# Patient Record
Sex: Male | Born: 1945 | Race: Black or African American | Hispanic: No | Marital: Married | Smoking: Heavy tobacco smoker
Health system: Southern US, Community
[De-identification: ages and names within clinical notes are randomized; demographics above are authoritative.]

## PROBLEM LIST (undated history)

## (undated) DIAGNOSIS — I428 Other cardiomyopathies: Secondary | ICD-10-CM

## (undated) DIAGNOSIS — I519 Heart disease, unspecified: Secondary | ICD-10-CM

## (undated) DIAGNOSIS — I509 Heart failure, unspecified: Secondary | ICD-10-CM

## (undated) DIAGNOSIS — I4729 Other ventricular tachycardia: Secondary | ICD-10-CM

## (undated) DIAGNOSIS — I472 Ventricular tachycardia: Secondary | ICD-10-CM

## (undated) HISTORY — DX: Heart disease, unspecified: I51.9

## (undated) HISTORY — PX: ANKLE FRACTURE SURGERY: SHX122

## (undated) HISTORY — DX: Other ventricular tachycardia: I47.29

## (undated) HISTORY — DX: Other cardiomyopathies: I42.8

## (undated) HISTORY — DX: Ventricular tachycardia: I47.2

## (undated) HISTORY — PX: KNEE SURGERY: SHX244

---

## 2009-07-10 ENCOUNTER — Emergency Department (HOSPITAL_COMMUNITY): Admission: EM | Admit: 2009-07-10 | Discharge: 2009-07-10 | Payer: Self-pay | Admitting: Family Medicine

## 2009-10-21 ENCOUNTER — Emergency Department (HOSPITAL_COMMUNITY): Admission: EM | Admit: 2009-10-21 | Discharge: 2009-10-21 | Payer: Self-pay | Admitting: Emergency Medicine

## 2010-09-23 LAB — POCT I-STAT, CHEM 8
Calcium, Ion: 1.1 mmol/L — ABNORMAL LOW (ref 1.12–1.32)
Chloride: 98 mEq/L (ref 96–112)
Creatinine, Ser: 0.9 mg/dL (ref 0.4–1.5)
Glucose, Bld: 123 mg/dL — ABNORMAL HIGH (ref 70–99)
HCT: 43 % (ref 39.0–52.0)
Hemoglobin: 14.6 g/dL (ref 13.0–17.0)
Potassium: 4.2 mEq/L (ref 3.5–5.1)

## 2010-09-23 LAB — URINALYSIS, ROUTINE W REFLEX MICROSCOPIC
Nitrite: NEGATIVE
Protein, ur: NEGATIVE mg/dL
Specific Gravity, Urine: 1.01 (ref 1.005–1.030)
Urobilinogen, UA: 0.2 mg/dL (ref 0.0–1.0)

## 2010-09-23 LAB — URINE MICROSCOPIC-ADD ON

## 2010-10-28 ENCOUNTER — Emergency Department (HOSPITAL_COMMUNITY)
Admission: EM | Admit: 2010-10-28 | Discharge: 2010-10-28 | Disposition: A | Discharge status: Home / Self Care | Payer: No Typology Code available for payment source | Attending: Emergency Medicine | Admitting: Emergency Medicine

## 2010-10-28 DIAGNOSIS — S20219A Contusion of unspecified front wall of thorax, initial encounter: Secondary | ICD-10-CM | POA: Insufficient documentation

## 2010-10-28 DIAGNOSIS — IMO0002 Reserved for concepts with insufficient information to code with codable children: Secondary | ICD-10-CM | POA: Insufficient documentation

## 2010-10-28 DIAGNOSIS — K219 Gastro-esophageal reflux disease without esophagitis: Secondary | ICD-10-CM | POA: Insufficient documentation

## 2010-10-28 DIAGNOSIS — R079 Chest pain, unspecified: Secondary | ICD-10-CM | POA: Insufficient documentation

## 2011-08-09 ENCOUNTER — Emergency Department (HOSPITAL_COMMUNITY)
Admission: EM | Admit: 2011-08-09 | Discharge: 2011-08-09 | Disposition: A | Discharge status: Home / Self Care | Payer: Medicare Other | Attending: Emergency Medicine | Admitting: Emergency Medicine

## 2011-08-09 ENCOUNTER — Encounter (HOSPITAL_COMMUNITY): Payer: Self-pay | Admitting: Emergency Medicine

## 2011-08-09 DIAGNOSIS — R300 Dysuria: Secondary | ICD-10-CM | POA: Diagnosis not present

## 2011-08-09 DIAGNOSIS — R399 Unspecified symptoms and signs involving the genitourinary system: Secondary | ICD-10-CM | POA: Insufficient documentation

## 2011-08-09 DIAGNOSIS — R339 Retention of urine, unspecified: Secondary | ICD-10-CM | POA: Insufficient documentation

## 2011-08-09 DIAGNOSIS — R109 Unspecified abdominal pain: Secondary | ICD-10-CM | POA: Diagnosis not present

## 2011-08-09 LAB — URINALYSIS, ROUTINE W REFLEX MICROSCOPIC
Bilirubin Urine: NEGATIVE
Hgb urine dipstick: NEGATIVE
Ketones, ur: NEGATIVE mg/dL
Nitrite: NEGATIVE
Specific Gravity, Urine: 1.02 (ref 1.005–1.030)
pH: 6 (ref 5.0–8.0)

## 2011-08-09 MED ORDER — TAMSULOSIN HCL 0.4 MG PO CAPS: 0.4000 mg | ORAL_CAPSULE | Freq: Every day | ORAL | Status: DC

## 2011-08-09 NOTE — ED Provider Notes (Signed)
History     CSN: [id number]  Arrival date & time 08/09/11  0650   First MD Initiated Contact with Patient 08/09/11 [phone]      Chief Complaint  Patient presents with  . Urinary Incontinence    (Consider location/radiation/quality/duration/timing/severity/associated sxs/prior treatment) The history is provided by the patient.   the patient is a 41, male, with no past medical history, who takes no medications.  He presents to emergency department complaining of insidious onset of decreased urinary output.  He also has dysuria.  He says he has decreased force of stream.  He denies nausea, vomiting, fevers, chills, or hematuria.  He denies back pain, or weakness in his legs.  Past Medical History  Diagnosis Date  . Urinary incontinence     Past Surgical History  Procedure Date  . Knee surgery   . Ankle fracture surgery     History reviewed. No pertinent family history.  History  Substance Use Topics  . Smoking status: Current Everyday Smoker  . Smokeless tobacco: Not on file  . Alcohol Use: No      Review of Systems  Constitutional: Negative for fever and chills.  Gastrointestinal: Negative for nausea, vomiting and abdominal pain.  Genitourinary: Positive for dysuria and decreased urine volume. Negative for hematuria.  Musculoskeletal: Negative for back pain.  Neurological: Negative for weakness and numbness.  Psychiatric/Behavioral: Negative for confusion.  All other systems reviewed and are negative.    Allergies  Review of patient's allergies indicates no known allergies.  Home Medications  No current outpatient prescriptions on file.  BP 170/84  Pulse 77  Temp(Src) 98.4 F (36.9 C) (Oral)  Resp 16  SpO2 98%  Physical Exam  Vitals reviewed. Constitutional: He is oriented to person, place, and time. He appears well-developed and well-nourished.  HENT:  Head: Normocephalic.  Eyes: Conjunctivae are normal. Pupils are equal, round, and reactive to light.    Neck: Normal range of motion. Neck supple.  Pulmonary/Chest: Effort normal.  Abdominal: Soft. He exhibits no distension. There is tenderness. There is no rebound and no guarding.       Mild suprapubic tenderness  Musculoskeletal: Normal range of motion. He exhibits no edema.  Neurological: He is alert and oriented to person, place, and time.  Skin: Skin is warm and dry.  Psychiatric: He has a normal mood and affect.    ED Course  Procedures (including critical care time) 66 year old male, with no past medical history presents with decreased urine output, decreased force of urinary stream, and dysuria.  I suspect that he had a urinary tract infection or BPH.  We will perform urine analysis and catheterize him to see if there is any urinary retention.   Labs Reviewed  URINALYSIS, ROUTINE W REFLEX MICROSCOPIC   No results found.   No diagnosis found.  Foley > 1000 ml UOP Will convert to leg bag and refer to urology   MDM  Urinary retention No uti. sxs relieved in ed.        Nicholes Stairs, MD 08/09/11 [phone]

## 2011-08-09 NOTE — ED Notes (Signed)
Patient complaining of urinary incontinence that started yesterday.  Patient states that he had a similar incidence around two years ago; does not remember the cause/diagnosis.  Patient reports burning during urination.  Denies any problems with prostate or past history of UTIs.

## 2011-08-09 NOTE — ED Notes (Signed)
Family at bedside. 

## 2011-08-09 NOTE — ED Notes (Signed)
Pt c/o urinary incontinence and burning w/urination starting yesterday at 12p, pt c/o lower bad pain and distended abd, sts "I just feel full."

## 2011-08-17 DIAGNOSIS — N402 Nodular prostate without lower urinary tract symptoms: Secondary | ICD-10-CM | POA: Diagnosis not present

## 2011-08-17 DIAGNOSIS — N400 Enlarged prostate without lower urinary tract symptoms: Secondary | ICD-10-CM | POA: Diagnosis not present

## 2011-08-17 DIAGNOSIS — R972 Elevated prostate specific antigen [PSA]: Secondary | ICD-10-CM | POA: Diagnosis not present

## 2011-08-17 DIAGNOSIS — R339 Retention of urine, unspecified: Secondary | ICD-10-CM | POA: Diagnosis not present

## 2011-09-11 DIAGNOSIS — R972 Elevated prostate specific antigen [PSA]: Secondary | ICD-10-CM | POA: Diagnosis not present

## 2011-09-11 DIAGNOSIS — N400 Enlarged prostate without lower urinary tract symptoms: Secondary | ICD-10-CM | POA: Diagnosis not present

## 2011-09-11 DIAGNOSIS — R339 Retention of urine, unspecified: Secondary | ICD-10-CM | POA: Diagnosis not present

## 2011-09-11 DIAGNOSIS — N402 Nodular prostate without lower urinary tract symptoms: Secondary | ICD-10-CM | POA: Diagnosis not present

## 2011-09-11 DIAGNOSIS — N320 Bladder-neck obstruction: Secondary | ICD-10-CM | POA: Diagnosis not present

## 2012-01-11 DIAGNOSIS — R972 Elevated prostate specific antigen [PSA]: Secondary | ICD-10-CM | POA: Diagnosis not present

## 2012-01-11 DIAGNOSIS — R339 Retention of urine, unspecified: Secondary | ICD-10-CM | POA: Diagnosis not present

## 2012-01-11 DIAGNOSIS — N400 Enlarged prostate without lower urinary tract symptoms: Secondary | ICD-10-CM | POA: Diagnosis not present

## 2013-03-31 DIAGNOSIS — N402 Nodular prostate without lower urinary tract symptoms: Secondary | ICD-10-CM | POA: Diagnosis not present

## 2013-03-31 DIAGNOSIS — R972 Elevated prostate specific antigen [PSA]: Secondary | ICD-10-CM | POA: Diagnosis not present

## 2013-03-31 DIAGNOSIS — N400 Enlarged prostate without lower urinary tract symptoms: Secondary | ICD-10-CM | POA: Diagnosis not present

## 2014-07-25 DIAGNOSIS — R972 Elevated prostate specific antigen [PSA]: Secondary | ICD-10-CM | POA: Diagnosis not present

## 2014-07-25 DIAGNOSIS — N401 Enlarged prostate with lower urinary tract symptoms: Secondary | ICD-10-CM | POA: Diagnosis not present

## 2014-07-25 DIAGNOSIS — R3912 Poor urinary stream: Secondary | ICD-10-CM | POA: Diagnosis not present

## 2014-07-25 DIAGNOSIS — N400 Enlarged prostate without lower urinary tract symptoms: Secondary | ICD-10-CM | POA: Diagnosis not present

## 2014-09-03 ENCOUNTER — Other Ambulatory Visit (HOSPITAL_COMMUNITY): Payer: Self-pay

## 2014-09-03 ENCOUNTER — Inpatient Hospital Stay (HOSPITAL_COMMUNITY): Payer: Medicare Other

## 2014-09-03 ENCOUNTER — Inpatient Hospital Stay (HOSPITAL_COMMUNITY)
Admission: EM | Admit: 2014-09-03 | Discharge: 2014-09-07 | DRG: 280 | Disposition: A | Discharge status: Home / Self Care | Payer: Medicare Other | Attending: Cardiology | Admitting: Cardiology

## 2014-09-03 ENCOUNTER — Emergency Department (HOSPITAL_COMMUNITY): Payer: Medicare Other

## 2014-09-03 ENCOUNTER — Encounter (HOSPITAL_COMMUNITY): Payer: Self-pay | Admitting: Emergency Medicine

## 2014-09-03 DIAGNOSIS — Z720 Tobacco use: Secondary | ICD-10-CM | POA: Diagnosis present

## 2014-09-03 DIAGNOSIS — I420 Dilated cardiomyopathy: Secondary | ICD-10-CM

## 2014-09-03 DIAGNOSIS — E785 Hyperlipidemia, unspecified: Secondary | ICD-10-CM | POA: Diagnosis present

## 2014-09-03 DIAGNOSIS — I472 Ventricular tachycardia: Secondary | ICD-10-CM | POA: Diagnosis present

## 2014-09-03 DIAGNOSIS — R32 Unspecified urinary incontinence: Secondary | ICD-10-CM | POA: Diagnosis present

## 2014-09-03 DIAGNOSIS — R0602 Shortness of breath: Secondary | ICD-10-CM | POA: Diagnosis not present

## 2014-09-03 DIAGNOSIS — I10 Essential (primary) hypertension: Secondary | ICD-10-CM | POA: Diagnosis present

## 2014-09-03 DIAGNOSIS — F17210 Nicotine dependence, cigarettes, uncomplicated: Secondary | ICD-10-CM | POA: Diagnosis present

## 2014-09-03 DIAGNOSIS — I214 Non-ST elevation (NSTEMI) myocardial infarction: Principal | ICD-10-CM | POA: Diagnosis present

## 2014-09-03 DIAGNOSIS — Z91041 Radiographic dye allergy status: Secondary | ICD-10-CM

## 2014-09-03 DIAGNOSIS — R7989 Other specified abnormal findings of blood chemistry: Secondary | ICD-10-CM

## 2014-09-03 DIAGNOSIS — Z7982 Long term (current) use of aspirin: Secondary | ICD-10-CM

## 2014-09-03 DIAGNOSIS — I5021 Acute systolic (congestive) heart failure: Secondary | ICD-10-CM | POA: Diagnosis present

## 2014-09-03 DIAGNOSIS — R778 Other specified abnormalities of plasma proteins: Secondary | ICD-10-CM | POA: Diagnosis present

## 2014-09-03 DIAGNOSIS — R200 Anesthesia of skin: Secondary | ICD-10-CM | POA: Diagnosis not present

## 2014-09-03 DIAGNOSIS — I409 Acute myocarditis, unspecified: Secondary | ICD-10-CM | POA: Diagnosis present

## 2014-09-03 DIAGNOSIS — I429 Cardiomyopathy, unspecified: Secondary | ICD-10-CM | POA: Diagnosis not present

## 2014-09-03 DIAGNOSIS — J9811 Atelectasis: Secondary | ICD-10-CM | POA: Diagnosis not present

## 2014-09-03 DIAGNOSIS — I493 Ventricular premature depolarization: Secondary | ICD-10-CM | POA: Diagnosis present

## 2014-09-03 DIAGNOSIS — R42 Dizziness and giddiness: Secondary | ICD-10-CM | POA: Diagnosis not present

## 2014-09-03 DIAGNOSIS — R0600 Dyspnea, unspecified: Secondary | ICD-10-CM | POA: Diagnosis not present

## 2014-09-03 LAB — CBC
HEMATOCRIT: 37.9 % — AB (ref 39.0–52.0)
Hemoglobin: 12.6 g/dL — ABNORMAL LOW (ref 13.0–17.0)
MCH: 28.7 pg (ref 26.0–34.0)
MCHC: 33.2 g/dL (ref 30.0–36.0)
MCV: 86.3 fL (ref 78.0–100.0)
PLATELETS: 175 10*3/uL (ref 150–400)
RBC: 4.39 MIL/uL (ref 4.22–5.81)
RDW: 13.5 % (ref 11.5–15.5)
WBC: 5.6 10*3/uL (ref 4.0–10.5)

## 2014-09-03 LAB — DIFFERENTIAL
BASOS ABS: 0 10*3/uL (ref 0.0–0.1)
Basophils Relative: 1 % (ref 0–1)
EOS PCT: 2 % (ref 0–5)
Eosinophils Absolute: 0.1 10*3/uL (ref 0.0–0.7)
LYMPHS PCT: 27 % (ref 12–46)
Lymphs Abs: 1.5 10*3/uL (ref 0.7–4.0)
MONO ABS: 0.5 10*3/uL (ref 0.1–1.0)
MONOS PCT: 9 % (ref 3–12)
NEUTROS ABS: 3.4 10*3/uL (ref 1.7–7.7)
Neutrophils Relative %: 61 % (ref 43–77)

## 2014-09-03 LAB — COMPREHENSIVE METABOLIC PANEL
ALK PHOS: 67 U/L (ref 39–117)
ALT: 13 U/L (ref 0–53)
AST: 22 U/L (ref 0–37)
Albumin: 3.3 g/dL — ABNORMAL LOW (ref 3.5–5.2)
Anion gap: 8 (ref 5–15)
BUN: 11 mg/dL (ref 6–23)
CALCIUM: 8.6 mg/dL (ref 8.4–10.5)
CHLORIDE: 110 mmol/L (ref 96–112)
CO2: 21 mmol/L (ref 19–32)
Creatinine, Ser: 1 mg/dL (ref 0.50–1.35)
GFR calc Af Amer: 87 mL/min — ABNORMAL LOW (ref >90)
GFR, EST NON AFRICAN AMERICAN: 75 mL/min — AB (ref >90)
GLUCOSE: 135 mg/dL — AB (ref 70–99)
Potassium: 3.7 mmol/L (ref 3.5–5.1)
Sodium: 139 mmol/L (ref 135–145)
Total Bilirubin: 0.8 mg/dL (ref 0.3–1.2)
Total Protein: 6.9 g/dL (ref 6.0–8.3)

## 2014-09-03 LAB — I-STAT TROPONIN, ED: Troponin i, poc: 1.43 ng/mL (ref 0.00–0.08)

## 2014-09-03 LAB — PROTIME-INR
INR: 1.01 (ref 0.00–1.49)
PROTHROMBIN TIME: 13.4 s (ref 11.6–15.2)

## 2014-09-03 LAB — CBG MONITORING, ED
GLUCOSE-CAPILLARY: 128 mg/dL — AB (ref 70–99)
Glucose-Capillary: 163 mg/dL — ABNORMAL HIGH (ref 70–99)

## 2014-09-03 LAB — APTT: APTT: 29 s (ref 24–37)

## 2014-09-03 MED ORDER — ASPIRIN 81 MG PO CHEW
324.0000 mg | CHEWABLE_TABLET | Freq: Once | ORAL | Status: AC
  Administered 2014-09-03: 324 mg via ORAL
  Filled 2014-09-03: qty 4

## 2014-09-03 MED ORDER — ASPIRIN 300 MG RE SUPP
300.0000 mg | RECTAL | Status: AC
  Filled 2014-09-03: qty 1

## 2014-09-03 MED ORDER — ASPIRIN EC 81 MG PO TBEC
81.0000 mg | DELAYED_RELEASE_TABLET | Freq: Every day | ORAL | Status: DC
  Administered 2014-09-04 – 2014-09-06 (×3): 81 mg via ORAL
  Filled 2014-09-03 (×4): qty 1

## 2014-09-03 MED ORDER — HEPARIN (PORCINE) IN NACL 100-0.45 UNIT/ML-% IJ SOLN
1400.0000 [IU]/h | INTRAMUSCULAR | Status: DC
  Administered 2014-09-03: 950 [IU]/h via INTRAVENOUS
  Filled 2014-09-03 (×3): qty 250

## 2014-09-03 MED ORDER — FINASTERIDE 5 MG PO TABS
5.0000 mg | ORAL_TABLET | Freq: Every day | ORAL | Status: DC
  Administered 2014-09-04 – 2014-09-07 (×4): 5 mg via ORAL
  Filled 2014-09-03 (×4): qty 1

## 2014-09-03 MED ORDER — ONDANSETRON HCL 4 MG/2ML IJ SOLN
4.0000 mg | Freq: Once | INTRAMUSCULAR | Status: AC
  Administered 2014-09-03: 4 mg via INTRAVENOUS
  Filled 2014-09-03: qty 2

## 2014-09-03 MED ORDER — ONDANSETRON HCL 4 MG/2ML IJ SOLN
4.0000 mg | Freq: Four times a day (QID) | INTRAMUSCULAR | Status: DC | PRN
  Administered 2014-09-04: 4 mg via INTRAVENOUS
  Filled 2014-09-03: qty 2

## 2014-09-03 MED ORDER — NITROGLYCERIN 0.4 MG SL SUBL: 0.4000 mg | SUBLINGUAL_TABLET | SUBLINGUAL | Status: DC | PRN

## 2014-09-03 MED ORDER — NICOTINE 21 MG/24HR TD PT24
21.0000 mg | MEDICATED_PATCH | Freq: Every day | TRANSDERMAL | Status: DC
  Administered 2014-09-04 – 2014-09-06 (×3): 21 mg via TRANSDERMAL
  Filled 2014-09-03 (×4): qty 1

## 2014-09-03 MED ORDER — METOPROLOL TARTRATE 12.5 MG HALF TABLET
12.5000 mg | ORAL_TABLET | Freq: Two times a day (BID) | ORAL | Status: DC
  Administered 2014-09-04: 12.5 mg via ORAL
  Filled 2014-09-03 (×3): qty 1

## 2014-09-03 MED ORDER — ATORVASTATIN CALCIUM 80 MG PO TABS
80.0000 mg | ORAL_TABLET | Freq: Every day | ORAL | Status: DC
  Administered 2014-09-05 – 2014-09-06 (×2): 80 mg via ORAL
  Filled 2014-09-03 (×4): qty 1

## 2014-09-03 MED ORDER — HEPARIN BOLUS VIA INFUSION
4000.0000 [IU] | Freq: Once | INTRAVENOUS | Status: AC
  Administered 2014-09-03: 4000 [IU] via INTRAVENOUS
  Filled 2014-09-03: qty 4000

## 2014-09-03 MED ORDER — ACETAMINOPHEN 325 MG PO TABS: 650.0000 mg | ORAL_TABLET | ORAL | Status: DC | PRN

## 2014-09-03 MED ORDER — ASPIRIN 81 MG PO CHEW: 324.0000 mg | CHEWABLE_TABLET | ORAL | Status: AC

## 2014-09-03 NOTE — ED Notes (Signed)
Cardiologist at bedside.  

## 2014-09-03 NOTE — ED Notes (Signed)
EKG hand delivered to Dr. Vanita Panda and this RN informed him of the patients critical troponin of 1.43.

## 2014-09-03 NOTE — ED Notes (Signed)
Critical troponin results: 1.43

## 2014-09-03 NOTE — Progress Notes (Signed)
ANTICOAGULATION CONSULT NOTE - Initial Consult  Pharmacy Consult for heparin Indication: chest pain/ACS  No Known Allergies  Patient Measurements: Height: 5\' 7"  (170.2 cm) Weight: 175 lb (79.379 kg) IBW/kg (Calculated) : 66.1 Heparin Dosing Weight: 79 kg  Vital Signs: Temp: 97.8 F (36.6 C) (02/29 2210) Temp Source: Oral (02/29 2104) BP: 142/86 mmHg (02/29 2104) Pulse Rate: 96 (02/29 2104)  Labs:  Recent Labs  09/03/14 2104  HGB 12.6*  HCT 37.9*  PLT 175  APTT 29  LABPROT 13.4  INR 1.01  CREATININE 1.00    Estimated Creatinine Clearance: 70.4 mL/min (by C-G formula based on Cr of 1).   Medical History: Past Medical History  Diagnosis Date  . Urinary incontinence     Medications:  See EMR  Assessment: Presents with L arm numbess, dizziness.  Initiating heparin gtt for rule out ACS. Initial troponin 1.43, CBC stable and wnl, no anticoagulants PTA, SCr 1, eCrCl ~70 ml/min.   Goal of Therapy:  Heparin level 0.3-0.7 units/ml Monitor platelets by anticoagulation protocol: Yes   Plan:  Heparin 4000 units x1, then 950 units/hr Daily HL, CBC Monitor for s/sx bleeding  Hughes Better, PharmD, BCPS Clinical Pharmacist Pager: [phone] 09/03/2014 10:25 PM

## 2014-09-03 NOTE — H&P (Addendum)
Patient ID: Bryan Lamb MRN: [id number], DOB/AGE: October 23, 1945   Admit date: 09/03/2014   Primary Physician: No primary care provider on file. Primary Cardiologist: None  Pt. Profile:  79M smoker who presents with NSTEMI.   Problem List  Past Medical History  Diagnosis Date  . Urinary incontinence     Past Surgical History  Procedure Laterality Date  . Knee surgery    . Ankle fracture surgery       Allergies  No Known Allergies  HPI  79M smoker who presents with NSTEMI.   Bryan Lamb reports that today, "all of a sudden," he felt "real dizzy" while watching TV. He noted associated L arm numbness, hand tremors, and SOB. No CP or chest pressure or palpitations. No weakness, dysphagia, facial droop, syncope. He presented to the ER for these symptoms.   On arrival to the ER, he was hemodynamically stable. He developed nausea and ended up vomiting 3x. Labs were notable for Cr 1.0, K 3.7, POC TnI 1.43. ECG demonstrated NSR, LAE, anterior Q waves, diffuse STTW abnormalities with inverted Tw in lateral and inferior leads, prolonged QT. He was given 324mg  ASA and started on a heparin drip. Cardiology was consulted.   Of note, he is a 2ppd smoker x 30 years. Lives with his wife. Has 2 adult children. Works 2d/week at an [employer] and walks around a lot and has never been limited by CP, SOB, or claudication. He has a brother who had CABG at the age of 1 and thinks that he may have other siblings with heart disease as well.    Home Medications  Prior to Admission medications   Medication Sig Start Date End Date Taking? Authorizing Provider  finasteride (PROSCAR) 5 MG tablet Take 5 mg by mouth daily. 07/05/14  Yes Historical Provider, MD  Tamsulosin HCl (FLOMAX) 0.4 MG CAPS Take 1 capsule (0.4 mg total) by mouth daily after breakfast. 08/09/11  Yes Barbara Cower, MD    Family History  History reviewed. No pertinent family history.  Social History  History   Social  History  . Marital Status: Married    Spouse Name: N/A  . Number of Children: N/A  . Years of Education: N/A   Occupational History  . Not on file.   Social History Main Topics  . Smoking status: Current Every Day Smoker  . Smokeless tobacco: Not on file  . Alcohol Use: No  . Drug Use: No  . Sexual Activity: Not on file   Other Topics Concern  . Not on file   Social History Narrative     Review of Systems General:  No chills, fever, night sweats or weight changes.  Cardiovascular:  See HPI Dermatological: No rash, lesions/masses Respiratory: No cough, dyspnea Urologic: No hematuria, dysuria Abdominal:   + nausea, vomiting. No diarrhea, bright red blood per rectum, melena, or hematemesis Neurologic:  No visual changes, wkns, changes in mental status. All other systems reviewed and are otherwise negative except as noted above.  Physical Exam  Blood pressure 142/86, pulse 96, temperature 97.8 F (36.6 C), temperature source Oral, resp. rate 16, height 5\' 7"  (1.702 m), weight 79.379 kg (175 lb), SpO2 95 %.  General: Pleasant, NAD Psych: Normal affect. Neuro: Alert and oriented X 3. Moves all extremities spontaneously. HEENT: Normal  Neck: Supple without bruits or JVD. Lungs:  Resp regular and unlabored, bibasilar crackles. Heart: RRR no s3, s4, or murmurs. Abdomen: Soft, non-tender, non-distended, BS + x 4.  Extremities:  No clubbing, cyanosis or edema. DP/PT/Radials 2+ and equal bilaterally.  Labs  Troponin [facility] of Care Test)  Recent Labs  09/25/14 2118  TROPIPOC 1.43*   No results for input(s): CKTOTAL, CKMB, TROPONINI in the last 72 hours. Lab Results  Component Value Date   WBC 5.6 09-25-2014   HGB 12.6* September 25, 2014   HCT 37.9* 09/25/14   MCV 86.3 2014-09-25   PLT 175 September 25, 2014    Recent Labs Lab 25-Sep-2014 2104  NA 139  K 3.7  CL 110  CO2 21  BUN 11  CREATININE 1.00  CALCIUM 8.6  PROT 6.9  BILITOT 0.8  ALKPHOS 67  ALT 13  AST 22    GLUCOSE 135*   No results found for: CHOL, HDL, LDLCALC, TRIG No results found for: DDIMER   Radiology/Studies  No results found.  ECG  25-Sep-2014 @ 21:37: NSR, LAE, anterior Q waves, diffuse STTW abnormalities with inverted Tw in lateral and inferior leads, prolonged QT   ASSESSMENT AND PLAN  48M smoker who presents with NSTEMI. He has multiple risk factors for CAD including extensive smoking history and an ECG with evidence of prior infarct and diffuse repolarization abnormalities. He is high risk for multivessel CAD.   1. UFH 2. ASA 3. Atorva 80mg  4. Metoprolol 12.5mg  BID 5. TTE 6. A1c, Lipids, TSH 7. cylce troponins 8. NPO s/p MN for cath 9. Nicotine patch and smoking cessation counseling  Signed, Lamar Sprinkles, MD 09-25-14, 10:07 PM

## 2014-09-03 NOTE — ED Provider Notes (Signed)
CSN: [id number]     Arrival date & time 09/03/14  2042 History   First MD Initiated Contact with Patient 09/03/14 2147     Chief Complaint  Patient presents with  . Numbness    The patient said he started having numbness in his left arm and then he got dizzy.  The patient said he threw up when he got to the waiting room.    . Dizziness     (Consider location/radiation/quality/duration/timing/severity/associated sxs/prior Treatment) HPI   Patient presents with concern of left arm numbness, mild lightheadedness, nausea. Symptoms began about 2 hours prior to ED arrival. Initially the patient states that his left arm was shaking.  Subsequently the patient developed tingling in both his left and right upper extremities. There was no pain at that point, nor subsequently, the patient developed mild lightheadedness. No difficulty breathing, abdominal discomfort, or syncope. However, with lightheadedness, and his tingling in his arms patient came to the emergency department.  Soon thereafter the patient had several episodes of emesis. I initially saw the patient in triage, given his tingling in his arms. Patient had no focal neuro complaints, beyond tingling and was awake and alert, with no pain at that point. Prior to the onset of symptoms the patient was in his usual state of health, and since onset, no clear interventions have occurred to change his symptoms.   Past Medical History  Diagnosis Date  . Urinary incontinence    Past Surgical History  Procedure Laterality Date  . Knee surgery    . Ankle fracture surgery     History reviewed. No pertinent family history. History  Substance Use Topics  . Smoking status: Current Every Day Smoker  . Smokeless tobacco: Not on file  . Alcohol Use: No    Review of Systems  Constitutional:       Per HPI, otherwise negative  HENT:       Per HPI, otherwise negative  Respiratory:       Per HPI, otherwise negative  Cardiovascular:        Per HPI, otherwise negative  Gastrointestinal: Negative for vomiting.  Endocrine:       Negative aside from HPI  Genitourinary:       Neg aside from HPI   Musculoskeletal:       Per HPI, otherwise negative  Skin: Negative.   Neurological: Positive for light-headedness. Negative for syncope and weakness.      Allergies  Review of patient's allergies indicates no known allergies.  Home Medications   Prior to Admission medications   Medication Sig Start Date End Date Taking? Authorizing Provider  finasteride (PROSCAR) 5 MG tablet Take 5 mg by mouth daily. 07/05/14  Yes Historical Provider, MD  Tamsulosin HCl (FLOMAX) 0.4 MG CAPS Take 1 capsule (0.4 mg total) by mouth daily after breakfast. 08/09/11  Yes Barbara Cower, MD   BP 142/86 mmHg  Pulse 96  Temp(Src) 97.8 F (36.6 C) (Oral)  Resp 16  Ht 5\' 7"  (1.702 m)  Wt 175 lb (79.379 kg)  BMI 27.40 kg/m2  SpO2 95% Physical Exam  Constitutional: He is oriented to person, place, and time. He appears well-developed. No distress.  HENT:  Head: Normocephalic and atraumatic.  Eyes: Conjunctivae and EOM are normal.  Cardiovascular: Normal rate and regular rhythm.   Pulmonary/Chest: Effort normal. No stridor. No respiratory distress.  Abdominal: He exhibits no distension.  Musculoskeletal: He exhibits no edema.  Neurological: He is alert and oriented to person, place, and time.  He displays no tremor. No cranial nerve deficit. He exhibits normal muscle tone. He displays no seizure activity. Coordination normal.  No appreciable neurologic deficiency  Skin: Skin is warm and dry.  Psychiatric: He has a normal mood and affect. His behavior is normal.  Nursing note and vitals reviewed.   ED Course  Procedures (including critical care time) Labs Review Labs Reviewed  CBC - Abnormal; Notable for the following:    Hemoglobin 12.6 (*)    HCT 37.9 (*)    All other components within normal limits  COMPREHENSIVE METABOLIC PANEL -  Abnormal; Notable for the following:    Glucose, Bld 135 (*)    Albumin 3.3 (*)    GFR calc non Af Amer 75 (*)    GFR calc Af Amer 87 (*)    All other components within normal limits  CBG MONITORING, ED - Abnormal; Notable for the following:    Glucose-Capillary 128 (*)    All other components within normal limits  I-STAT TROPOININ, ED - Abnormal; Notable for the following:    Troponin i, poc 1.43 (*)    All other components within normal limits  PROTIME-INR  APTT  DIFFERENTIAL    Imaging Review I reviewed the x-ray on the portable machine after was performed.  I agree with the interpretation.    I reviewed the x-ray.  EKG Interpretation   Date/Time:  Monday September 03 2014 21:38:54 EST Ventricular Rate:  86 PR Interval:  133 QRS Duration: 111 QT Interval:  465 QTC Calculation: 556 R Axis:   17 Text Interpretation:  Sinus rhythm Probable left atrial enlargement Left  ventricular hypertrophy Anterior infarct, old Abnormal T, consider  ischemia, diffuse leads Minimal ST elevation, lateral leads Prolonged QT  interval Sinus rhythm Artifact ST-t wave abnormality Left ventricular  hypertrophy Abnormal ekg Confirmed by Carmin Muskrat  MD ([id number]) on  09/03/2014 9:47:31 PM       9:58 PM Patient in no distress.  Patient's wife and him are aware of elevated troponin.  Update: I discussed patient's case with cardiology. We reviewed the EKG, ST segment changes in the lateral chest leads.  Patient will be started on heparin drip. Patient's mediastinum is not wide.  Patient tolerating heparin initiation.  Update patient at CT   MDM  Patient presents with new dysesthesia in both extremities, primarily on the left, as well as lightheadedness. Subsequently, the patient developed nausea, has multiple episodes of emesis. Throughout the sensation no chest pain, but the patient's EKG was abnormal suggesting coronary disease, and the patient had elevated troponin at  1.4.  With concern for atypical angina equivalent, abnormal EKG, elevated troponin, patient received heparin drip.  Patient tolerated well was admitted to our cardiology team for unstable angina.  CRITICAL CARE Performed by: Carmin Muskrat Total critical care time: 35 Critical care time was exclusive of separately billable procedures and treating other patients. Critical care was necessary to treat or prevent imminent or life-threatening deterioration. Critical care was time spent personally by me on the following activities: development of treatment plan with patient and/or surrogate as well as nursing, discussions with consultants, evaluation of patient's response to treatment, examination of patient, obtaining history from patient or surrogate, ordering and performing treatments and interventions, ordering and review of laboratory studies, ordering and review of radiographic studies, pulse oximetry and re-evaluation of patient's condition.     Carmin Muskrat, MD 09/04/14 0020

## 2014-09-03 NOTE — ED Notes (Signed)
Pt reports the numbness and tingling is gone.

## 2014-09-03 NOTE — ED Notes (Signed)
Dr.Lockwood at bedside  

## 2014-09-03 NOTE — ED Notes (Signed)
The patient said he started having numbness in his left arm and then he got dizzy.  The patient said he threw up when he got to the waiting room.  He says he could not walk so he yelled for his wife.  Did neuro check and it was negative.  GCS-15

## 2014-09-03 NOTE — ED Notes (Signed)
Portable Chest x-ray at bedside

## 2014-09-04 ENCOUNTER — Encounter (HOSPITAL_COMMUNITY)
Admission: EM | Disposition: A | Discharge status: Home / Self Care | Payer: Self-pay | Source: Home / Self Care | Attending: Cardiology

## 2014-09-04 DIAGNOSIS — E785 Hyperlipidemia, unspecified: Secondary | ICD-10-CM

## 2014-09-04 DIAGNOSIS — Z720 Tobacco use: Secondary | ICD-10-CM | POA: Diagnosis present

## 2014-09-04 HISTORY — PX: LEFT HEART CATHETERIZATION WITH CORONARY ANGIOGRAM: SHX5451

## 2014-09-04 LAB — CBC
HCT: 37.6 % — ABNORMAL LOW (ref 39.0–52.0)
Hemoglobin: 12.2 g/dL — ABNORMAL LOW (ref 13.0–17.0)
MCH: 28.8 pg (ref 26.0–34.0)
MCHC: 32.4 g/dL (ref 30.0–36.0)
MCV: 88.7 fL (ref 78.0–100.0)
Platelets: 165 10*3/uL (ref 150–400)
RBC: 4.24 MIL/uL (ref 4.22–5.81)
RDW: 13.8 % (ref 11.5–15.5)
WBC: 5.2 10*3/uL (ref 4.0–10.5)

## 2014-09-04 LAB — PROTIME-INR
INR: 1 (ref 0.00–1.49)
Prothrombin Time: 13.3 seconds (ref 11.6–15.2)

## 2014-09-04 LAB — TSH: TSH: 0.06 u[IU]/mL — AB (ref 0.350–4.500)

## 2014-09-04 LAB — LIPID PANEL
CHOL/HDL RATIO: 4.4 ratio
Cholesterol: 161 mg/dL (ref 0–200)
HDL: 37 mg/dL — ABNORMAL LOW (ref >39)
LDL CALC: 112 mg/dL — AB (ref 0–99)
TRIGLYCERIDES: 62 mg/dL (ref <150)
VLDL: 12 mg/dL (ref 0–40)

## 2014-09-04 LAB — HEPARIN LEVEL (UNFRACTIONATED)
HEPARIN UNFRACTIONATED: 0.22 [IU]/mL — AB (ref 0.30–0.70)
Heparin Unfractionated: <0.1 IU/mL — ABNORMAL LOW (ref 0.30–0.70)

## 2014-09-04 LAB — BASIC METABOLIC PANEL
Anion gap: 7 (ref 5–15)
BUN: 10 mg/dL (ref 6–23)
CO2: 26 mmol/L (ref 19–32)
Calcium: 8.2 mg/dL — ABNORMAL LOW (ref 8.4–10.5)
Chloride: 106 mmol/L (ref 96–112)
Creatinine, Ser: 0.92 mg/dL (ref 0.50–1.35)
GFR calc Af Amer: >90 mL/min (ref >90)
GFR calc non Af Amer: 84 mL/min — ABNORMAL LOW (ref >90)
Glucose, Bld: 133 mg/dL — ABNORMAL HIGH (ref 70–99)
Potassium: 4.1 mmol/L (ref 3.5–5.1)
Sodium: 139 mmol/L (ref 135–145)

## 2014-09-04 LAB — MAGNESIUM: Magnesium: 1.8 mg/dL (ref 1.5–2.5)

## 2014-09-04 LAB — TROPONIN I
TROPONIN I: 6.48 ng/mL — AB (ref <0.031)
Troponin I: 1.68 ng/mL (ref <0.031)
Troponin I: 3.95 ng/mL (ref <0.031)

## 2014-09-04 LAB — BRAIN NATRIURETIC PEPTIDE: B Natriuretic Peptide: 770.9 pg/mL — ABNORMAL HIGH (ref 0.0–100.0)

## 2014-09-04 SURGERY — LEFT HEART CATHETERIZATION WITH CORONARY ANGIOGRAM

## 2014-09-04 MED ORDER — SODIUM CHLORIDE 0.9 % IV SOLN
INTRAVENOUS | Status: DC
  Administered 2014-09-04: 50 mL/h via INTRAVENOUS

## 2014-09-04 MED ORDER — METOPROLOL TARTRATE 25 MG PO TABS
25.0000 mg | ORAL_TABLET | Freq: Two times a day (BID) | ORAL | Status: DC
  Administered 2014-09-04 (×2): 25 mg via ORAL
  Filled 2014-09-04 (×4): qty 1

## 2014-09-04 MED ORDER — NITROGLYCERIN 1 MG/10 ML FOR IR/CATH LAB
INTRA_ARTERIAL | Status: AC
  Filled 2014-09-04: qty 10

## 2014-09-04 MED ORDER — HEPARIN BOLUS VIA INFUSION
1100.0000 [IU] | Freq: Once | INTRAVENOUS | Status: DC
  Filled 2014-09-04: qty 1100

## 2014-09-04 MED ORDER — VERAPAMIL HCL 2.5 MG/ML IV SOLN
INTRAVENOUS | Status: AC
  Filled 2014-09-04: qty 2

## 2014-09-04 MED ORDER — INFLUENZA VAC SPLIT QUAD 0.5 ML IM SUSY
0.5000 mL | PREFILLED_SYRINGE | INTRAMUSCULAR | Status: DC
  Filled 2014-09-04 (×2): qty 0.5

## 2014-09-04 MED ORDER — HEPARIN BOLUS VIA INFUSION
2000.0000 [IU] | Freq: Once | INTRAVENOUS | Status: AC
  Administered 2014-09-04: 2000 [IU] via INTRAVENOUS
  Filled 2014-09-04: qty 2000

## 2014-09-04 MED ORDER — SODIUM CHLORIDE 0.9 % IV SOLN: 250.0000 mL | INTRAVENOUS | Status: DC | PRN

## 2014-09-04 MED ORDER — HEPARIN (PORCINE) IN NACL 100-0.45 UNIT/ML-% IJ SOLN
1700.0000 [IU]/h | INTRAMUSCULAR | Status: DC
  Administered 2014-09-05 (×2): 1400 [IU]/h via INTRAVENOUS
  Filled 2014-09-04 (×3): qty 250

## 2014-09-04 MED ORDER — HEPARIN SODIUM (PORCINE) 1000 UNIT/ML IJ SOLN
INTRAMUSCULAR | Status: AC
  Filled 2014-09-04: qty 1

## 2014-09-04 MED ORDER — FUROSEMIDE 40 MG PO TABS
40.0000 mg | ORAL_TABLET | Freq: Every day | ORAL | Status: DC
  Administered 2014-09-04: 40 mg via ORAL
  Filled 2014-09-04 (×2): qty 1

## 2014-09-04 MED ORDER — SODIUM CHLORIDE 0.9 % IJ SOLN: 3.0000 mL | INTRAMUSCULAR | Status: DC | PRN

## 2014-09-04 MED ORDER — LIDOCAINE HCL (PF) 1 % IJ SOLN
INTRAMUSCULAR | Status: AC
  Filled 2014-09-04: qty 30

## 2014-09-04 MED ORDER — HEPARIN (PORCINE) IN NACL 2-0.9 UNIT/ML-% IJ SOLN
INTRAMUSCULAR | Status: AC
  Filled 2014-09-04: qty 1000

## 2014-09-04 MED ORDER — ASPIRIN 81 MG PO CHEW
81.0000 mg | CHEWABLE_TABLET | ORAL | Status: AC
  Administered 2014-09-04: 81 mg via ORAL
  Filled 2014-09-04: qty 1

## 2014-09-04 MED ORDER — SODIUM CHLORIDE 0.9 % IJ SOLN
3.0000 mL | Freq: Two times a day (BID) | INTRAMUSCULAR | Status: DC
  Administered 2014-09-05 – 2014-09-07 (×4): 3 mL via INTRAVENOUS

## 2014-09-04 MED ORDER — FUROSEMIDE 10 MG/ML IJ SOLN
INTRAMUSCULAR | Status: AC
  Filled 2014-09-04: qty 4

## 2014-09-04 MED ORDER — SODIUM CHLORIDE 0.9 % IJ SOLN
3.0000 mL | Freq: Two times a day (BID) | INTRAMUSCULAR | Status: DC
  Administered 2014-09-04: 3 mL via INTRAVENOUS

## 2014-09-04 MED ORDER — LISINOPRIL 10 MG PO TABS
10.0000 mg | ORAL_TABLET | Freq: Every day | ORAL | Status: DC
  Administered 2014-09-04: 10 mg via ORAL
  Filled 2014-09-04 (×2): qty 1

## 2014-09-04 NOTE — Progress Notes (Signed)
Troponin 1.68. Cardiologist on call paged. No new order at this time.

## 2014-09-04 NOTE — Research (Signed)
Bioflow Study Informed Consent   Subject Name: Bryan Lamb  Subject met inclusion and exclusion criteria.  The informed consent form, study requirements and expectations were reviewed with the subject and questions and concerns were addressed prior to the signing of the consent form.  The subject verbalized understanding of the trail requirements.  The subject agreed to participate in the Bioflow trial and signed the informed consent.  The informed consent was obtained prior to performance of any protocol-specific procedures for the subject.  A copy of the signed informed consent was given to the subject and a copy was placed in the subject's medical record.  [name] 09/04/2014, 11:15

## 2014-09-04 NOTE — Progress Notes (Signed)
ANTICOAGULATION CONSULT NOTE - Initial Consult  Pharmacy Consult for heparin Indication: chest pain/ACS  No Known Allergies  Patient Measurements: Height: 5\' 7"  (170.2 cm) Weight: 172 lb (78.019 kg) IBW/kg (Calculated) : 66.1 Heparin Dosing Weight: 78 kg  Vital Signs: Temp: 98.7 F (37.1 C) (03/01 0626) Temp Source: Oral (03/01 0626) BP: 125/96 mmHg (03/01 0626) Pulse Rate: 107 (03/01 0626)  Labs:  Recent Labs  09/03/14 2104 09/04/14 0004 09/04/14 0506 09/04/14 1115 09/04/14 1235  HGB 12.6*  --  12.2*  --   --   HCT 37.9*  --  37.6*  --   --   PLT 175  --  165  --   --   APTT 29  --   --   --   --   LABPROT 13.4  --  13.3  --   --   INR 1.01  --  1.00  --   --   HEPARINUNFRC  --   --  <0.10*  --  0.22*  CREATININE 1.00  --  0.92  --   --   TROPONINI  --  1.68* 3.95* 6.48*  --     Estimated Creatinine Clearance: 70.8 mL/min (by C-G formula based on Cr of 0.92).   Medical History: Past Medical History  Diagnosis Date  . Urinary incontinence     Medications:  See EMR  Assessment: Presents with L arm numbess, dizziness.  Initiating heparin gtt for rule out ACS. Initial troponin 1.43, CBC stable and wnl, no anticoagulants PTA, SCr 1, eCrCl ~70 ml/min.  Repeat HL remains SUBtherapeutic at 0.22 on heparin 1250 units/hr. No bleeding is noted. Pt is supposed to go to cath today so will just increase heparin infusion rate for now.   Goal of Therapy:  Heparin level 0.3-0.7 units/ml Monitor platelets by anticoagulation protocol: Yes   Plan:  Increase heparin to 1400 units/hr 6h HL Daily HL, CBC Monitor for s/sx bleeding  Andrey Cota. Diona Foley, PharmD Clinical Pharmacist Pager [phone] 09/04/2014 1:45 PM

## 2014-09-04 NOTE — Progress Notes (Signed)
ANTICOAGULATION CONSULT NOTE - Initial Consult  Pharmacy Consult for heparin Indication: chest pain/ACS  No Known Allergies  Patient Measurements: Height: 5\' 7"  (170.2 cm) Weight: 172 lb (78.019 kg) IBW/kg (Calculated) : 66.1 Heparin Dosing Weight: 78 kg  Vital Signs: Temp: 98.5 F (36.9 C) (03/01 1422) Temp Source: Oral (03/01 1422) BP: 148/91 mmHg (03/01 1846) Pulse Rate: 105 (03/01 1846)  Labs:  Recent Labs  09/03/14 2104 09/04/14 0004 09/04/14 0506 09/04/14 1115 09/04/14 1235  HGB 12.6*  --  12.2*  --   --   HCT 37.9*  --  37.6*  --   --   PLT 175  --  165  --   --   APTT 29  --   --   --   --   LABPROT 13.4  --  13.3  --   --   INR 1.01  --  1.00  --   --   HEPARINUNFRC  --   --  <0.10*  --  0.22*  CREATININE 1.00  --  0.92  --   --   TROPONINI  --  1.68* 3.95* 6.48*  --     Estimated Creatinine Clearance: 70.8 mL/min (by C-G formula based on Cr of 0.92).   Medical History: Past Medical History  Diagnosis Date  . Urinary incontinence     Medications:  See EMR  Assessment: Presents with L arm numbess, dizziness.  Pt on IV heparin>>cath today. Cath impression: Severe left ventricular dysfunction without significant CAD or valvular disease.The presence of cardiomegaly suggests a more chronic disorder, but the elevated cardiac enzymes suggest a more acute event. Consider acute myocarditis, Takotsubo syndrome, LV thrombembolism or even transient malignant ventricular arrhythmia. Diuretics administered for elevated LVEDP and ACE inhibitor started. Echo pending.   Goal of Therapy:  Heparin level 0.3-0.7 units/ml Monitor platelets by anticoagulation protocol: Yes   Plan:  Resume heparin 8 hrs after TR band removed (1745-2000) Heparin 1400 units/hr starting at 0400 on 3/2 Will check heparin level 6-8 hrs later. Daily heparin level and CBC thereafter.  Curran Lenderman S. Alford Highland, PharmD, [facility] Clinical Staff Pharmacist Pager [phone]   09/04/2014 7:58 PM

## 2014-09-04 NOTE — Progress Notes (Signed)
Utilization Review Completed.Bryan Lamb T3/07/2014  

## 2014-09-04 NOTE — Op Note (Signed)
CARDIAC CATHETERIZATION REPORT   Procedures performed:  1. Left heart catheterization  2. Selective coronary angiography  3. Left ventriculography   Reason for procedure:  Acute non ST segment elevation myocardial infarction    Procedure performed by: Sanda Klein, MD, [facility]  Complications: none   Estimated blood loss: less than 5 mL   History:  19M smoker who presents with NSTEMI.   Bryan Lamb reports that today, "all of a sudden," he felt "real dizzy" while watching TV. He noted associated L arm numbness, hand tremors, and SOB. No CP or chest pressure or palpitations. No weakness, dysphagia, facial droop, syncope. He presented to the ER for these symptoms.   On arrival to the ER, he was hemodynamically stable. He developed nausea and ended up vomiting 3x. Labs were notable for Cr 1.0, K 3.7, POC TnI 1.43. ECG demonstrated NSR, LAE, anterior Q waves, diffuse STTW abnormalities with inverted Tw in lateral and inferior leads, prolonged QT. Troponin I has increased further to 6.  Consent: The risks, benefits, and details of the procedure were explained to the patient. Risks including death, MI, stroke, bleeding, limb ischemia, renal failure and allergy were described and accepted by the patient. Informed written consent was obtained prior to proceeding.  Technique: The patient was brought to the cardiac catheterization laboratory in the fasting state. He was prepped and draped in the usual sterile fashion. Local anesthesia with 1% lidocaine was administered to the right wrist area. Using the modified Seldinger technique a 5 French right radial femoral artery sheath was introduced without difficulty. Under fluoroscopic guidance, using 5 [place] JL4, JR and angled pigtail catheters, selective cannulation of the left coronary artery, right coronary artery and left ventricle were respectively performed. Several coronary angiograms in a variety of projections were recorded, as well as a left  ventriculogram in the RAO projection. Left ventricular pressure and a pull back to the aorta were recorded. No immediate complications occurred. At the end of the procedure, all catheters were removed. After the procedure, hemostasis will be achieved with manual pressure.  Contrast used: 80 mL Omnipaque  Angiographic Findings:  1. The left main coronary artery is free of significant atherosclerosis and bifurcates in the usual fashion into the left anterior descending artery and left circumflex coronary artery.  2. The left anterior descending artery is a large vessel that reaches the apex and generates  Multiple major diagonal branches. There is evidence of minimal luminal irregularities and no calcification. No hemodynamically meaningful stenoses are seen. 3. The left circumflex coronary artery is a large-size vessel non dominant vessel that generates 3 major oblique marginal arteries. There is evidence of no luminal irregularities and no calcification. No hemodynamically meaningful stenoses are seen. 4. The right coronary artery is a medium-size dominant vessel that generates a posterior lateral ventricular system as well as the PDA. There is evidence of no luminal irregularities and no calcification. No hemodynamically meaningful stenoses are seen.  5. The left ventricle is dilated. The left ventricle systolic function is severely decreased with an estimated ejection fraction of <20% with global hypokinesis. Regional wall motion abnormalities are not seen. No left ventricular thrombus is seen, but the apex was not well opacified.. There is at  mild mitral insufficiency. The ascending aorta appears normal. There is no aortic valve stenosis by pullback. The left ventricular end-diastolic pressure is 40-98 mm Hg.    IMPRESSIONS/RECOMMENDATION:  Severe left ventricular dysfunction without significant CAD or valvular disease. The presence of cardiomegaly suggests a more chronic  disorder, but the elevated  cardiac enzymes suggest a more acute event. Consider acute myocarditis, Takotsubo syndrome, LV thrombembolism or even transient malignant ventricular arrhythmia. Diuretics administered for elevated LVEDP and ACE inhibitor started. Echo pending.   Sanda Klein, MD, [facility] [phone] office [phone] pager

## 2014-09-04 NOTE — Progress Notes (Signed)
SUBJECTIVE:  Denies chest pain  OBJECTIVE:   Vitals:   Filed Vitals:   09/03/14 2210 09/03/14 2245 09/03/14 2308 09/04/14 0626  BP:  148/91 147/92 125/96  Pulse:  73 99 107  Temp: 97.8 F (36.6 C)  97.9 F (36.6 C) 98.7 F (37.1 C)  TempSrc:   Oral Oral  Resp:    17  Height:   5\' 7"  (1.702 m)   Weight:   172 lb (78.019 kg)   SpO2:  96% 92% 92%   I&O's:  No intake or output data in the 24 hours ending 09/04/14 0828 TELEMETRY: Reviewed telemetry pt in NSR:     PHYSICAL EXAM General: Well developed, well nourished, in no acute distress Head: Eyes PERRLA, No xanthomas.   Normal cephalic and atramatic  Lungs:   Clear bilaterally to auscultation and percussion. Heart:   HRRR S1 S2 Pulses are 2+ & equal. Abdomen: Bowel sounds are positive, abdomen soft and non-tender without massesExtremities:   No clubbing, cyanosis or edema.  DP +1 Neuro: Alert and oriented X 3. Psych:  Good affect, responds appropriately   LABS: Basic Metabolic Panel:  Recent Labs  09/03/14 2104 09/04/14 0004 09/04/14 0506  NA 139  --  139  K 3.7  --  4.1  CL 110  --  106  CO2 21  --  26  GLUCOSE 135*  --  133*  BUN 11  --  10  CREATININE 1.00  --  0.92  CALCIUM 8.6  --  8.2*  MG  --  1.8  --    Liver Function Tests:  Recent Labs  09/03/14 2104  AST 22  ALT 13  ALKPHOS 67  BILITOT 0.8  PROT 6.9  ALBUMIN 3.3*   No results for input(s): LIPASE, AMYLASE in the last 72 hours. CBC:  Recent Labs  09/03/14 2104 09/04/14 0506  WBC 5.6 5.2  NEUTROABS 3.4  --   HGB 12.6* 12.2*  HCT 37.9* 37.6*  MCV 86.3 88.7  PLT 175 165   Cardiac Enzymes:  Recent Labs  09/04/14 0004 09/04/14 0506  TROPONINI 1.68* 3.95*   BNP: Invalid input(s): POCBNP D-Dimer: No results for input(s): DDIMER in the last 72 hours. Hemoglobin A1C: No results for input(s): HGBA1C in the last 72 hours. Fasting Lipid Panel:  Recent Labs  09/04/14 0506  CHOL 161  HDL 37*  LDLCALC 112*  TRIG 62    CHOLHDL 4.4   Thyroid Function Tests:  Recent Labs  09/04/14 0004  TSH 0.060*   Anemia Panel: No results for input(s): VITAMINB12, FOLATE, FERRITIN, TIBC, IRON, RETICCTPCT in the last 72 hours. Coag Panel:   Lab Results  Component Value Date   INR 1.00 09/04/2014   INR 1.01 09/03/2014    RADIOLOGY: Ct Head (brain) Wo Contrast  09/03/2014   CLINICAL DATA:  Initial evaluation for acute left upper extremity numbness, dizziness.  EXAM: CT HEAD WITHOUT CONTRAST  TECHNIQUE: Contiguous axial images were obtained from the base of the skull through the vertex without intravenous contrast.  COMPARISON:  None.  FINDINGS: Mild generalized cerebral atrophy present. Patchy and confluent hypodensity within the periventricular and deep white matter both cerebral hemispheres most consistent with chronic small vessel ischemic disease. Encephalomalacia within the left frontal and left parietal lobes most consistent with remote infarcts.  No acute large vessel territory infarct identified. No mass lesion or midline shift. No intracranial hemorrhage. No hydrocephalus or extra-axial fluid collection.  Scalp soft tissues within normal limits. No  acute abnormality seen about the orbits.  Calvarium intact. Paranasal sinuses and mastoid air cells are clear.  IMPRESSION: 1. No acute intracranial process. 2. Remote left frontal and parietal lobe infarcts. 3. Atrophy with chronic microvascular ischemic disease.   Electronically Signed   By: Jeannine Boga M.D.   On: 09/03/2014 23:47   Dg Chest Port 1 View  09/03/2014   CLINICAL DATA:  Acute onset of left-sided arm numbness and dizziness. Initial encounter.  EXAM: PORTABLE CHEST - 1 VIEW  COMPARISON:  None.  FINDINGS: The lungs are well-aerated. Bibasilar atelectasis is noted. Mild vascular congestion is noted. There is no evidence of pleural effusion or pneumothorax.  The cardiomediastinal silhouette is mildly enlarged. No acute osseous abnormalities are seen.   IMPRESSION: Mild vascular congestion and mild cardiomegaly. Bibasilar atelectasis noted.   Electronically Signed   By: Garald Balding M.D.   On: 09/03/2014 22:27    ASSESSMENT AND PLAN  36M smoker who presents with NSTEMI. He has multiple risk factors for CAD including extensive smoking history and an ECG with evidence of prior infarct and diffuse repolarization abnormalities. He is high risk for multivessel CAD.   1.  NSTEMI - trop now up to 3.95.  Will plan left heart catheterization today to define coronary anatomy.  Continue ASA/IV Heparin gtt/high dose statin/BB 2.  Ongoing tobacco use -smoking cessation counseling 3.  Dyslipidemia with LDL 112.  Started on high dose statin.  4.  Elevated BP - will increase lopressor to 25mg  BID  Cardiac catheterization was discussed with the patient fully including risks on myocardial infarction, death, stroke, bleeding, arrhythmia, dye allergy, renal insufficiency or bleeding.  All patient questions and concerns were discussed and the patient understands and is willing to proceed.       Sueanne Margarita, MD  09/04/2014  8:28 AM

## 2014-09-04 NOTE — Progress Notes (Signed)
ANTICOAGULATION CONSULT NOTE - Follow Up Consult  Pharmacy Consult for heparin Indication: NSTEMI  Labs:  Recent Labs  09/03/14 2104 09/04/14 0004 09/04/14 0506  HGB 12.6*  --  12.2*  HCT 37.9*  --  37.6*  PLT 175  --  165  APTT 29  --   --   LABPROT 13.4  --  13.3  INR 1.01  --  1.00  HEPARINUNFRC  --   --  <0.10*  CREATININE 1.00  --   --   TROPONINI  --  1.68*  --     Assessment: 69yo male undetectable on heparin with initial dosing for NSTEMI.  Goal of Therapy:  Heparin level 0.3-0.7 units/ml   Plan:  Will rebolus with heparin 2000units x1 and increase gtt by 4 units/kg/hr to 1250 units/hr and check level in 6hr.  Wynona Neat, PharmD, BCPS  09/04/2014,6:30 AM

## 2014-09-04 NOTE — Interval H&P Note (Signed)
History and Physical Interval Note:  09/04/2014 4:50 PM  Bryan Lamb  has presented today for surgery, with the diagnosis of cp  The various methods of treatment have been discussed with the patient and family. After consideration of risks, benefits and other options for treatment, the patient has consented to  Procedure(s): LEFT HEART CATHETERIZATION WITH CORONARY ANGIOGRAM (N/A) as a surgical intervention .  The patient's history has been reviewed, patient examined, no change in status, stable for surgery.  I have reviewed the patient's chart and labs.  Questions were answered to the patient's satisfaction.     Georgia Baria

## 2014-09-05 ENCOUNTER — Inpatient Hospital Stay (HOSPITAL_COMMUNITY): Payer: Medicare Other

## 2014-09-05 ENCOUNTER — Encounter (HOSPITAL_COMMUNITY): Payer: Self-pay | Admitting: Cardiovascular Disease

## 2014-09-05 DIAGNOSIS — I5021 Acute systolic (congestive) heart failure: Secondary | ICD-10-CM | POA: Diagnosis not present

## 2014-09-05 DIAGNOSIS — I214 Non-ST elevation (NSTEMI) myocardial infarction: Secondary | ICD-10-CM

## 2014-09-05 DIAGNOSIS — I429 Cardiomyopathy, unspecified: Secondary | ICD-10-CM

## 2014-09-05 DIAGNOSIS — I420 Dilated cardiomyopathy: Secondary | ICD-10-CM

## 2014-09-05 DIAGNOSIS — R0600 Dyspnea, unspecified: Secondary | ICD-10-CM

## 2014-09-05 LAB — BASIC METABOLIC PANEL
Anion gap: 8 (ref 5–15)
BUN: 15 mg/dL (ref 6–23)
CALCIUM: 9.2 mg/dL (ref 8.4–10.5)
CO2: 31 mmol/L (ref 19–32)
Chloride: 102 mmol/L (ref 96–112)
Creatinine, Ser: 1.28 mg/dL (ref 0.50–1.35)
GFR calc Af Amer: 64 mL/min — ABNORMAL LOW (ref >90)
GFR, EST NON AFRICAN AMERICAN: 55 mL/min — AB (ref >90)
Glucose, Bld: 91 mg/dL (ref 70–99)
Potassium: 3.9 mmol/L (ref 3.5–5.1)
SODIUM: 141 mmol/L (ref 135–145)

## 2014-09-05 LAB — CBC
HCT: 40.3 % (ref 39.0–52.0)
Hemoglobin: 13.1 g/dL (ref 13.0–17.0)
MCH: 28.7 pg (ref 26.0–34.0)
MCHC: 32.5 g/dL (ref 30.0–36.0)
MCV: 88.4 fL (ref 78.0–100.0)
Platelets: 184 10*3/uL (ref 150–400)
RBC: 4.56 MIL/uL (ref 4.22–5.81)
RDW: 13.8 % (ref 11.5–15.5)
WBC: 6.7 10*3/uL (ref 4.0–10.5)

## 2014-09-05 LAB — CK TOTAL AND CKMB (NOT AT ARMC)
CK, MB: 8.2 ng/mL (ref 0.3–4.0)
Relative Index: 4 — ABNORMAL HIGH (ref 0.0–2.5)
Total CK: 203 U/L (ref 7–232)

## 2014-09-05 LAB — HEMOGLOBIN A1C
Hgb A1c MFr Bld: 6 % — ABNORMAL HIGH (ref 4.8–5.6)
MEAN PLASMA GLUCOSE: 126 mg/dL

## 2014-09-05 LAB — HEPARIN LEVEL (UNFRACTIONATED)
Heparin Unfractionated: 0.34 IU/mL (ref 0.30–0.70)
Heparin Unfractionated: 0.3 IU/mL (ref 0.30–0.70)

## 2014-09-05 LAB — TROPONIN I: TROPONIN I: 6.13 ng/mL — AB (ref <0.031)

## 2014-09-05 MED ORDER — SPIRONOLACTONE 25 MG PO TABS
25.0000 mg | ORAL_TABLET | Freq: Every day | ORAL | Status: DC
  Administered 2014-09-06 – 2014-09-07 (×2): 25 mg via ORAL
  Filled 2014-09-05 (×2): qty 1

## 2014-09-05 MED ORDER — LISINOPRIL 20 MG PO TABS
20.0000 mg | ORAL_TABLET | Freq: Every day | ORAL | Status: DC
  Administered 2014-09-05 – 2014-09-07 (×3): 20 mg via ORAL
  Filled 2014-09-05 (×3): qty 1

## 2014-09-05 MED ORDER — GADOBENATE DIMEGLUMINE 529 MG/ML IV SOLN
25.0000 mL | Freq: Once | INTRAVENOUS | Status: AC
  Administered 2014-09-05: 25 mL via INTRAVENOUS

## 2014-09-05 MED ORDER — FUROSEMIDE 10 MG/ML IJ SOLN
40.0000 mg | Freq: Two times a day (BID) | INTRAMUSCULAR | Status: DC
  Administered 2014-09-05 (×2): 40 mg via INTRAVENOUS
  Filled 2014-09-05 (×3): qty 4

## 2014-09-05 MED ORDER — SPIRONOLACTONE 12.5 MG HALF TABLET
12.5000 mg | ORAL_TABLET | Freq: Every day | ORAL | Status: DC
  Administered 2014-09-05: 12.5 mg via ORAL
  Filled 2014-09-05: qty 1

## 2014-09-05 MED ORDER — CARVEDILOL 6.25 MG PO TABS
6.2500 mg | ORAL_TABLET | Freq: Two times a day (BID) | ORAL | Status: DC
  Administered 2014-09-05 (×2): 6.25 mg via ORAL
  Filled 2014-09-05 (×5): qty 1

## 2014-09-05 NOTE — Progress Notes (Signed)
  Echocardiogram 2D Echocardiogram has been performed.  Bryan Lamb FRANCES 09/05/2014, 3:01 PM

## 2014-09-05 NOTE — Progress Notes (Signed)
Patient's family would like to speak with the cardiologist in reference to the patient and his test and procedure results. You can call family and they will be happy to meet with you when you are available.   Marciano Sequin - patient's wife [phone] or Lattie Haw patient's daughter [phone].

## 2014-09-05 NOTE — Progress Notes (Signed)
This is preliminary Cardiac MRI report.  1. Mildly dilated LV with severe LV systolic impairment. There is diffuse hypokinesis, akinesis of mid inferior wall, all apical segments and of the true apex. There is also paradoxical septal motion. Estimated LVEF 15-20%. There is significant stasis of the blood in the apex suspicious of an early thrombus formation.  2. Normal size right ventricle with moderate right ventricular systolic impairment.   3. Mild aortic, mitral, and tricuspid regurgitation.  4. There is severe diffuse middwall late gadolinium enhancement seen in at least 10 segments and near transmural to transmural late gadolinium enhancement in the mid inferior, all apical segments and in the true apex.  There is mid wall late gadolinium enhancement in the entire right ventricular wall.  5. T2 images are showing diffuse LV edema more pronounced in the mid and distal inferior wall and in the apex.  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    Recommendations:  1. Aggressive HF therapy should be initiated. 2. Anticoagulation therapy is recommended.  Final read to follow.  Dorothy Spark 09/05/2014

## 2014-09-05 NOTE — Progress Notes (Signed)
ANTICOAGULATION CONSULT NOTE - Initial Consult  Pharmacy Consult for heparin Indication: chest pain/ACS  No Known Allergies  Patient Measurements: Height: 5\' 7"  (170.2 cm) Weight: 162 lb 11.2 oz (73.8 kg) IBW/kg (Calculated) : 66.1 Heparin Dosing Weight: 78 kg  Vital Signs:    Labs:  Recent Labs  09/03/14 2104 09/04/14 0004 09/04/14 0506 09/04/14 1115 09/04/14 1235 09/05/14 0418 09/05/14 1628  HGB 12.6*  --  12.2*  --   --  13.1  --   HCT 37.9*  --  37.6*  --   --  40.3  --   PLT 175  --  165  --   --  184  --   APTT 29  --   --   --   --   --   --   LABPROT 13.4  --  13.3  --   --   --   --   INR 1.01  --  1.00  --   --   --   --   HEPARINUNFRC  --   --  <0.10*  --  0.22*  --  0.34  CREATININE 1.00  --  0.92  --   --   --   --   TROPONINI  --  1.68* 3.95* 6.48*  --   --   --     Estimated Creatinine Clearance: 70.8 mL/min (by C-G formula based on Cr of 0.92).   Medical History: Past Medical History  Diagnosis Date  . Urinary incontinence     Medications:  See EMR  Assessment: Presents with L arm numbess, dizziness.  Pt on IV heparin>>cath today. Cath impression: Severe left ventricular dysfunction without significant CAD or valvular disease.The presence of cardiomegaly suggests a more chronic disorder, but the elevated cardiac enzymes suggest a more acute event. Consider acute myocarditis, Takotsubo syndrome, LV thrombembolism or even transient malignant ventricular arrhythmia. Diuretics administered for elevated LVEDP and ACE inhibitor started. Echo pending.  HL is therapeutic at 0.34 on heparin 1400 units/hr. No bleeding is noted. CBC is wnl.   Goal of Therapy:  Heparin level 0.3-0.7 units/ml Monitor platelets by anticoagulation protocol: Yes   Plan:  Continue Heparin 1400 units/hr 6h confirmatory HL Daily heparin level and CBC   Andrey Cota. Diona Foley, PharmD Clinical Pharmacist Pager [phone]  09/05/2014 5:16 PM

## 2014-09-05 NOTE — Progress Notes (Signed)
Reviewed MRI prelim report by Dr. Meda Coffee.  Severe LV dysfunction EF 15% with dilated LV and also moderate RV systolic dysfunction.  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  He remains tachycardic with stable BP.  Will titrated CHF meds as BP tolerates.  Will go ahead and increase aldactone to 25mg  daily.  I have discussed case with Dr. Aundra Dubin and he will see patient in the am.  There is ? Early formation of apical thrombus in LV so will start Xarelto in the am.  Continue IV Heparin over night.

## 2014-09-05 NOTE — Progress Notes (Addendum)
SUBJECTIVE:  No complaints  OBJECTIVE:   Vitals:   Filed Vitals:   09/04/14 2040 09/04/14 2144 09/05/14 0218 09/05/14 0511  BP: 146/86 128/83  140/85  Pulse: 92 102  108  Temp: 98.2 F (36.8 C)   97.8 F (36.6 C)  TempSrc: Oral   Oral  Resp: 18 20  18   Height:      Weight:   162 lb 11.2 oz (73.8 kg)   SpO2: 95% 90%  100%   I&O's:   Intake/Output Summary (Last 24 hours) at 09/05/14 0820 Last data filed at 09/05/14 0700  Gross per 24 hour  Intake 750.87 ml  Output    625 ml  Net 125.87 ml   TELEMETRY: Reviewed telemetry pt in NSR:     PHYSICAL EXAM General: Well developed, well nourished, in no acute distress Head: Eyes PERRLA, No xanthomas.   Normal cephalic and atramatic  Lungs:   Crackles at bases bilaterally. Heart:   HRRR S1 S2 Pulses are 2+ & equal. Abdomen: Bowel sounds are positive, abdomen soft and non-tender without masses Extremities:   No clubbing, cyanosis or edema.  DP +1 Neuro: Alert and oriented X 3. Psych:  Good affect, responds appropriately   LABS: Basic Metabolic Panel:  Recent Labs  09/03/14 2104 09/04/14 0004 09/04/14 0506  NA 139  --  139  K 3.7  --  4.1  CL 110  --  106  CO2 21  --  26  GLUCOSE 135*  --  133*  BUN 11  --  10  CREATININE 1.00  --  0.92  CALCIUM 8.6  --  8.2*  MG  --  1.8  --    Liver Function Tests:  Recent Labs  09/03/14 2104  AST 22  ALT 13  ALKPHOS 67  BILITOT 0.8  PROT 6.9  ALBUMIN 3.3*   No results for input(s): LIPASE, AMYLASE in the last 72 hours. CBC:  Recent Labs  09/03/14 2104 09/04/14 0506 09/05/14 0418  WBC 5.6 5.2 6.7  NEUTROABS 3.4  --   --   HGB 12.6* 12.2* 13.1  HCT 37.9* 37.6* 40.3  MCV 86.3 88.7 88.4  PLT 175 165 184   Cardiac Enzymes:  Recent Labs  09/04/14 0004 09/04/14 0506 09/04/14 1115  TROPONINI 1.68* 3.95* 6.48*   BNP: Invalid input(s): POCBNP D-Dimer: No results for input(s): DDIMER in the last 72 hours. Hemoglobin A1C:  Recent Labs  09/04/14 0004    HGBA1C 6.0*   Fasting Lipid Panel:  Recent Labs  09/04/14 0506  CHOL 161  HDL 37*  LDLCALC 112*  TRIG 62  CHOLHDL 4.4   Thyroid Function Tests:  Recent Labs  09/04/14 0004  TSH 0.060*   Anemia Panel: No results for input(s): VITAMINB12, FOLATE, FERRITIN, TIBC, IRON, RETICCTPCT in the last 72 hours. Coag Panel:   Lab Results  Component Value Date   INR 1.00 09/04/2014   INR 1.01 09/03/2014    RADIOLOGY: Ct Head (brain) Wo Contrast  09/03/2014   CLINICAL DATA:  Initial evaluation for acute left upper extremity numbness, dizziness.  EXAM: CT HEAD WITHOUT CONTRAST  TECHNIQUE: Contiguous axial images were obtained from the base of the skull through the vertex without intravenous contrast.  COMPARISON:  None.  FINDINGS: Mild generalized cerebral atrophy present. Patchy and confluent hypodensity within the periventricular and deep white matter both cerebral hemispheres most consistent with chronic small vessel ischemic disease. Encephalomalacia within the left frontal and left parietal lobes most consistent with  remote infarcts.  No acute large vessel territory infarct identified. No mass lesion or midline shift. No intracranial hemorrhage. No hydrocephalus or extra-axial fluid collection.  Scalp soft tissues within normal limits. No acute abnormality seen about the orbits.  Calvarium intact. Paranasal sinuses and mastoid air cells are clear.  IMPRESSION: 1. No acute intracranial process. 2. Remote left frontal and parietal lobe infarcts. 3. Atrophy with chronic microvascular ischemic disease.   Electronically Signed   By: Jeannine Boga M.D.   On: 09/03/2014 23:47   Dg Chest Port 1 View  09/03/2014   CLINICAL DATA:  Acute onset of left-sided arm numbness and dizziness. Initial encounter.  EXAM: PORTABLE CHEST - 1 VIEW  COMPARISON:  None.  FINDINGS: The lungs are well-aerated. Bibasilar atelectasis is noted. Mild vascular congestion is noted. There is no evidence of pleural effusion  or pneumothorax.  The cardiomediastinal silhouette is mildly enlarged. No acute osseous abnormalities are seen.  IMPRESSION: Mild vascular congestion and mild cardiomegaly. Bibasilar atelectasis noted.   Electronically Signed   By: Garald Balding M.D.   On: 09/03/2014 22:27    ASSESSMENT AND PLAN  6M smoker who presents with NSTEMI. He has multiple risk factors for CAD including extensive smoking history and an ECG with evidence of prior infarct and diffuse repolarization abnormalities.  1. NSTEMI - trop now up to 6.48. Cath showed normal coronary arteries.  Continue ASA/high dose statin/BB 2. Ongoing tobacco use -smoking cessation counseling 3. Dyslipidemia with LDL 112. Started on high dose statin.  4. HTN - will change Lopressor to Coreg due to LV dysfunction 5.  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  ? Etiology of CM. Agree with Dr. Sallyanne Kuster that presence of CM suggests a more chronic disorder but the significantly elevated cardiac enzymes suggest a more acute event possibly acute myocarditis vs. Takotsubo vs. LV thromboembolism.   I will check a protein electrophoresis, HIV, ferritin level to rule out secondary causes of DCM but most likely related to poorly controlled HTN.  I will get a cardiac MRI to assess for possible acute myocarditis.  Will change Lopressor to Coreg 6.25mg  BID.  Increase Lisinopril to 20mg  daily.  Start Aldactone 12.5mg  daily.  Change Lasix to 40mg  IV BID due to crackles on exam and markedly elevated LVEDP.  Follow BMET closely.  Will continue IV heparin until 2D echo complete to make sure he does not have an LV thrombus.     Sueanne Margarita, MD  09/05/2014  8:20 AM

## 2014-09-05 NOTE — Progress Notes (Signed)
Pt stated that phlebotomist told him when she withdrew blood from left AC she pulled back clots.  Pt on way to MR cardiac morphology.  Will inform attending day shift RN [place].

## 2014-09-05 NOTE — Progress Notes (Signed)
ANTICOAGULATION CONSULT NOTE - Follow-Up Consult  Pharmacy Consult for heparin Indication: chest pain/ACS  No Known Allergies  Patient Measurements: Height: 5\' 7"  (170.2 cm) Weight: 162 lb 11.2 oz (73.8 kg) IBW/kg (Calculated) : 66.1 Heparin Dosing Weight: 78 kg  Vital Signs: Temp: 98.3 F (36.8 C) (03/02 2101) Temp Source: Oral (03/02 2101) BP: 135/80 mmHg (03/02 2101) Pulse Rate: 106 (03/02 2101)  Labs:  Recent Labs  09/03/14 2104 09/04/14 0004  09/04/14 0506 09/04/14 1115 09/04/14 1235 09/05/14 0418 09/05/14 1628 09/05/14 2001 09/05/14 2153  HGB 12.6*  --   --  12.2*  --   --  13.1  --   --   --   HCT 37.9*  --   --  37.6*  --   --  40.3  --   --   --   PLT 175  --   --  165  --   --  184  --   --   --   APTT 29  --   --   --   --   --   --   --   --   --   LABPROT 13.4  --   --  13.3  --   --   --   --   --   --   INR 1.01  --   --  1.00  --   --   --   --   --   --   HEPARINUNFRC  --   --   < > <0.10*  --  0.22*  --  0.34  --  0.30  CREATININE 1.00  --   --  0.92  --   --   --   --  1.28  --   TROPONINI  --  1.68*  --  3.95* 6.48*  --   --   --   --   --   < > = values in this interval not displayed.  Estimated Creatinine Clearance: 50.9 mL/min (by C-G formula based on Cr of 1.28).   Medical History: Past Medical History  Diagnosis Date  . Urinary incontinence     Medications:  See EMR  Assessment: Presents with L arm numbess, dizziness.  Pt on IV heparin>>cath today. Cath impression: Severe left ventricular dysfunction without significant CAD or valvular disease.The presence of cardiomegaly suggests a more chronic disorder, but the elevated cardiac enzymes suggest a more acute event. Consider acute myocarditis, Takotsubo syndrome, LV thrombembolism or even transient malignant ventricular arrhythmia. Diuretics administered for elevated LVEDP and ACE inhibitor started. Echo pending.  HL is therapeutic at 0.34 on heparin 1400 units/hr. No bleeding is  noted. CBC is wnl.  Confirmatory HL remains therapeutic at 0.3 on heparin 1400 units/hr. No bleeding is noted. Will increase slightly to keep from becoming subtherapeutic.   Goal of Therapy:  Heparin level 0.3-0.7 units/ml Monitor platelets by anticoagulation protocol: Yes   Plan:  Increase Heparin to 1500 units/hr Daily heparin level and CBC  Monitor s/sx of bleeding  Andrey Cota. Diona Foley, PharmD Clinical Pharmacist Pager [phone]  09/05/2014 10:40 PM

## 2014-09-06 ENCOUNTER — Encounter: Payer: Self-pay | Admitting: Internal Medicine

## 2014-09-06 LAB — CBC
HCT: 40.4 % (ref 39.0–52.0)
Hemoglobin: 13.6 g/dL (ref 13.0–17.0)
MCH: 29.1 pg (ref 26.0–34.0)
MCHC: 33.7 g/dL (ref 30.0–36.0)
MCV: 86.3 fL (ref 78.0–100.0)
PLATELETS: 175 10*3/uL (ref 150–400)
RBC: 4.68 MIL/uL (ref 4.22–5.81)
RDW: 13.8 % (ref 11.5–15.5)
WBC: 5.7 10*3/uL (ref 4.0–10.5)

## 2014-09-06 LAB — HIV ANTIBODY (ROUTINE TESTING W REFLEX): HIV Screen 4th Generation wRfx: NONREACTIVE

## 2014-09-06 LAB — BASIC METABOLIC PANEL
ANION GAP: 12 (ref 5–15)
BUN: 15 mg/dL (ref 6–23)
CALCIUM: 8.8 mg/dL (ref 8.4–10.5)
CO2: 27 mmol/L (ref 19–32)
CREATININE: 1.19 mg/dL (ref 0.50–1.35)
Chloride: 102 mmol/L (ref 96–112)
GFR calc non Af Amer: 61 mL/min — ABNORMAL LOW (ref >90)
GFR, EST AFRICAN AMERICAN: 70 mL/min — AB (ref >90)
Glucose, Bld: 82 mg/dL (ref 70–99)
Potassium: 3.5 mmol/L (ref 3.5–5.1)
Sodium: 141 mmol/L (ref 135–145)

## 2014-09-06 LAB — TROPONIN I
Troponin I: 5.44 ng/mL (ref <0.031)
Troponin I: 4.22 ng/mL (ref <0.031)

## 2014-09-06 LAB — FERRITIN: Ferritin: 133 ng/mL (ref 22–322)

## 2014-09-06 LAB — HEPARIN LEVEL (UNFRACTIONATED): Heparin Unfractionated: 0.24 IU/mL — ABNORMAL LOW (ref 0.30–0.70)

## 2014-09-06 MED ORDER — WARFARIN SODIUM 7.5 MG PO TABS
7.5000 mg | ORAL_TABLET | Freq: Once | ORAL | Status: AC
  Administered 2014-09-06: 7.5 mg via ORAL
  Filled 2014-09-06: qty 1

## 2014-09-06 MED ORDER — ENOXAPARIN (LOVENOX) PATIENT EDUCATION KIT
PACK | Freq: Once | Status: AC
  Administered 2014-09-06: 10:00:00
  Filled 2014-09-06: qty 1

## 2014-09-06 MED ORDER — ENOXAPARIN SODIUM 80 MG/0.8ML ~~LOC~~ SOLN
75.0000 mg | Freq: Once | SUBCUTANEOUS | Status: AC
  Administered 2014-09-06: 75 mg via SUBCUTANEOUS
  Filled 2014-09-06: qty 0.8

## 2014-09-06 MED ORDER — WARFARIN VIDEO
Freq: Once | Status: AC
  Administered 2014-09-06: 10:00:00

## 2014-09-06 MED ORDER — COUMADIN BOOK
Freq: Once | Status: AC
  Administered 2014-09-06: 09:00:00
  Filled 2014-09-06: qty 1

## 2014-09-06 MED ORDER — FUROSEMIDE 40 MG PO TABS
40.0000 mg | ORAL_TABLET | Freq: Every day | ORAL | Status: DC
  Administered 2014-09-06 – 2014-09-07 (×2): 40 mg via ORAL
  Filled 2014-09-06 (×2): qty 1

## 2014-09-06 MED ORDER — ENOXAPARIN SODIUM 80 MG/0.8ML ~~LOC~~ SOLN
75.0000 mg | Freq: Two times a day (BID) | SUBCUTANEOUS | Status: DC
  Administered 2014-09-06 – 2014-09-07 (×2): 75 mg via SUBCUTANEOUS
  Filled 2014-09-06 (×4): qty 0.8

## 2014-09-06 MED ORDER — WARFARIN - PHARMACIST DOSING INPATIENT: Freq: Every day | Status: DC

## 2014-09-06 MED ORDER — CARVEDILOL 12.5 MG PO TABS
12.5000 mg | ORAL_TABLET | Freq: Two times a day (BID) | ORAL | Status: DC
  Administered 2014-09-06 – 2014-09-07 (×3): 12.5 mg via ORAL
  Filled 2014-09-06 (×5): qty 1

## 2014-09-06 NOTE — Progress Notes (Signed)
Pt labs included critical value, CK-MB is 8.2, Troponin is 6.13, MD notified and made aware.   Fulton Mole, [place] 09/05/2014

## 2014-09-06 NOTE — Care Management Note (Signed)
    Page 1 of 2   09/07/2014     12:40:54 PM CARE MANAGEMENT NOTE 09/07/2014  Patient:  Bryan Lamb, Bryan Lamb   Account Number:  [id number]  Date Initiated:  09/04/2014  Documentation initiated by:  Whitman Hero  Subjective/Objective Assessment:   PTA  from home with wife admiitted for NSTEMI     Action/Plan:   Return to home when medically stable.   Anticipated DC Date:  09/07/2014   Anticipated DC Plan:  HOME/SELF CARE      DC Planning Services  CM consult  Medication Assistance      Choice offered to / List presented to:     DME arranged  OTHER - SEE COMMENT           Status of service:  Completed, signed off Medicare Important Message given?  YES (If response is "NO", the following Medicare IM given date fields will be blank) Date Medicare IM given:  09/07/2014 Medicare IM given by:  Whitman Hero Date Additional Medicare IM given:   Additional Medicare IM given by:    Discharge Disposition:  HOME/SELF CARE  Per UR Regulation:  Reviewed for med. necessity/level of care/duration of stay  If discussed at [place] of Stay Meetings, dates discussed:    Comments:  09/07/14- [place] RN, BSN [phone] Referral received for Lovenox benefit check- spoke with pt and wife at bedside- per conversation pt states that he has [place] but does not have his card with him- uses [employer]- per PA- will need 5-7 days of Lovenox bridge to coumadin spoke with pt and called Pharmacy - pt has part D- under [place]- member ID- [id number]  PER [facility]  ESTIMATED CO-PAY FOR 30 DAY SUPPLY OF ENOXAPARIN SODIUM- $1600  spoke with Suanne Marker -PA with cards- regarding pt's non part D coverage- and high out of pocket cost- also spoke with pt and wife and discussed that pt did not have medication coverage- Lovenox cost for 5-7 days would be about $800 pt would need to pay out of pocket - other option would be to get closer to therapeutic range and need less days of bridge and could  MATCH pt on discharge- gave pt and wife time to discuss privately  and on f/u pt and wife have decided that they want to go home with Lovenox understand the cost and willing to pay out of pocket- Rhonda- PA with cards notified of pt's choice and will work on discharge.    09/06/14- [place] RN, BSN [phone] Received referral for LifeVest at discharge- call made to [facility] with [employer]- regarding LifeVest needs- Eulas Post to f/u with needed paperwork for LifeVest approval.

## 2014-09-06 NOTE — Progress Notes (Signed)
ANTICOAGULATION CONSULT NOTE - Initial Consult  Pharmacy Consult for Lovenox and Coumadin Indication: LV thrombus  No Known Allergies  Patient Measurements: Height: 5\' 7"  (170.2 cm) Weight: 162 lb 11.2 oz (73.8 kg) IBW/kg (Calculated) : 66.1  Vital Signs: Temp: 98.2 F (36.8 C) (03/03 0500) Temp Source: Oral (03/03 0500) BP: 139/77 mmHg (03/03 0500) Pulse Rate: 65 (03/03 0500)  Labs:  Recent Labs  09/03/14 2104  09/04/14 0506 09/04/14 1115  09/05/14 0418 09/05/14 1628 09/05/14 2001 09/05/14 2153 09/05/14 2200 09/06/14 0314  HGB 12.6*  --  12.2*  --   --  13.1  --   --   --   --  13.6  HCT 37.9*  --  37.6*  --   --  40.3  --   --   --   --  40.4  PLT 175  --  165  --   --  184  --   --   --   --  175  APTT 29  --   --   --   --   --   --   --   --   --   --   LABPROT 13.4  --  13.3  --   --   --   --   --   --   --   --   INR 1.01  --  1.00  --   --   --   --   --   --   --   --   HEPARINUNFRC  --   --  <0.10*  --   < >  --  0.34  --  0.30  --  0.24*  CREATININE 1.00  --  0.92  --   --   --   --  1.28  --   --  1.19  CKTOTAL  --   --   --   --   --   --   --   --   --  203  --   CKMB  --   --   --   --   --   --   --   --   --  8.2*  --   TROPONINI  --   < > 3.95* 6.48*  --   --   --   --   --  6.13* 5.44*  < > = values in this interval not displayed.  Estimated Creatinine Clearance: 54.8 mL/min (by C-G formula based on Cr of 1.19).   Medical History: Past Medical History  Diagnosis Date  . Urinary incontinence     Medications:  Prescriptions prior to admission  Medication Sig Dispense Refill Last Dose  . finasteride (PROSCAR) 5 MG tablet Take 5 mg by mouth daily.  0 09/02/2014   Scheduled:  . aspirin EC  81 mg Oral Daily  . atorvastatin  80 mg Oral q1800  . carvedilol  12.5 mg Oral BID WC  . coumadin book   Does not apply Once  . enoxaparin (LOVENOX) injection  75 mg Subcutaneous Once  . enoxaparin (LOVENOX) injection  75 mg Subcutaneous Q12H  .  finasteride  5 mg Oral Daily  . furosemide  40 mg Oral Daily  . Influenza vac split quadrivalent PF  0.5 mL Intramuscular Tomorrow-1000  . lisinopril  20 mg Oral Daily  . nicotine  21 mg Transdermal Daily  . sodium chloride  3 mL Intravenous Q12H  . spironolactone  25 mg Oral Daily  . warfarin  7.5 mg Oral ONCE-1800  . warfarin   Does not apply Once  . Warfarin - Pharmacist Dosing Inpatient   Does not apply q1800    Assessment: 69yo male to transition from UFH to LMWH bridge and Coumadin for LV thrombus.  Goal of Therapy:  INR 2-3 Anti-Xa level 0.6-1 units/ml 4hrs after LMWH dose given Monitor platelets by anticoagulation protocol: Yes   Plan:  Will stop heparin then start Lovenox 75mg  SQ Q12H and monitor CBC and levels prn.  Will give Coumadin 7.5mg  po x1 today and monitor INR for dose adjustments; will begin Coumadin education.  Wynona Neat, PharmD, BCPS  09/06/2014,7:25 AM

## 2014-09-06 NOTE — Discharge Instructions (Addendum)
**NO LIFTING OVER 15 LBS FOR 6 MONTHS.**   Warfarin: What You Need to Know Warfarin is an anticoagulant. Anticoagulants help prevent the formation of blood clots. They also help stop the growth of blood clots. Warfarin is sometimes referred to as a "blood thinner."  Normally, when body tissues are cut or damaged, the blood clots in order to prevent blood loss. Sometimes clots form inside your blood vessels and obstruct the flow of blood through your circulatory system (thrombosis). These clots may travel through your bloodstream and become lodged in smaller blood vessels in your brain, which can cause a stroke, or in your lungs (pulmonary embolism). WHO SHOULD USE WARFARIN? Warfarin is prescribed for people at risk of developing harmful blood clots:  People with surgically implanted mechanical heart valves, irregular heart rhythms called atrial fibrillation, and certain clotting disorders.  People who have developed harmful blood clotting in the past, including those who have had a stroke or a pulmonary embolism, or thrombosis in their legs (deep vein thrombosis [DVT]).  People with an existing blood clot, such as a pulmonary embolism. WARFARIN DOSING Warfarin tablets come in different strengths. Each tablet strength is a different color, with the amount of warfarin (in milligrams) clearly printed on the tablet. If the color of your tablet is different than usual when you receive a new prescription, report it immediately to your pharmacist or health care provider. WARFARIN MONITORING The goal of warfarin therapy is to lessen the clotting tendency of blood but not prevent clotting completely. Your health care provider will monitor the anticoagulation effect of warfarin closely and adjust your dose as needed. For your safety, blood tests called prothrombin time (PT) or international normalized ratio (INR) are used to measure the effects of warfarin. Both of these tests can be done with a finger  stick or a blood draw. The longer it takes the blood to clot, the higher the PT or INR. Your health care provider will inform you of your "target" PT or INR range. If, at any time, your PT or INR is above the target range, there is a risk of bleeding. If your PT or INR is below the target range, there is a risk of clotting. Whether you are started on warfarin while you are in the hospital or in your health care provider's office, you will need to have your PT or INR checked within one week of starting the medicine. Initially, some people are asked to have their PT or INR checked as much as twice a week. Once you are on a stable maintenance dose, the PT or INR is checked less often, usually once every 2 to 4 weeks. The warfarin dose may be adjusted if the PT or INR is not within the target range. It is important to keep all laboratory and health care provider follow-up appointments. Not keeping appointments could result in a chronic or permanent injury, pain, or disability because warfarin is a medicine that requires close monitoring. WHAT ARE THE SIDE EFFECTS OF WARFARIN?  Too much warfarin can cause bleeding (hemorrhage) from any part of the body. This may include bleeding from the gums, blood in the urine, bloody or dark stools, a nosebleed that is not easily stopped, coughing up blood, or vomiting blood.  Too little warfarin can increase the risk of blood clots.  Too little or too much warfarin can also increase the risk of a stroke.  Warfarin use may cause a skin rash or irritation, an unusual fever, continual nausea or  stomach upset, or severe pain in your joints or back. SPECIAL PRECAUTIONS WHILE TAKING WARFARIN Warfarin should be taken exactly as directed. It is very important to take warfarin as directed since bleeding or blood clots could result in chronic or permanent injury, pain, or disability.  Take your medicine at the same time every day. If you forget to take your dose, you can take  it if it is within 6 hours of when it was due.  Do not change the dose of warfarin on your own to make up for missed or extra doses.  If you miss more than 2 doses in a row, you should contact your health care provider for advice. Avoid situations that cause bleeding. You may have a tendency to bleed more easily than usual while taking warfarin. The following actions can limit bleeding:  Using a softer toothbrush.  Flossing with waxed floss rather than unwaxed floss.  Shaving with an Copy rather than a blade.  Limiting the use of sharp objects.  Avoiding potentially harmful activities, such as contact sports. Warfarin and Pregnancy or Breastfeeding  Warfarin is not advised during the first trimester of pregnancy due to an increased risk of birth defects. In certain situations, a woman may take warfarin after her first trimester of pregnancy. A woman who becomes pregnant or plans to become pregnant while taking warfarin should notify her health care provider immediately.  Although warfarin does not pass into breast milk, a woman who wishes to breastfeed while taking warfarin should also consult with her health care provider. Alcohol, Smoking, and Illicit Drug Use  Alcohol affects how warfarin works in the body. It is best to avoid alcoholic drinks or consume very small amounts while taking warfarin. In general, alcohol intake should be limited to 1 oz (30 mL) of liquor, 6 oz (180 mL) of wine, or 12 oz (360 mL) of beer each day. Notify your health care provider if you change your alcohol intake.  Smoking affects how warfarin works. It is best to avoid smoking while taking warfarin. Notify your health care provider if you change your smoking habits.  It is best to avoid all illicit drugs while taking warfarin since there are few studies that show how warfarin interacts with these drugs. Other Medicines and Dietary Supplements Many prescription and over-the-counter medicines can  interfere with warfarin. Be sure all of your health care providers know you are taking warfarin. Notify your health care provider who prescribed warfarin for you or your pharmacist before starting or stopping any new medicines, including over-the-counter vitamins, dietary supplements, and pain medicines. Your warfarin dose may need to be adjusted. Some common over-the-counter medicines that may increase the risk of bleeding while taking warfarin include:   Acetaminophen.  Aspirin.  Nonsteroidal anti-inflammatory medicines (NSAIDs), such as ibuprofen or naproxen.  Vitamin E. Dietary Considerations  Foods that have moderate or high amounts of vitamin K can interfere with warfarin. Avoid major changes in your diet or notify your health care provider before changing your diet. Eat a consistent amount of foods that have moderate or high amounts of vitamin K. Eating less foods containing vitamin K can increase the risk of bleeding. Eating more foods containing vitamin K can increase the risk of blood clots. Additional questions about dietary considerations can be discussed with a dietitian. Foods that are very high in vitamin K:  Greens, such as Swiss chard and beet, collard, mustard, or turnip greens (fresh or frozen, cooked).  Kale (fresh or frozen, cooked).  Parsley (raw).  Spinach (cooked). Foods that are high in vitamin K:  Asparagus (frozen, cooked).  Beans, green (frozen, cooked).  Broccoli.  Bok choy (cooked).  Brussels sprouts (fresh or frozen, cooked).  Cabbage (cooked).   Coleslaw. Foods that are moderately high in vitamin K:  Blueberries.  Black-eyed peas.  Endive (raw).  Green leaf lettuce (raw).  Green scallions (raw).  Kale (raw).  Okra (frozen, cooked).  Plantains (fried).  Romaine lettuce (raw).  Sauerkraut (canned).  Spinach (raw). CALL YOUR CLINIC OR HEALTH CARE PROVIDER IF YOU:  Plan to have any surgery or procedure.  Feel sick, especially  if you have diarrhea or vomiting.  Experience or anticipate any major changes in your diet.  Start or stop a prescription or over-the-counter medicine.  Become, plan to become, or think you may be pregnant.  Are having heavier than usual menstrual periods.  Have had a fall, accident, or any symptoms of bleeding or unusual bruising.  Develop an unusual fever. CALL 911 IN THE [place] OR GO TO THE EMERGENCY DEPARTMENT IF YOU:   Think you may be having an allergic reaction to warfarin. The signs of an allergic reaction could include itching, rash, hives, swelling, chest tightness, or trouble breathing.  See signs of blood in your urine. The signs could include reddish, pinkish, or tea-colored urine.  See signs of blood in your stools. The signs could include bright red or black stools.  Vomit or cough up blood. In these instances, the blood could have either a bright red or a "coffee-grounds" appearance.  Have bleeding that will not stop after applying pressure for 30 minutes such as cuts, nosebleeds, or other injuries.  Have severe pain in your joints or back.  Have a new and severe headache.  Have sudden weakness or numbness of your face, arm, or leg, especially on one side of your body.  Have sudden confusion or trouble understanding.  Have sudden trouble seeing in one or both eyes.  Have sudden trouble walking, dizziness, loss of balance, or coordination.  Have trouble speaking or understanding (aphasia). Document Released: 06/22/2005 Document Revised: 11/06/2013 Document Reviewed: 12/16/2012 [facility] Patient Information 2015 [place], [place]. This information is not intended to replace advice given to you by your health care provider. Make sure you discuss any questions you have with your health care provider.

## 2014-09-06 NOTE — Progress Notes (Signed)
ANTICOAGULATION CONSULT NOTE - Follow Up Consult  Pharmacy Consult for heparin Indication: NSTEMI and early apical thrombus  Labs:  Recent Labs  09/03/14 2104  09/04/14 0506 09/04/14 1115  09/05/14 0418 09/05/14 1628 09/05/14 2001 09/05/14 2153 09/05/14 2200 09/06/14 0314  HGB 12.6*  --  12.2*  --   --  13.1  --   --   --   --  13.6  HCT 37.9*  --  37.6*  --   --  40.3  --   --   --   --  40.4  PLT 175  --  165  --   --  184  --   --   --   --  175  APTT 29  --   --   --   --   --   --   --   --   --   --   LABPROT 13.4  --  13.3  --   --   --   --   --   --   --   --   INR 1.01  --  1.00  --   --   --   --   --   --   --   --   HEPARINUNFRC  --   --  <0.10*  --   < >  --  0.34  --  0.30  --  0.24*  CREATININE 1.00  --  0.92  --   --   --   --  1.28  --   --   --   CKTOTAL  --   --   --   --   --   --   --   --   --  203  --   CKMB  --   --   --   --   --   --   --   --   --  8.2*  --   TROPONINI  --   < > 3.95* 6.48*  --   --   --   --   --  6.13*  --   < > = values in this interval not displayed.   Assessment: 69yo male now subtherapeutic on heparin despite rate increase last pm; plan to start Xarelto today.  Goal of Therapy:  Heparin level 0.3-0.7 units/ml   Plan:  Will increase heparin gtt by 2-3 units/kg/hr to 1700 units/hr and check level in 8hr vs transition to Xarelto.  Wynona Neat, PharmD, BCPS  09/06/2014,4:59 AM

## 2014-09-06 NOTE — Progress Notes (Signed)
Patient ID: Bryan Lamb, male   DOB: December 11, 1945, 69 y.o.   MRN: [id number]   SUBJECTIVE: Patient feels well this morning, no dyspnea or chest pain.  Telemetry shows PVCs and a 3 beat run NSVT.   LHC (3/1): No angiographic CAD. Cardiac MRI (3/2): EF 15-20% diffuse hypokinesis, moderate RV systolic dysfunction, concern for early thrombus LV apex, mid inferior and peri-apical transmural LGE with mid-wall RV LGE => concern for acute myocarditis.    Filed Vitals:   09/05/14 0218 09/05/14 0511 09/05/14 2101 09/06/14 0500  BP:  140/85 135/80 139/77  Pulse:  108 106 65  Temp:  97.8 F (36.6 C) 98.3 F (36.8 C) 98.2 F (36.8 C)  TempSrc:  Oral Oral Oral  Resp:  18 18 18   Height:      Weight: 162 lb 11.2 oz (73.8 kg)     SpO2:  100% 98% 98%   No intake or output data in the 24 hours ending 09/06/14 0718  LABS: Basic Metabolic Panel:  Recent Labs  09/04/14 0004  09/05/14 2001 09/06/14 0314  NA  --   < > 141 141  K  --   < > 3.9 3.5  CL  --   < > 102 102  CO2  --   < > 31 27  GLUCOSE  --   < > 91 82  BUN  --   < > 15 15  CREATININE  --   < > 1.28 1.19  CALCIUM  --   < > 9.2 8.8  MG 1.8  --   --   --   < > = values in this interval not displayed. Liver Function Tests:  Recent Labs  09/03/14 2104  AST 22  ALT 13  ALKPHOS 67  BILITOT 0.8  PROT 6.9  ALBUMIN 3.3*   No results for input(s): LIPASE, AMYLASE in the last 72 hours. CBC:  Recent Labs  09/03/14 2104  09/05/14 0418 09/06/14 0314  WBC 5.6  < > 6.7 5.7  NEUTROABS 3.4  --   --   --   HGB 12.6*  < > 13.1 13.6  HCT 37.9*  < > 40.3 40.4  MCV 86.3  < > 88.4 86.3  PLT 175  < > 184 175  < > = values in this interval not displayed. Cardiac Enzymes:  Recent Labs  09/04/14 1115 09/05/14 2200 09/06/14 0314  CKTOTAL  --  203  --   CKMB  --  8.2*  --   TROPONINI 6.48* 6.13* 5.44*   BNP: Invalid input(s): POCBNP D-Dimer: No results for input(s): DDIMER in the last 72 hours. Hemoglobin A1C:  Recent  Labs  09/04/14 0004  HGBA1C 6.0*   Fasting Lipid Panel:  Recent Labs  09/04/14 0506  CHOL 161  HDL 37*  LDLCALC 112*  TRIG 62  CHOLHDL 4.4   Thyroid Function Tests:  Recent Labs  09/04/14 0004  TSH 0.060*   Anemia Panel: No results for input(s): VITAMINB12, FOLATE, FERRITIN, TIBC, IRON, RETICCTPCT in the last 72 hours.  RADIOLOGY: Ct Head (brain) Wo Contrast  09/03/2014   CLINICAL DATA:  Initial evaluation for acute left upper extremity numbness, dizziness.  EXAM: CT HEAD WITHOUT CONTRAST  TECHNIQUE: Contiguous axial images were obtained from the base of the skull through the vertex without intravenous contrast.  COMPARISON:  None.  FINDINGS: Mild generalized cerebral atrophy present. Patchy and confluent hypodensity within the periventricular and deep white matter both cerebral hemispheres most consistent with chronic  small vessel ischemic disease. Encephalomalacia within the left frontal and left parietal lobes most consistent with remote infarcts.  No acute large vessel territory infarct identified. No mass lesion or midline shift. No intracranial hemorrhage. No hydrocephalus or extra-axial fluid collection.  Scalp soft tissues within normal limits. No acute abnormality seen about the orbits.  Calvarium intact. Paranasal sinuses and mastoid air cells are clear.  IMPRESSION: 1. No acute intracranial process. 2. Remote left frontal and parietal lobe infarcts. 3. Atrophy with chronic microvascular ischemic disease.   Electronically Signed   By: Jeannine Boga M.D.   On: 09/03/2014 23:47   Dg Chest Port 1 View  09/03/2014   CLINICAL DATA:  Acute onset of left-sided arm numbness and dizziness. Initial encounter.  EXAM: PORTABLE CHEST - 1 VIEW  COMPARISON:  None.  FINDINGS: The lungs are well-aerated. Bibasilar atelectasis is noted. Mild vascular congestion is noted. There is no evidence of pleural effusion or pneumothorax.  The cardiomediastinal silhouette is mildly enlarged. No  acute osseous abnormalities are seen.  IMPRESSION: Mild vascular congestion and mild cardiomegaly. Bibasilar atelectasis noted.   Electronically Signed   By: Garald Balding M.D.   On: 09/03/2014 22:27    PHYSICAL EXAM General: NAD Neck: No JVD, no thyromegaly or thyroid nodule.  Lungs: Clear to auscultation bilaterally with normal respiratory effort. CV: Nondisplaced PMI.  Heart regular S1/S2, no S3/S4, no murmur.  No peripheral edema.  No carotid bruit.  Normal pedal pulses.  Abdomen: Soft, nontender, no hepatosplenomegaly, no distention.  Neurologic: Alert and oriented x 3.  Psych: Normal affect. Extremities: No clubbing or cyanosis.   TELEMETRY: Reviewed telemetry pt in NSR with PVCs, 1 run 3 beats NSVT  ASSESSMENT AND PLAN: 69 yo smoker with minimal additional past medical history presented with about a day of dyspnea and chest pain.  He had ECG changes (TWIs) and troponin increased to 6.13.  Echo showed EF 20%, diffuse hypokinesis.   1. Acute systolic CHF:  Nonischemic cardiomyopathy.  Left heart cath was done, showing no angiographic CAD.  Cardiac MRI (see description above) was concerning for acute myocarditis with T2 enhancement and multiple areas of mid-wall and transmural LGE. There was also concern for blood stasis/early thrombus in apex.  Patient actually is doing well this morning, denies dyspnea and now looks euvolemic on exam. The patient clinically looks better than cardiac MRI, suspect this is not fulminant or giant cell myocarditis.  - Continue lisinopril and spironolactone.  - Plenty BP room, will increase Coreg to 12.5 mg bid.  - Can transition to po Lasix today.  - Out of bed, ambulate.  Given concern for myocarditis, would avoid heavy exertion for at least 6 months.  2. Ventricular ectopy: PVCs, short run (3 beats) NSVT last night.  With acute myocarditis, increased risk of ventricular arrhythmias.  I would recommend that he wear a Lifevest going home.  I will arrange.   3. Apical blood stasis/possible early apical thrombus: I think he will need anticoagulation for the time being.  For LV thrombus, warfarin remains the only studied treatment.  Would arrange for Lovenox bridge to therapeutic coumadin. He could go home on this.   4. Disposition: Plan for discharge in am if he continues to do well, will need close followup.   Loralie Champagne 09/06/2014 7:30 AM

## 2014-09-07 DIAGNOSIS — I409 Acute myocarditis, unspecified: Secondary | ICD-10-CM | POA: Diagnosis present

## 2014-09-07 LAB — CBC
HCT: 41.4 % (ref 39.0–52.0)
Hemoglobin: 13.7 g/dL (ref 13.0–17.0)
MCH: 29.1 pg (ref 26.0–34.0)
MCHC: 33.1 g/dL (ref 30.0–36.0)
MCV: 88.1 fL (ref 78.0–100.0)
Platelets: 182 10*3/uL (ref 150–400)
RBC: 4.7 MIL/uL (ref 4.22–5.81)
RDW: 13.5 % (ref 11.5–15.5)
WBC: 5.1 10*3/uL (ref 4.0–10.5)

## 2014-09-07 LAB — PROTEIN ELECTROPHORESIS, SERUM
ALPHA-1-GLOBULIN: 6.5 % — AB (ref 2.9–4.9)
Albumin ELP: 47.9 % — ABNORMAL LOW (ref 55.8–66.1)
Alpha-2-Globulin: 14.6 % — ABNORMAL HIGH (ref 7.1–11.8)
Beta 2: 6.3 % (ref 3.2–6.5)
Beta Globulin: 5.3 % (ref 4.7–7.2)
Gamma Globulin: 19.4 % — ABNORMAL HIGH (ref 11.1–18.8)
M-Spike, %: 0.6 g/dL
Total Protein ELP: 6.8 g/dL (ref 6.0–8.3)

## 2014-09-07 LAB — BASIC METABOLIC PANEL
Anion gap: 11 (ref 5–15)
BUN: 16 mg/dL (ref 6–23)
CHLORIDE: 101 mmol/L (ref 96–112)
CO2: 27 mmol/L (ref 19–32)
Calcium: 8.9 mg/dL (ref 8.4–10.5)
Creatinine, Ser: 1.12 mg/dL (ref 0.50–1.35)
GFR calc Af Amer: 76 mL/min — ABNORMAL LOW (ref >90)
GFR calc non Af Amer: 65 mL/min — ABNORMAL LOW (ref >90)
GLUCOSE: 80 mg/dL (ref 70–99)
POTASSIUM: 3.8 mmol/L (ref 3.5–5.1)
Sodium: 139 mmol/L (ref 135–145)

## 2014-09-07 LAB — PROTIME-INR
INR: 1.03 (ref 0.00–1.49)
Prothrombin Time: 13.6 seconds (ref 11.6–15.2)

## 2014-09-07 MED ORDER — ENOXAPARIN SODIUM 80 MG/0.8ML ~~LOC~~ SOLN: 75.0000 mg | Freq: Two times a day (BID) | SUBCUTANEOUS | Status: DC

## 2014-09-07 MED ORDER — WARFARIN SODIUM 7.5 MG PO TABS: 7.5000 mg | ORAL_TABLET | Freq: Once | ORAL | Status: DC

## 2014-09-07 MED ORDER — LISINOPRIL 20 MG PO TABS: 20.0000 mg | ORAL_TABLET | Freq: Every day | ORAL | Status: DC

## 2014-09-07 MED ORDER — FUROSEMIDE 40 MG PO TABS: 40.0000 mg | ORAL_TABLET | Freq: Every day | ORAL | Status: DC

## 2014-09-07 MED ORDER — CARVEDILOL 12.5 MG PO TABS: 12.5000 mg | ORAL_TABLET | Freq: Two times a day (BID) | ORAL | Status: DC

## 2014-09-07 MED ORDER — SPIRONOLACTONE 25 MG PO TABS: 25.0000 mg | ORAL_TABLET | Freq: Every day | ORAL | Status: DC

## 2014-09-07 MED ORDER — ATORVASTATIN CALCIUM 80 MG PO TABS: 80.0000 mg | ORAL_TABLET | Freq: Every day | ORAL | Status: DC

## 2014-09-07 MED ORDER — NICOTINE 21 MG/24HR TD PT24: 21.0000 mg | MEDICATED_PATCH | Freq: Every day | TRANSDERMAL | Status: DC

## 2014-09-07 MED ORDER — WARFARIN SODIUM 7.5 MG PO TABS
7.5000 mg | ORAL_TABLET | Freq: Once | ORAL | Status: DC
  Filled 2014-09-07: qty 1

## 2014-09-07 MED ORDER — NITROGLYCERIN 0.4 MG SL SUBL: 0.4000 mg | SUBLINGUAL_TABLET | SUBLINGUAL | Status: DC | PRN

## 2014-09-07 NOTE — Progress Notes (Signed)
ANTICOAGULATION CONSULT NOTE - Initial Consult  Pharmacy Consult for Lovenox and Coumadin Indication: LV thrombus  No Known Allergies  Patient Measurements: Height: 5\' 7"  (170.2 cm) Weight: 162 lb 11.2 oz (73.8 kg) IBW/kg (Calculated) : 66.1  Vital Signs: Temp: 97.7 F (36.5 C) (03/04 0528) Temp Source: Oral (03/04 0528) BP: 118/87 mmHg (03/04 0756) Pulse Rate: 93 (03/04 0756)  Labs:  Recent Labs  09/05/14 0418 09/05/14 1628 09/05/14 2001 09/05/14 2153 09/05/14 2200 09/06/14 0314 09/06/14 0830 09/07/14 0413  HGB 13.1  --   --   --   --  13.6  --  13.7  HCT 40.3  --   --   --   --  40.4  --  41.4  PLT 184  --   --   --   --  175  --  182  LABPROT  --   --   --   --   --   --   --  13.6  INR  --   --   --   --   --   --   --  1.03  HEPARINUNFRC  --  0.34  --  0.30  --  0.24*  --   --   CREATININE  --   --  1.28  --   --  1.19  --  1.12  CKTOTAL  --   --   --   --  203  --   --   --   CKMB  --   --   --   --  8.2*  --   --   --   TROPONINI  --   --   --   --  6.13* 5.44* 4.22*  --     Estimated Creatinine Clearance: 58.2 mL/min (by C-G formula based on Cr of 1.12).   Medical History: Past Medical History  Diagnosis Date  . Urinary incontinence     Medications:  Prescriptions prior to admission  Medication Sig Dispense Refill Last Dose  . finasteride (PROSCAR) 5 MG tablet Take 5 mg by mouth daily.  0 09/02/2014   Scheduled:  . atorvastatin  80 mg Oral q1800  . carvedilol  12.5 mg Oral BID WC  . enoxaparin (LOVENOX) injection  75 mg Subcutaneous Q12H  . finasteride  5 mg Oral Daily  . furosemide  40 mg Oral Daily  . Influenza vac split quadrivalent PF  0.5 mL Intramuscular Tomorrow-1000  . lisinopril  20 mg Oral Daily  . nicotine  21 mg Transdermal Daily  . sodium chloride  3 mL Intravenous Q12H  . spironolactone  25 mg Oral Daily  . Warfarin - Pharmacist Dosing Inpatient   Does not apply q1800    Assessment: 69yo male LMWH bridge to Coumadin for LV  thrombus. INR 1/03, remains low after one dose of coumadin 7.5mg  yesterday. Scr 1.12, stable. hgb 13.7, plt 182. No bleeding noted per chart. Plan for discharge home on lovenox/coumadin.   Goal of Therapy:  INR 2-3 Anti-Xa level 0.6-1 units/ml 4hrs after LMWH dose given Monitor platelets by anticoagulation protocol: Yes   Plan:  Coumadin 7.5 mg po x1  Continue lovenox 75 mg sq q 12 hrs  If goes home, recommend coumadin 7.5 mg daily through this weekend, and f/u INR on Monday.  Maryanna Shape, PharmD, BCPS  Clinical Pharmacist  Pager: [phone]  09/07/2014,10:58 AM

## 2014-09-07 NOTE — Progress Notes (Signed)
Medicare Important Message given? YES (If response is "NO", the following Medicare IM given date fields will be blank) Date Medicare IM given:09/07/2014 Medicare IM given by: Meta Kroenke 

## 2014-09-07 NOTE — Discharge Summary (Signed)
CARDIOLOGY DISCHARGE SUMMARY   Patient ID: Bryan Lamb MRN: [id number] DOB/AGE: [birth year] 69 y.o.  Admit date: 09/03/2014 Discharge date: 09/07/2014  PCP: No PCP Per Patient Primary Cardiologist: Dr. Aundra Dubin, [facility]  Primary Discharge Diagnosis:  Acute myocarditis - normal coronaries at cath, EF 20%  Secondary Discharge Diagnosis:    NSTEMI (non-ST elevated myocardial infarction)   Tobacco abuse   Dyslipidemia   Nonischemic dilated cardiomyopathy   Acute systolic CHF (congestive heart failure)   Elevated troponin - not a true NSTEMI, felt 2nd to myocarditis  Consults: CHF  Procedures: cardiac catheterization, coronary arteriogram, left ventriculogram, 2-D echocardiogram, cardiac MRI with and without contrast, CT of the head without contrast, 2-D echocardiogram   Hospital Course: Bryan Lamb is a 69 y.o. male with no history of CAD. He had sudden onset of dizziness with left arm numbness and shortness of breath. He came to the emergency room. Problems with nausea and vomiting. His initial troponin was negative and his ECG was abnormal. He was given aspirin and heparin and admitted for further evaluation and treatment.  He had no history of CAD or ischemic symptoms. His cardiac enzymes elevated indicating a non-STEMI. He was started on a beta blocker, most a statin and an ACE inhibitor. He was taken to the cath lab on 09/04/2014, results are below.  He had no angiographic CAD but he had significant left ventricular dysfunction and elevated LVEDP requiring diuresis. A 2-D echocardiogram was performed, results below. It confirmed the dilated cardiomyopathy and there was concern for an apical thrombus. The appearance on cath and echocardiogram raised concerns for myocarditis, so he had a cardiac MRI.  Results are below. He had multiple areas of transmural enhancement, consistent with acute myocarditis. His appearance was not consistent with fulminant or giant cell  myocarditis, and his volume status improved with diuresis.   Because of his significant left ventricular dysfunction and short runs of nonsustained VT seen on telemetry, a LifeVest was ordered. Because of the possible apical thrombus, he was started on anticoagulation with Lovenox, transitioning to Coumadin.  On 09/06/2014 he was seen by Dr. Aundra Dubin medication adjustments were made. He will follow-up with Dr. Aundra Dubin as an outpatient. An appointment has been arranged.  On 09/07/2014, he was seen by Dr. Radford Pax and all data were reviewed. His volume status is stable. He is tolerating his medications well. He is willing to get the Lovenox out-of-pocket to prevent prolonged hospitalization, and will load coumadin as an outpatient. No further inpatient workup is indicated and he is considered stable for discharge, to follow up as an outpatient.   Labs:   Lab Results  Component Value Date   WBC 5.1 09/07/2014   HGB 13.7 09/07/2014   HCT 41.4 09/07/2014   MCV 88.1 09/07/2014   PLT 182 09/07/2014    Recent Labs Lab 09/03/14 2104  09/07/14 0413  NA 139  < > 139  K 3.7  < > 3.8  CL 110  < > 101  CO2 21  < > 27  BUN 11  < > 16  CREATININE 1.00  < > 1.12  CALCIUM 8.6  < > 8.9  PROT 6.9  --   --   BILITOT 0.8  --   --   ALKPHOS 67  --   --   ALT 13  --   --   AST 22  --   --   GLUCOSE 135*  < > 80  < > = values in  this interval not displayed.  Recent Labs  09/05/14 2200 09/06/14 0314 09/06/14 0830  CKTOTAL 203  --   --   CKMB 8.2*  --   --   TROPONINI 6.13* 5.44* 4.22*   Lipid Panel     Component Value Date/Time   CHOL 161 09/04/2014 0506   TRIG 62 09/04/2014 0506   HDL 37* 09/04/2014 0506   CHOLHDL 4.4 09/04/2014 0506   VLDL 12 09/04/2014 0506   LDLCALC 112* 09/04/2014 0506   Lab Results  Component Value Date   INR 1.03 09/07/2014   INR 1.00 09/04/2014   INR 1.01 09/03/2014    B NATRIURETIC PEPTIDE  Date/Time Value Ref Range Status  09/04/2014 12:04 AM 770.9* 0.0 -  100.0 pg/mL Final    Recent Labs  09/07/14 0413  INR 1.03      Radiology: Ct Head (brain) Wo Contrast 09/03/2014   CLINICAL DATA:  Initial evaluation for acute left upper extremity numbness, dizziness.  EXAM: CT HEAD WITHOUT CONTRAST  TECHNIQUE: Contiguous axial images were obtained from the base of the skull through the vertex without intravenous contrast.  COMPARISON:  None.  FINDINGS: Mild generalized cerebral atrophy present. Patchy and confluent hypodensity within the periventricular and deep white matter both cerebral hemispheres most consistent with chronic small vessel ischemic disease. Encephalomalacia within the left frontal and left parietal lobes most consistent with remote infarcts.  No acute large vessel territory infarct identified. No mass lesion or midline shift. No intracranial hemorrhage. No hydrocephalus or extra-axial fluid collection.  Scalp soft tissues within normal limits. No acute abnormality seen about the orbits.  Calvarium intact. Paranasal sinuses and mastoid air cells are clear.  IMPRESSION: 1. No acute intracranial process. 2. Remote left frontal and parietal lobe infarcts. 3. Atrophy with chronic microvascular ischemic disease.   Electronically Signed   By: Jeannine Boga M.D.   On: 09/03/2014 23:47   Dg Chest Port 1 View 09/03/2014   CLINICAL DATA:  Acute onset of left-sided arm numbness and dizziness. Initial encounter.  EXAM: PORTABLE CHEST - 1 VIEW  COMPARISON:  None.  FINDINGS: The lungs are well-aerated. Bibasilar atelectasis is noted. Mild vascular congestion is noted. There is no evidence of pleural effusion or pneumothorax.  The cardiomediastinal silhouette is mildly enlarged. No acute osseous abnormalities are seen.  IMPRESSION: Mild vascular congestion and mild cardiomegaly. Bibasilar atelectasis noted.   Electronically Signed   By: Garald Balding M.D.   On: 09/03/2014 22:27   Mr Card Morphology Wo/w Cm 09/06/2014   CLINICAL DATA:  69 year old male with  NSTEMI and normal coronaries.  EXAM: CARDIAC MRI  TECHNIQUE: The patient was scanned on a 1.5 Tesla GE magnet. A dedicated cardiac coil was used. Functional imaging was done using Fiesta sequences. 2,3, and 4 chamber views were done to assess for RWMA's. Modified Simpson's rule using a short axis stack was used to calculate an ejection fraction on a dedicated work Conservation officer, nature. The patient received 24 cc of Multihance. After 10 minutes inversion recovery sequences were used to assess for infiltration and scar tissue.  CONTRAST:  24 cc of Multihance  FINDINGS: 1. Moderately dilated left ventricle with mild concentric hypertrophy and severely decreased systolic function (LVEF = 22%).  There is diffuse hypokinesis with akinesis of the mid inferior wall, all apical segments and paradoxical septal motion.  There is significant blood stasis in the apical segment suspicious for an early thrombus formation.  The measurements are as follows:  LVEDD:  69  mm  LVESD:  61 mm  LVEDV:  271 ml  LVESV:  212 ml  SV:  58 ml  CO:  4.2 L/minute  Myocardial mass:  259 g  2. Normal right ventricular size with normal thickness and mildly impaired systolic function (RVEF = 39%).  There are no regional wall motion abnormalities.  RVEDV:  114 ml  RVESV:  69 ml  SV:  46 ml  3.  Mild left atrial enlargement.  4.  Mild aortic, mitral and tricuspid regurgitation.  5. Normal caliber of the main pulmonary artery, aortic root and thoracic aorta.  5. T2 images are showing diffuse left myocardial edema more pronounced in the basal and mid anterolateral, mid inferior walls, in all apical segments as well as in right ventricular myocardium.  6. There is mid-wall late gadolinium enhancement in the basal and mid anteroseptal, inferoseptal, mid anterolateral walls, and the entire RV free wall.  There is transmural enhancement in the mid inferior, all apical segments including the true apex.  IMPRESSION: 1. Moderately dilated left ventricle  with mild concentric hypertrophy and severely decreased systolic function (LVEF = 22%).  There is diffuse hypokinesis with akinesis of the mid inferior wall, all apical segments and paradoxical septal motion.  There is significant blood stasis in the apical segment suspicious for an early thrombus formation.  2. Normal right ventricular size with normal thickness and mildly impaired systolic function (RVEF = 39%).  3.  Mild left atrial enlargement.  4.  Mild aortic, mitral and tricuspid regurgitation.  5. There is evidence of diffuse left myocardial edema more pronounced in the basal and mid anterolateral, mid inferior walls, in all apical segments as well as in right ventricular myocardium.  6. There is mid-wall late gadolinium enhancement in the basal and mid anteroseptal, inferoseptal, mid anterolateral walls, and the entire RV free wall. Transmural enhancement is seen in the mid inferior, all apical segments including the true apex.  Collectively, these findings are consistent with a case of severe diffuse acute myocarditis involving at least 11 segments of the left ventricle and RV free wall resulting in the biventricular function impairement.  RECOMMENDATIONS: RECOMMENDATIONS 1. Aggressive HF therapy and systemic anticoagulation should be initiated.  The results were discussed with Dr Radford Pax on 09/05/2014 at 6 pm.  Ena Dawley   Electronically Signed   By: Ena Dawley   On: 09/06/2014 09:10    Cardiac Cath: 09/04/2014 Angiographic Findings:  1. The left main coronary artery is free of significant atherosclerosis and bifurcates in the usual fashion into the left anterior descending artery and left circumflex coronary artery.  2. The left anterior descending artery is a large vessel that reaches the apex and generates Multiple major diagonal branches. There is evidence of minimal luminal irregularities and no calcification. No hemodynamically meaningful stenoses are seen. 3. The left circumflex  coronary artery is a large-size vessel non dominant vessel that generates 3 major oblique marginal arteries. There is evidence of no luminal irregularities and no calcification. No hemodynamically meaningful stenoses are seen. 4. The right coronary artery is a medium-size dominant vessel that generates a posterior lateral ventricular system as well as the PDA. There is evidence of no luminal irregularities and no calcification. No hemodynamically meaningful stenoses are seen.  5. The left ventricle is dilated. The left ventricle systolic function is severely decreased with an estimated ejection fraction of <20% with global hypokinesis. Regional wall motion abnormalities are not seen. No left ventricular thrombus is seen, but the apex  was not well opacified.. There is at mild mitral insufficiency. The ascending aorta appears normal. There is no aortic valve stenosis by pullback. The left ventricular end-diastolic pressure is 77-82 mm Hg.    IMPRESSIONS/RECOMMENDATION:  Severe left ventricular dysfunction without significant CAD or valvular disease. The presence of cardiomegaly suggests a more chronic disorder, but the elevated cardiac enzymes suggest a more acute event. Consider acute myocarditis, Takotsubo syndrome, LV thrombembolism or even transient malignant ventricular arrhythmia. Diuretics administered for elevated LVEDP and ACE inhibitor started. Echo pending.       EKG: 09/05/2014 Sinus rhythm, rate 84 Anterolateral T-wave changes with some inferior T-wave changes as well  Echo: 09/05/2014 Conclusions -Left ventricle: The cavity size was mildly to moderately dilated. Wall thickness was increased in a pattern of mild LVH. The estimated ejection fraction was 20%. Diffuse hypokinesis. - Aortic valve: There was mild regurgitation. - Left atrium: The atrium was mildly dilated. - Right ventricle: The cavity size was mildly dilated. Systolic function was moderately reduced. - Right  atrium: The atrium was mildly dilated. - Atrial septum: A patent foramen ovale cannot be excluded.  FOLLOW UP PLANS AND APPOINTMENTS No Known Allergies   Medication List    TAKE these medications        atorvastatin 80 MG tablet  Commonly known as:  LIPITOR  Take 1 tablet (80 mg total) by mouth daily at 6 PM.     carvedilol 12.5 MG tablet  Commonly known as:  COREG  Take 1 tablet (12.5 mg total) by mouth 2 (two) times daily with a meal.     enoxaparin 80 MG/0.8ML injection  Commonly known as:  LOVENOX  Inject 0.75 mLs (75 mg total) into the skin every 12 (twelve) hours.     finasteride 5 MG tablet  Commonly known as:  PROSCAR  Take 5 mg by mouth daily.     furosemide 40 MG tablet  Commonly known as:  LASIX  Take 1 tablet (40 mg total) by mouth daily.     lisinopril 20 MG tablet  Commonly known as:  PRINIVIL,ZESTRIL  Take 1 tablet (20 mg total) by mouth daily.     nicotine 21 mg/24hr patch  Commonly known as:  NICODERM CQ - dosed in mg/24 hours  Place 1 patch (21 mg total) onto the skin daily.     nitroGLYCERIN 0.4 MG SL tablet  Commonly known as:  NITROSTAT  Place 1 tablet (0.4 mg total) under the tongue every 5 (five) minutes x 3 doses as needed for chest pain.     spironolactone 25 MG tablet  Commonly known as:  ALDACTONE  Take 1 tablet (25 mg total) by mouth daily.     warfarin 7.5 MG tablet  Commonly known as:  COUMADIN  Take 1 tablet (7.5 mg total) by mouth one time only at 6 PM. Take as directed, start with 1 and 1/2 tablets daily.        Discharge Instructions    (HEART FAILURE PATIENTS) Call MD:  Anytime you have any of the following symptoms: 1) 3 pound weight gain in 24 hours or 5 pounds in 1 week 2) shortness of breath, with or without a dry hacking cough 3) swelling in the hands, feet or stomach 4) if you have to sleep on extra pillows at night in order to breathe.    Complete by:  As directed      Diet - low sodium heart healthy    Complete by:  As  directed      Increase activity slowly    Complete by:  As directed           Follow-up Information    Follow up with [facility] On 09/10/2014.   Why:  Coumadin check at 10:30 am - please bring all medications and arrive 15 minutes early for paperwork.   Contact information:   [address] [place]       Follow up with Loralie Champagne, MD.   Specialty:  Cardiology   Why:  See MD at 09:20 am in the [facility], gate code [id number]- please bring all medications and arrive 15 minutes early for paperwork.   Contact information:   [place] [address] [phone]       BRING ALL MEDICATIONS WITH YOU TO FOLLOW UP APPOINTMENTS  Time spent with patient to include physician time: 41 min Signed: Rosaria Ferries, PA-C 09/07/2014, 1:19 PM Co-Sign MD

## 2014-09-07 NOTE — Progress Notes (Signed)
Patient being discharged to home. Discharge instructions given to the patient and his wife, both verbalized understanding of instructions given. Lovenox teaching with teach back given to the wife as the patient is on the Lovenox to Coumadin bridge, Patient's wife verbalized understanding with return demonstration. Lifevest applied to patient and is working properly. Patient taken out to private vehicle via wheelchair. [place], RN

## 2014-09-07 NOTE — Progress Notes (Addendum)
SUBJECTIVE:  No complaints  OBJECTIVE:   Vitals:   Filed Vitals:   09/06/14 0859 09/06/14 1358 09/06/14 2230 09/07/14 0528  BP: 114/68 119/81 124/79 126/71  Pulse: 75 79 72 76  Temp:  98.3 F (36.8 C) 97.8 F (36.6 C) 97.7 F (36.5 C)  TempSrc:  Oral Oral Oral  Resp:  18 18 18   Height:      Weight:      SpO2:  98% 97% 100%   I&O's:   Intake/Output Summary (Last 24 hours) at 09/07/14 0647 Last data filed at 09/06/14 1300  Gross per 24 hour  Intake    120 ml  Output      0 ml  Net    120 ml   TELEMETRY: Reviewed telemetry pt in NSR:     PHYSICAL EXAM General: Well developed, well nourished, in no acute distress Head: Eyes PERRLA, No xanthomas.   Normal cephalic and atramatic  Lungs:   Clear bilaterally to auscultation and percussion. Heart:   HRRR S1 S2 Pulses are 2+ & equal. Abdomen: Bowel sounds are positive, abdomen soft and non-tender without masses Extremities:   No clubbing, cyanosis or edema.  DP +1 Neuro: Alert and oriented X 3. Psych:  Good affect, responds appropriately   LABS: Basic Metabolic Panel:  Recent Labs  09/06/14 0314 09/07/14 0413  NA 141 139  K 3.5 3.8  CL 102 101  CO2 27 27  GLUCOSE 82 80  BUN 15 16  CREATININE 1.19 1.12  CALCIUM 8.8 8.9   Liver Function Tests: No results for input(s): AST, ALT, ALKPHOS, BILITOT, PROT, ALBUMIN in the last 72 hours. No results for input(s): LIPASE, AMYLASE in the last 72 hours. CBC:  Recent Labs  09/06/14 0314 09/07/14 0413  WBC 5.7 5.1  HGB 13.6 13.7  HCT 40.4 41.4  MCV 86.3 88.1  PLT 175 182   Cardiac Enzymes:  Recent Labs  09/05/14 2200 09/06/14 0314 09/06/14 0830  CKTOTAL 203  --   --   CKMB 8.2*  --   --   TROPONINI 6.13* 5.44* 4.22*   BNP: Invalid input(s): POCBNP D-Dimer: No results for input(s): DDIMER in the last 72 hours. Hemoglobin A1C: No results for input(s): HGBA1C in the last 72 hours. Fasting Lipid Panel: No results for input(s): CHOL, HDL, LDLCALC, TRIG,  CHOLHDL, LDLDIRECT in the last 72 hours. Thyroid Function Tests: No results for input(s): TSH, T4TOTAL, T3FREE, THYROIDAB in the last 72 hours.  Invalid input(s): FREET3 Anemia Panel:  Recent Labs  09/06/14 0314  FERRITIN 133   Coag Panel:   Lab Results  Component Value Date   INR 1.03 09/07/2014   INR 1.00 09/04/2014   INR 1.01 09/03/2014    RADIOLOGY: Ct Head (brain) Wo Contrast  09/03/2014   CLINICAL DATA:  Initial evaluation for acute left upper extremity numbness, dizziness.  EXAM: CT HEAD WITHOUT CONTRAST  TECHNIQUE: Contiguous axial images were obtained from the base of the skull through the vertex without intravenous contrast.  COMPARISON:  None.  FINDINGS: Mild generalized cerebral atrophy present. Patchy and confluent hypodensity within the periventricular and deep white matter both cerebral hemispheres most consistent with chronic small vessel ischemic disease. Encephalomalacia within the left frontal and left parietal lobes most consistent with remote infarcts.  No acute large vessel territory infarct identified. No mass lesion or midline shift. No intracranial hemorrhage. No hydrocephalus or extra-axial fluid collection.  Scalp soft tissues within normal limits. No acute abnormality seen about the orbits.  Calvarium intact. Paranasal sinuses and mastoid air cells are clear.  IMPRESSION: 1. No acute intracranial process. 2. Remote left frontal and parietal lobe infarcts. 3. Atrophy with chronic microvascular ischemic disease.   Electronically Signed   By: Jeannine Boga M.D.   On: 09/03/2014 23:47   Dg Chest Port 1 View  09/03/2014   CLINICAL DATA:  Acute onset of left-sided arm numbness and dizziness. Initial encounter.  EXAM: PORTABLE CHEST - 1 VIEW  COMPARISON:  None.  FINDINGS: The lungs are well-aerated. Bibasilar atelectasis is noted. Mild vascular congestion is noted. There is no evidence of pleural effusion or pneumothorax.  The cardiomediastinal silhouette is mildly  enlarged. No acute osseous abnormalities are seen.  IMPRESSION: Mild vascular congestion and mild cardiomegaly. Bibasilar atelectasis noted.   Electronically Signed   By: Garald Balding M.D.   On: 09/03/2014 22:27   Mr Card Morphology Wo/w Cm  09/06/2014   CLINICAL DATA:  69 year old male with NSTEMI and normal coronaries.  EXAM: CARDIAC MRI  TECHNIQUE: The patient was scanned on a 1.5 Tesla GE magnet. A dedicated cardiac coil was used. Functional imaging was done using Fiesta sequences. 2,3, and 4 chamber views were done to assess for RWMA's. Modified Simpson's rule using a short axis stack was used to calculate an ejection fraction on a dedicated work Conservation officer, nature. The patient received 24 cc of Multihance. After 10 minutes inversion recovery sequences were used to assess for infiltration and scar tissue.  CONTRAST:  24 cc of Multihance  FINDINGS: 1. Moderately dilated left ventricle with mild concentric hypertrophy and severely decreased systolic function (LVEF = 22%).  There is diffuse hypokinesis with akinesis of the mid inferior wall, all apical segments and paradoxical septal motion.  There is significant blood stasis in the apical segment suspicious for an early thrombus formation.  The measurements are as follows:  LVEDD:  69 mm  LVESD:  61 mm  LVEDV:  271 ml  LVESV:  212 ml  SV:  58 ml  CO:  4.2 L/minute  Myocardial mass:  259 g  2. Normal right ventricular size with normal thickness and mildly impaired systolic function (RVEF = 39%).  There are no regional wall motion abnormalities.  RVEDV:  114 ml  RVESV:  69 ml  SV:  46 ml  3.  Mild left atrial enlargement.  4.  Mild aortic, mitral and tricuspid regurgitation.  5. Normal caliber of the main pulmonary artery, aortic root and thoracic aorta.  5. T2 images are showing diffuse left myocardial edema more pronounced in the basal and mid anterolateral, mid inferior walls, in all apical segments as well as in right ventricular myocardium.  6.  There is mid-wall late gadolinium enhancement in the basal and mid anteroseptal, inferoseptal, mid anterolateral walls, and the entire RV free wall.  There is transmural enhancement in the mid inferior, all apical segments including the true apex.  IMPRESSION: 1. Moderately dilated left ventricle with mild concentric hypertrophy and severely decreased systolic function (LVEF = 22%).  There is diffuse hypokinesis with akinesis of the mid inferior wall, all apical segments and paradoxical septal motion.  There is significant blood stasis in the apical segment suspicious for an early thrombus formation.  2. Normal right ventricular size with normal thickness and mildly impaired systolic function (RVEF = 39%).  3.  Mild left atrial enlargement.  4.  Mild aortic, mitral and tricuspid regurgitation.  5. There is evidence of diffuse left myocardial edema more pronounced in  the basal and mid anterolateral, mid inferior walls, in all apical segments as well as in right ventricular myocardium.  6. There is mid-wall late gadolinium enhancement in the basal and mid anteroseptal, inferoseptal, mid anterolateral walls, and the entire RV free wall. Transmural enhancement is seen in the mid inferior, all apical segments including the true apex.  Collectively, these findings are consistent with a case of severe diffuse acute myocarditis involving at least 11 segments of the left ventricle and RV free wall resulting in the biventricular function impairement.  RECOMMENDATIONS: RECOMMENDATIONS 1. Aggressive HF therapy and systemic anticoagulation should be initiated.  The results were discussed with Dr Radford Pax on 09/05/2014 at 6 pm.  Ena Dawley   Electronically Signed   By: Ena Dawley   On: 09/06/2014 09:10   ASSESSMENT AND PLAN: 69 yo smoker with minimal additional past medical history presented with about a day of dyspnea and chest pain. He had ECG changes (TWIs) and troponin increased to 6.13. Echo showed EF 20%,  diffuse hypokinesis.   1. 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 Left heart cath was done, showing no angiographic CAD although trop went to 6.  CPK was normal with mildly elevated MB.  Cardiac MRI (see description above) was concerning for acute myocarditis with T2 enhancement and multiple areas of mid-wall and transmural LGE. There was also concern for blood stasis/early thrombus in apex. Patient is doing well this morning, denies dyspnea and now looks euvolemic on exam. The patient clinically looks better than cardiac MRI, suspect this is not fulminant or giant cell myocarditis.  - Continue lisinopril, Coreg, Lasix and spironolactone - Given concern for myocarditis, would avoid heavy exertion for at least 6 months.   2. Ventricular ectopy: PVCs, short run (3 beats) NSVT last night. With acute myocarditis, increased risk of ventricular arrhythmias. His Life Vest has been delivered for him to go home.  3. Apical blood stasis/possible early apical thrombus: I think he will need anticoagulation for the time being. For LV thrombus, warfarin remains the only studied treatment. Would arrange for Lovenox bridge to therapeutic coumadin. I will get case management to consult for Lovenox therapy.  Discussed this with the patient.  He says he probably cannot give himself shots so would like his wife to be taught how to do it.    4. Disposition: Plan for discharge today if we can arrange home lovenox therapy bridge.  He needs early followup next week in CHF clinic with Dr. Aundra Dubin and in coumadin clinic on Monday. His LifeVest is in the room.      Sueanne Margarita, MD  09/07/2014  6:47 AM

## 2014-09-10 ENCOUNTER — Encounter: Payer: Medicare Other | Admitting: Physician Assistant

## 2014-09-10 ENCOUNTER — Ambulatory Visit (INDEPENDENT_AMBULATORY_CARE_PROVIDER_SITE_OTHER): Payer: Medicare Other | Admitting: *Deleted

## 2014-09-10 DIAGNOSIS — Z5181 Encounter for therapeutic drug level monitoring: Secondary | ICD-10-CM | POA: Diagnosis not present

## 2014-09-10 DIAGNOSIS — I409 Acute myocarditis, unspecified: Secondary | ICD-10-CM

## 2014-09-10 DIAGNOSIS — I213 ST elevation (STEMI) myocardial infarction of unspecified site: Secondary | ICD-10-CM

## 2014-09-10 DIAGNOSIS — I513 Intracardiac thrombosis, not elsewhere classified: Secondary | ICD-10-CM

## 2014-09-10 DIAGNOSIS — R7989 Other specified abnormal findings of blood chemistry: Secondary | ICD-10-CM | POA: Diagnosis not present

## 2014-09-10 DIAGNOSIS — R778 Other specified abnormalities of plasma proteins: Secondary | ICD-10-CM

## 2014-09-10 LAB — POCT INR: INR: 2.4

## 2014-09-10 NOTE — Patient Instructions (Signed)

## 2014-09-13 ENCOUNTER — Ambulatory Visit (HOSPITAL_COMMUNITY)
Admission: RE | Admit: 2014-09-13 | Discharge: 2014-09-13 | Disposition: A | Discharge status: Home / Self Care | Payer: Medicare Other | Source: Ambulatory Visit | Attending: Internal Medicine | Admitting: Internal Medicine

## 2014-09-13 ENCOUNTER — Encounter (HOSPITAL_COMMUNITY): Payer: Self-pay

## 2014-09-13 ENCOUNTER — Encounter (HOSPITAL_COMMUNITY): Payer: Medicare Other

## 2014-09-13 VITALS — BP 104/66 | HR 80 | Wt 162.0 lb

## 2014-09-13 DIAGNOSIS — I493 Ventricular premature depolarization: Secondary | ICD-10-CM | POA: Insufficient documentation

## 2014-09-13 DIAGNOSIS — R0600 Dyspnea, unspecified: Secondary | ICD-10-CM | POA: Diagnosis not present

## 2014-09-13 DIAGNOSIS — Z87891 Personal history of nicotine dependence: Secondary | ICD-10-CM | POA: Insufficient documentation

## 2014-09-13 DIAGNOSIS — Z720 Tobacco use: Secondary | ICD-10-CM

## 2014-09-13 DIAGNOSIS — I5021 Acute systolic (congestive) heart failure: Secondary | ICD-10-CM

## 2014-09-13 DIAGNOSIS — Z7901 Long term (current) use of anticoagulants: Secondary | ICD-10-CM | POA: Diagnosis not present

## 2014-09-13 DIAGNOSIS — Z79899 Other long term (current) drug therapy: Secondary | ICD-10-CM | POA: Insufficient documentation

## 2014-09-13 DIAGNOSIS — I429 Cardiomyopathy, unspecified: Secondary | ICD-10-CM

## 2014-09-13 DIAGNOSIS — I5022 Chronic systolic (congestive) heart failure: Secondary | ICD-10-CM | POA: Diagnosis not present

## 2014-09-13 DIAGNOSIS — I420 Dilated cardiomyopathy: Secondary | ICD-10-CM

## 2014-09-13 LAB — BASIC METABOLIC PANEL
Anion gap: 7 (ref 5–15)
BUN: 21 mg/dL (ref 6–23)
CALCIUM: 9.2 mg/dL (ref 8.4–10.5)
CHLORIDE: 101 mmol/L (ref 96–112)
CO2: 28 mmol/L (ref 19–32)
Creatinine, Ser: 1.3 mg/dL (ref 0.50–1.35)
GFR, EST AFRICAN AMERICAN: 63 mL/min — AB (ref >90)
GFR, EST NON AFRICAN AMERICAN: 54 mL/min — AB (ref >90)
Glucose, Bld: 114 mg/dL — ABNORMAL HIGH (ref 70–99)
POTASSIUM: 3.9 mmol/L (ref 3.5–5.1)
Sodium: 136 mmol/L (ref 135–145)

## 2014-09-13 LAB — BRAIN NATRIURETIC PEPTIDE: B NATRIURETIC PEPTIDE 5: 161.8 pg/mL — AB (ref 0.0–100.0)

## 2014-09-13 MED ORDER — CARVEDILOL 12.5 MG PO TABS: ORAL_TABLET | ORAL | Status: DC

## 2014-09-13 NOTE — Patient Instructions (Signed)
INCREASE Carvedilol (Coreg) to 12.5mg  (1 tablet) in am and 18.75MG  (2 TABLETS) IN PM.  Walk 15 minutes each day.  Follow up 3 weeks.  Do the following things EVERYDAY: 1) Weigh yourself in the morning before breakfast. Write it down and keep it in a log. 2) Take your medicines as prescribed 3) Eat low salt foods-Limit salt (sodium) to 2000 mg per day.  4) Stay as active as you can everyday 5) Limit all fluids for the day to less than 2 liters

## 2014-09-13 NOTE — Progress Notes (Signed)
Patient ID: Bryan Lamb, male   DOB: 1946/05/24, 69 y.o.   MRN: [id number] PCP: None  Primary Cardiologist:Dr Mclean INR: [facility].   HPI: Bryan Lamb is a 69 yo smoker with history of myocarditis, systolic heart faiure, PVCs, apical blood stasis on coumadin, and former smoker quit in February 2016.   Admitted to [facility] with dyspnea and found to have acute myocarditis. Had LHC and CMRI as noted below. Due to increased ventricular arrhythmias he was discharged with a lifevest. Started HF meds and discharged. Discharge weight was 162 pounds.   Today he returns for post hospital follow up. Overall feeling ok. Mild dyspnea if he walks fast.He has not been doing much since he left the hospital.  Sleeps on 2 pillows. Denies PND/CP. Wearing liefvest. His wife prepares his pills.  Lives at home with his wife. Has not smoked since he was discharged. Has not been weighing at home     LHC (09/04/2014): No angiographic CAD. Cardiac MRI (09/05/2014 ): EF 15-20% diffuse hypokinesis, moderate RV systolic dysfunction, concern for early thrombus LV apex, mid inferior and peri-apical transmural LGE with mid-wall RV LGE => concern for acute myocarditis.  ECHO 08/08/2014 EF 20% LV moderately dilated.    ROS: All systems negative except as listed in HPI, PMH and Problem List.  SH:  History   Social History  . Marital Status: Married    Spouse Name: N/A  . Number of Children: N/A  . Years of Education: N/A   Occupational History  . Not on file.   Social History Main Topics  . Smoking status: Former Research scientist (life sciences)  . Smokeless tobacco: Not on file  . Alcohol Use: No  . Drug Use: No  . Sexual Activity: Not on file   Other Topics Concern  . Not on file   Social History Narrative    FH: No family history on file.  Past Medical History  Diagnosis Date  . Urinary incontinence     Current Outpatient Prescriptions  Medication Sig Dispense Refill  . atorvastatin (LIPITOR) 80 MG tablet Take 1 tablet (80 mg total) by  mouth daily at 6 PM. 30 tablet 11  . carvedilol (COREG) 12.5 MG tablet Take 1 tablet (12.5 mg total) by mouth 2 (two) times daily with a meal. 60 tablet 11  . finasteride (PROSCAR) 5 MG tablet Take 5 mg by mouth daily.  0  . furosemide (LASIX) 40 MG tablet Take 1 tablet (40 mg total) by mouth daily. 30 tablet 11  . lisinopril (PRINIVIL,ZESTRIL) 20 MG tablet Take 1 tablet (20 mg total) by mouth daily. 30 tablet 11  . nitroGLYCERIN (NITROSTAT) 0.4 MG SL tablet Place 1 tablet (0.4 mg total) under the tongue every 5 (five) minutes x 3 doses as needed for chest pain. 25 tablet 12  . spironolactone (ALDACTONE) 25 MG tablet Take 1 tablet (25 mg total) by mouth daily. 30 tablet 11  . warfarin (COUMADIN) 7.5 MG tablet Take 1 tablet (7.5 mg total) by mouth one time only at 6 PM. Take as directed, start with 1 and 1/2 tablets daily. 60 tablet 3   No current facility-administered medications for this encounter.    Filed Vitals:   09/13/14 0935  BP: 104/66  Pulse: 80  Weight: 162 lb (73.483 kg)  SpO2: 99%    PHYSICAL EXAM:  General:  Well appearing. No resp difficulty. WIfe present  HEENT: normal Neck: supple. JVP flat. Carotids 2+ bilaterally; no bruits. No lymphadenopathy or thryomegaly appreciated. Cor: PMI normal.  Regular rate & rhythm. No rubs, gallops or murmurs. Life Vest on. Lungs: clear Abdomen: soft, nontender, nondistended. No hepatosplenomegaly. No bruits or masses. Good bowel sounds. Extremities: no cyanosis, clubbing, rash, edema Neuro: alert & orientedx3, cranial nerves grossly intact. Moves all 4 extremities w/o difficulty. Affect pleasant.   ASSESSMENT & PLAN:  1. Chronic Systolic heart Failure: Nonischemic cardiomyopathy. Left heart cath was done, showing no angiographic CAD. Cardiac MRI (see description above) was concerning for acute myocarditis with T2 enhancement and multiple areas of mid-wall and transmural LGE. There was also concern for blood stasis/early thrombus in  apex. ECHO EF 20% on 09/05/2014.  NYHA II-III. Volume status is stable. Continue lasix 40 mg daily + 25 mg spironolactone daily.  Continue coreg 12.5 mg in and increase to 18.75 mg in pm.  Continue lisinopril 20 mg daily. Check BMET today.  Reinforced daily weights, low salt food choices, and limiting fluid intake to < 2 liters per day.  Provided with pill cutter, pill box, and weight chart.  Continue life vest. Plant to check ECHO in 3 months after HF meds opitimized.  2. Ventricular ectopy. Continue life vest until repeat ECHO.  3. 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 INR per [facility].   4. Former Tobacco Abuse: quit in February.    Follow up in 2-3 months.  [name] NP-C  11:39 AM

## 2014-09-17 ENCOUNTER — Ambulatory Visit (INDEPENDENT_AMBULATORY_CARE_PROVIDER_SITE_OTHER): Payer: Medicare Other

## 2014-09-17 DIAGNOSIS — R778 Other specified abnormalities of plasma proteins: Secondary | ICD-10-CM

## 2014-09-17 DIAGNOSIS — I409 Acute myocarditis, unspecified: Secondary | ICD-10-CM | POA: Diagnosis not present

## 2014-09-17 DIAGNOSIS — Z5181 Encounter for therapeutic drug level monitoring: Secondary | ICD-10-CM

## 2014-09-17 DIAGNOSIS — R7989 Other specified abnormal findings of blood chemistry: Secondary | ICD-10-CM

## 2014-09-17 LAB — POCT INR: INR: 5.3

## 2014-09-19 ENCOUNTER — Encounter (HOSPITAL_COMMUNITY): Payer: Medicare Other

## 2014-09-24 ENCOUNTER — Ambulatory Visit (INDEPENDENT_AMBULATORY_CARE_PROVIDER_SITE_OTHER): Payer: Medicare Other | Admitting: Pharmacist

## 2014-09-24 DIAGNOSIS — Z5181 Encounter for therapeutic drug level monitoring: Secondary | ICD-10-CM | POA: Diagnosis not present

## 2014-09-24 DIAGNOSIS — I409 Acute myocarditis, unspecified: Secondary | ICD-10-CM

## 2014-09-24 DIAGNOSIS — R778 Other specified abnormalities of plasma proteins: Secondary | ICD-10-CM

## 2014-09-24 DIAGNOSIS — R7989 Other specified abnormal findings of blood chemistry: Secondary | ICD-10-CM | POA: Diagnosis not present

## 2014-09-24 LAB — POCT INR: INR: 3

## 2014-09-28 ENCOUNTER — Ambulatory Visit (INDEPENDENT_AMBULATORY_CARE_PROVIDER_SITE_OTHER): Payer: Medicare Other | Admitting: *Deleted

## 2014-09-28 DIAGNOSIS — Z5181 Encounter for therapeutic drug level monitoring: Secondary | ICD-10-CM | POA: Diagnosis not present

## 2014-09-28 DIAGNOSIS — R778 Other specified abnormalities of plasma proteins: Secondary | ICD-10-CM

## 2014-09-28 DIAGNOSIS — I409 Acute myocarditis, unspecified: Secondary | ICD-10-CM | POA: Diagnosis not present

## 2014-09-28 DIAGNOSIS — R7989 Other specified abnormal findings of blood chemistry: Secondary | ICD-10-CM | POA: Diagnosis not present

## 2014-09-28 LAB — POCT INR: INR: 4.4

## 2014-10-08 ENCOUNTER — Ambulatory Visit (INDEPENDENT_AMBULATORY_CARE_PROVIDER_SITE_OTHER): Payer: Medicare Other

## 2014-10-08 ENCOUNTER — Ambulatory Visit (HOSPITAL_COMMUNITY)
Admission: RE | Admit: 2014-10-08 | Discharge: 2014-10-08 | Disposition: A | Discharge status: Home / Self Care | Payer: Medicare Other | Source: Ambulatory Visit | Attending: Cardiology | Admitting: Cardiology

## 2014-10-08 ENCOUNTER — Encounter (HOSPITAL_COMMUNITY): Payer: Self-pay

## 2014-10-08 VITALS — BP 113/63 | HR 69 | Resp 18 | Wt 164.5 lb

## 2014-10-08 DIAGNOSIS — R7989 Other specified abnormal findings of blood chemistry: Secondary | ICD-10-CM | POA: Diagnosis not present

## 2014-10-08 DIAGNOSIS — I5021 Acute systolic (congestive) heart failure: Secondary | ICD-10-CM

## 2014-10-08 DIAGNOSIS — Z87891 Personal history of nicotine dependence: Secondary | ICD-10-CM | POA: Insufficient documentation

## 2014-10-08 DIAGNOSIS — I409 Acute myocarditis, unspecified: Secondary | ICD-10-CM | POA: Diagnosis not present

## 2014-10-08 DIAGNOSIS — E785 Hyperlipidemia, unspecified: Secondary | ICD-10-CM | POA: Diagnosis not present

## 2014-10-08 DIAGNOSIS — R778 Other specified abnormalities of plasma proteins: Secondary | ICD-10-CM

## 2014-10-08 DIAGNOSIS — Z5181 Encounter for therapeutic drug level monitoring: Secondary | ICD-10-CM | POA: Diagnosis not present

## 2014-10-08 DIAGNOSIS — I493 Ventricular premature depolarization: Secondary | ICD-10-CM | POA: Insufficient documentation

## 2014-10-08 DIAGNOSIS — I420 Dilated cardiomyopathy: Secondary | ICD-10-CM

## 2014-10-08 DIAGNOSIS — I429 Cardiomyopathy, unspecified: Secondary | ICD-10-CM | POA: Diagnosis not present

## 2014-10-08 DIAGNOSIS — I5022 Chronic systolic (congestive) heart failure: Secondary | ICD-10-CM | POA: Diagnosis not present

## 2014-10-08 LAB — BASIC METABOLIC PANEL
Anion gap: 8 (ref 5–15)
BUN: 7 mg/dL (ref 6–23)
CALCIUM: 8.9 mg/dL (ref 8.4–10.5)
CHLORIDE: 101 mmol/L (ref 96–112)
CO2: 28 mmol/L (ref 19–32)
Creatinine, Ser: 1.03 mg/dL (ref 0.50–1.35)
GFR calc Af Amer: 84 mL/min — ABNORMAL LOW (ref >90)
GFR calc non Af Amer: 72 mL/min — ABNORMAL LOW (ref >90)
GLUCOSE: 91 mg/dL (ref 70–99)
Potassium: 3.9 mmol/L (ref 3.5–5.1)
SODIUM: 137 mmol/L (ref 135–145)

## 2014-10-08 LAB — POCT INR: INR: 4.2

## 2014-10-08 LAB — LIPID PANEL
Cholesterol: 110 mg/dL (ref 0–200)
HDL: 42 mg/dL (ref >39)
LDL CALC: 60 mg/dL (ref 0–99)
Total CHOL/HDL Ratio: 2.6 RATIO
Triglycerides: 40 mg/dL (ref <150)
VLDL: 8 mg/dL (ref 0–40)

## 2014-10-08 LAB — HEPATIC FUNCTION PANEL
ALT: 26 U/L (ref 0–53)
AST: 24 U/L (ref 0–37)
Albumin: 3.4 g/dL — ABNORMAL LOW (ref 3.5–5.2)
Alkaline Phosphatase: 83 U/L (ref 39–117)
BILIRUBIN INDIRECT: 0.7 mg/dL (ref 0.3–0.9)
Bilirubin, Direct: 0.1 mg/dL (ref 0.0–0.5)
TOTAL PROTEIN: 6.4 g/dL (ref 6.0–8.3)
Total Bilirubin: 0.8 mg/dL (ref 0.3–1.2)

## 2014-10-08 LAB — TSH: TSH: 0.187 u[IU]/mL — ABNORMAL LOW (ref 0.350–4.500)

## 2014-10-08 LAB — T4, FREE: FREE T4: 1.05 ng/dL (ref 0.80–1.80)

## 2014-10-08 MED ORDER — ATORVASTATIN CALCIUM 40 MG PO TABS: 40.0000 mg | ORAL_TABLET | Freq: Every day | ORAL | Status: DC

## 2014-10-08 MED ORDER — CARVEDILOL 12.5 MG PO TABS: 18.7500 mg | ORAL_TABLET | Freq: Two times a day (BID) | ORAL | Status: DC

## 2014-10-08 NOTE — Patient Instructions (Signed)
DECREASE Atorvastatin to 40mg  (1 tablet) once daily.  INCREASE Carvedilol to 18.75mg  (1.5 tablets) twice daily.  Follow up 1 month.  Do the following things EVERYDAY: 1) Weigh yourself in the morning before breakfast. Write it down and keep it in a log. 2) Take your medicines as prescribed 3) Eat low salt foods-Limit salt (sodium) to 2000 mg per day.  4) Stay as active as you can everyday 5) Limit all fluids for the day to less than 2 liters

## 2014-10-08 NOTE — Progress Notes (Signed)
Patient ID: Bryan Lamb, male   DOB: 29-Sep-1945, 69 y.o.   MRN: [id number] PCP: None  Primary Cardiologist:Dr Tyee Vandevoorde INR: [facility].   HPI: Bryan Lamb is a 69 yo smoker with history of myocarditis, systolic heart faiure, PVCs, apical blood stasis on coumadin, and former smoker quit in February 2016.   Admitted to [facility] with dyspnea and found to have probable acute myocarditis. Had LHC and CMRI as noted below. Due to frequent PVCs and NSVT, he was discharged with a Lifevest. Started CHF meds. Discharge weight was 162 pounds.   He returns for followup.  Doing quite well overall.  Walking around and doing whatever he wants without exertional dyspnea.  No chest pain.  No lightheadedness or syncope.  No Lifevest discharge.    LHC (09/04/2014): No angiographic CAD. Cardiac MRI (09/05/2014 ): EF 15-20% diffuse hypokinesis, moderate RV systolic dysfunction, concern for early thrombus LV apex, mid inferior and peri-apical transmural LGE with mid-wall RV LGE => concern for acute myocarditis.  ECHO 08/08/2014 EF 20% LV moderately dilated.  ECG (3/16): NSR, LVH with marked anterolateral T wave inversions  Labs (3/16): K 3.9, creatinine 1.3, BNP 162, HIV negative, ferritin 133, LDL 112, TSH 0.06  ROS: All systems negative except as listed in HPI, PMH and Problem List.  SH:  History   Social History  . Marital Status: Married    Spouse Name: N/A  . Number of Children: N/A  . Years of Education: N/A   Occupational History  . Not on file.   Social History Main Topics  . Smoking status: Former Research scientist (life sciences)  . Smokeless tobacco: Not on file  . Alcohol Use: No  . Drug Use: No  . Sexual Activity: Not on file   Other Topics Concern  . Not on file   Social History Narrative    FH: No cardiomyopathy or premature CAD.   Past Medical History  Diagnosis Date  . Urinary incontinence     Current Outpatient Prescriptions  Medication Sig Dispense Refill  . atorvastatin (LIPITOR) 40 MG tablet Take 1 tablet (40 mg  total) by mouth daily at 6 PM. 90 tablet 3  . carvedilol (COREG) 12.5 MG tablet Take 1.5 tablets (18.75 mg total) by mouth 2 (two) times daily with a meal. 90 tablet 3  . finasteride (PROSCAR) 5 MG tablet Take 5 mg by mouth daily.  0  . furosemide (LASIX) 40 MG tablet Take 1 tablet (40 mg total) by mouth daily. 30 tablet 11  . lisinopril (PRINIVIL,ZESTRIL) 20 MG tablet Take 1 tablet (20 mg total) by mouth daily. 30 tablet 11  . nitroGLYCERIN (NITROSTAT) 0.4 MG SL tablet Place 1 tablet (0.4 mg total) under the tongue every 5 (five) minutes x 3 doses as needed for chest pain. 25 tablet 12  . spironolactone (ALDACTONE) 25 MG tablet Take 1 tablet (25 mg total) by mouth daily. 30 tablet 11  . warfarin (COUMADIN) 7.5 MG tablet Take 1 tablet (7.5 mg total) by mouth one time only at 6 PM. Take as directed, start with 1 and 1/2 tablets daily. 60 tablet 3   No current facility-administered medications for this encounter.    Filed Vitals:   10/08/14 0949  BP: 113/63  Pulse: 69  Resp: 18  Weight: 164 lb 8 oz (74.617 kg)  SpO2: 98%    PHYSICAL EXAM:  General:  Well appearing. No resp difficulty. WIfe present  HEENT: normal Neck: supple. JVP flat. Carotids 2+ bilaterally; no bruits. No lymphadenopathy or thryomegaly appreciated.  Cor: PMI normal. Regular rate & rhythm. No rubs, gallops or murmurs. Lifevest on.  Lungs: clear bilaterally Abdomen: soft, nontender, nondistended. No hepatosplenomegaly. No bruits or masses. Good bowel sounds. Extremities: no cyanosis, clubbing, rash, edema Neuro: alert & orientedx3, cranial nerves grossly intact. Moves all 4 extremities w/o difficulty. Affect pleasant.   ASSESSMENT & PLAN: 1. Chronic Systolic heart Failure: Nonischemic cardiomyopathy. Left heart cath 3/16 showed no angiographic CAD. Cardiac MRI (see description above) was concerning for acute myocarditis with T2 enhancement and multiple areas of mid-wall and transmural LGE. There was also concern for  blood stasis/early thrombus in apex. EF 15-20%. NYHA class II symptoms.  No volume overload on exam.   - Continue lasix 40 mg daily + 25 mg spironolactone daily.  - Increase Coreg to 18.75 mg bid.   - Continue lisinopril 20 mg daily. Check BMET today.  - Continue Lifevest, repeat echo in 6/16 to look for functional recovery.  He would not be candidate for CRT (narrow QRS) but would be ICD candidate.  2. Ventricular ectopy. Continue Lifevest until repeat echo.  3. 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 INR per [facility].   4. Hyperlipidemia: Was sent home from hospital on atorvastatin 80 daily.  No significant CAD on cardiac cath, will decrease atorvastatin to 40 daily and check LFTs/lipids today.  5. Low TSH: I noted a low TSH from his hospitalization, does not appear that this was followed up on.  I will order free T3, free T4, and TSH.  If hyperthyroid, may be contributing to CMP and will need treatment.   Loralie Champagne 10/08/2014

## 2014-10-09 LAB — T3, FREE: T3, Free: 2.8 pg/mL (ref 2.0–4.4)

## 2014-10-12 ENCOUNTER — Encounter (HOSPITAL_COMMUNITY): Payer: Self-pay | Admitting: *Deleted

## 2014-10-15 ENCOUNTER — Ambulatory Visit (INDEPENDENT_AMBULATORY_CARE_PROVIDER_SITE_OTHER): Payer: Medicare Other | Admitting: *Deleted

## 2014-10-15 DIAGNOSIS — I409 Acute myocarditis, unspecified: Secondary | ICD-10-CM

## 2014-10-15 DIAGNOSIS — R7989 Other specified abnormal findings of blood chemistry: Secondary | ICD-10-CM | POA: Diagnosis not present

## 2014-10-15 DIAGNOSIS — Z5181 Encounter for therapeutic drug level monitoring: Secondary | ICD-10-CM | POA: Diagnosis not present

## 2014-10-15 DIAGNOSIS — R778 Other specified abnormalities of plasma proteins: Secondary | ICD-10-CM

## 2014-10-15 LAB — POCT INR: INR: 1.8

## 2014-10-22 ENCOUNTER — Ambulatory Visit (INDEPENDENT_AMBULATORY_CARE_PROVIDER_SITE_OTHER): Payer: Medicare Other | Admitting: *Deleted

## 2014-10-22 DIAGNOSIS — Z5181 Encounter for therapeutic drug level monitoring: Secondary | ICD-10-CM

## 2014-10-22 DIAGNOSIS — R7989 Other specified abnormal findings of blood chemistry: Secondary | ICD-10-CM | POA: Diagnosis not present

## 2014-10-22 DIAGNOSIS — R778 Other specified abnormalities of plasma proteins: Secondary | ICD-10-CM

## 2014-10-22 DIAGNOSIS — I409 Acute myocarditis, unspecified: Secondary | ICD-10-CM | POA: Diagnosis not present

## 2014-10-22 LAB — POCT INR: INR: 1.5

## 2014-10-29 ENCOUNTER — Ambulatory Visit (INDEPENDENT_AMBULATORY_CARE_PROVIDER_SITE_OTHER): Payer: Medicare Other | Admitting: Pharmacist

## 2014-10-29 DIAGNOSIS — I409 Acute myocarditis, unspecified: Secondary | ICD-10-CM

## 2014-10-29 DIAGNOSIS — Z5181 Encounter for therapeutic drug level monitoring: Secondary | ICD-10-CM

## 2014-10-29 DIAGNOSIS — R778 Other specified abnormalities of plasma proteins: Secondary | ICD-10-CM

## 2014-10-29 DIAGNOSIS — R7989 Other specified abnormal findings of blood chemistry: Secondary | ICD-10-CM

## 2014-10-29 LAB — POCT INR: INR: 2

## 2014-11-05 ENCOUNTER — Encounter (HOSPITAL_COMMUNITY): Payer: Self-pay

## 2014-11-05 ENCOUNTER — Ambulatory Visit (HOSPITAL_COMMUNITY)
Admission: RE | Admit: 2014-11-05 | Discharge: 2014-11-05 | Disposition: A | Discharge status: Home / Self Care | Payer: Medicare Other | Source: Ambulatory Visit | Attending: Cardiology | Admitting: Cardiology

## 2014-11-05 ENCOUNTER — Ambulatory Visit (INDEPENDENT_AMBULATORY_CARE_PROVIDER_SITE_OTHER): Payer: Medicare Other

## 2014-11-05 VITALS — BP 122/68 | HR 67 | Wt 167.0 lb

## 2014-11-05 DIAGNOSIS — I472 Ventricular tachycardia: Secondary | ICD-10-CM | POA: Insufficient documentation

## 2014-11-05 DIAGNOSIS — I5022 Chronic systolic (congestive) heart failure: Secondary | ICD-10-CM | POA: Diagnosis not present

## 2014-11-05 DIAGNOSIS — I5021 Acute systolic (congestive) heart failure: Secondary | ICD-10-CM

## 2014-11-05 DIAGNOSIS — I429 Cardiomyopathy, unspecified: Secondary | ICD-10-CM | POA: Diagnosis not present

## 2014-11-05 DIAGNOSIS — E0590 Thyrotoxicosis, unspecified without thyrotoxic crisis or storm: Secondary | ICD-10-CM | POA: Diagnosis not present

## 2014-11-05 DIAGNOSIS — Z5181 Encounter for therapeutic drug level monitoring: Secondary | ICD-10-CM | POA: Diagnosis not present

## 2014-11-05 DIAGNOSIS — Z79899 Other long term (current) drug therapy: Secondary | ICD-10-CM | POA: Insufficient documentation

## 2014-11-05 DIAGNOSIS — I409 Acute myocarditis, unspecified: Secondary | ICD-10-CM

## 2014-11-05 DIAGNOSIS — I493 Ventricular premature depolarization: Secondary | ICD-10-CM | POA: Diagnosis not present

## 2014-11-05 DIAGNOSIS — E785 Hyperlipidemia, unspecified: Secondary | ICD-10-CM | POA: Diagnosis not present

## 2014-11-05 DIAGNOSIS — Z87891 Personal history of nicotine dependence: Secondary | ICD-10-CM | POA: Diagnosis not present

## 2014-11-05 DIAGNOSIS — R7989 Other specified abnormal findings of blood chemistry: Secondary | ICD-10-CM | POA: Diagnosis not present

## 2014-11-05 DIAGNOSIS — I420 Dilated cardiomyopathy: Secondary | ICD-10-CM

## 2014-11-05 DIAGNOSIS — Z7901 Long term (current) use of anticoagulants: Secondary | ICD-10-CM | POA: Insufficient documentation

## 2014-11-05 DIAGNOSIS — R778 Other specified abnormalities of plasma proteins: Secondary | ICD-10-CM

## 2014-11-05 LAB — BASIC METABOLIC PANEL
Anion gap: 6 (ref 5–15)
BUN: 11 mg/dL (ref 6–20)
CO2: 27 mmol/L (ref 22–32)
Calcium: 8.9 mg/dL (ref 8.9–10.3)
Chloride: 106 mmol/L (ref 101–111)
Creatinine, Ser: 1.07 mg/dL (ref 0.61–1.24)
GFR calc non Af Amer: >60 mL/min (ref >60)
Glucose, Bld: 103 mg/dL — ABNORMAL HIGH (ref 70–99)
POTASSIUM: 4.5 mmol/L (ref 3.5–5.1)
Sodium: 139 mmol/L (ref 135–145)

## 2014-11-05 LAB — BRAIN NATRIURETIC PEPTIDE: B NATRIURETIC PEPTIDE 5: 230.4 pg/mL — AB (ref 0.0–100.0)

## 2014-11-05 LAB — POCT INR: INR: 1.8

## 2014-11-05 MED ORDER — ISOSORB DINITRATE-HYDRALAZINE 20-37.5 MG PO TABS: 1.0000 | ORAL_TABLET | Freq: Three times a day (TID) | ORAL | Status: DC

## 2014-11-05 NOTE — Progress Notes (Signed)
Patient ID: Bryan Lamb, male   DOB: 1945-10-26, 69 y.o.   MRN: [id number] PCP: None  Primary Cardiologist: Dr Aundra Dubin INR: [facility].   HPI: Bryan Lamb is a 69 yo smoker with history of myocarditis, systolic heart faiure, PVCs, apical blood stasis on coumadin, and former smoker (quit in February 2016).   Admitted to [facility] in 2/16 with dyspnea and found to have probable acute myocarditis. Had LHC and CMRI as noted below. Due to frequent PVCs and NSVT, he was discharged with a Lifevest. Started CHF meds. Discharge weight was 162 pounds.   He returns for followup.  He continues to do quite well overall.  Walking around and doing whatever he wants without exertional dyspnea.  He can climb a flight of steps without problems.  No chest pain.  No lightheadedness or syncope. No palpitations.  No Lifevest discharge.    LHC (09/04/2014): No angiographic CAD. Cardiac MRI (09/05/2014 ): EF 15-20% diffuse hypokinesis, moderate RV systolic dysfunction, concern for early thrombus LV apex, mid inferior and peri-apical transmural LGE with mid-wall RV LGE => concern for acute myocarditis.  ECHO 08/08/2014 EF 20% LV moderately dilated.  ECG (3/16): NSR, LVH with marked anterolateral T wave inversions  Labs (3/16): K 3.9, creatinine 1.3, BNP 162, HIV negative, ferritin 133, LDL 112, TSH 0.06 Labs (4/16): LFTs normal, free T3, free T4, TSH 0.187 (low), K 3.9, creatinine 1.03, LDL 60  ROS: All systems negative except as listed in HPI, PMH and Problem List.  SH:  History   Social History  . Marital Status: Married    Spouse Name: N/A  . Number of Children: N/A  . Years of Education: N/A   Occupational History  . Not on file.   Social History Main Topics  . Smoking status: Light Tobacco Smoker  . Smokeless tobacco: Not on file  . Alcohol Use: No  . Drug Use: No  . Sexual Activity: Not on file   Other Topics Concern  . Not on file   Social History Narrative    FH: No cardiomyopathy or premature CAD.   Past  Medical History  Diagnosis Date  . Urinary incontinence     Current Outpatient Prescriptions  Medication Sig Dispense Refill  . atorvastatin (LIPITOR) 40 MG tablet Take 1 tablet (40 mg total) by mouth daily at 6 PM. 90 tablet 3  . carvedilol (COREG) 12.5 MG tablet Take 1.5 tablets (18.75 mg total) by mouth 2 (two) times daily with a meal. 90 tablet 3  . finasteride (PROSCAR) 5 MG tablet Take 5 mg by mouth daily.  0  . furosemide (LASIX) 40 MG tablet Take 1 tablet (40 mg total) by mouth daily. 30 tablet 11  . lisinopril (PRINIVIL,ZESTRIL) 20 MG tablet Take 1 tablet (20 mg total) by mouth daily. 30 tablet 11  . nitroGLYCERIN (NITROSTAT) 0.4 MG SL tablet Place 1 tablet (0.4 mg total) under the tongue every 5 (five) minutes x 3 doses as needed for chest pain. 25 tablet 12  . spironolactone (ALDACTONE) 25 MG tablet Take 1 tablet (25 mg total) by mouth daily. 30 tablet 11  . warfarin (COUMADIN) 7.5 MG tablet Take 1 tablet (7.5 mg total) by mouth one time only at 6 PM. Take as directed, start with 1 and 1/2 tablets daily. 60 tablet 3  . isosorbide-hydrALAZINE (BIDIL) 20-37.5 MG per tablet Take 1 tablet by mouth 3 (three) times daily. 90 tablet 3   No current facility-administered medications for this encounter.    Filed Vitals:  11/05/14 0924  BP: 122/68  Pulse: 67  Weight: 167 lb (75.751 kg)  SpO2: 100%    PHYSICAL EXAM: General:  Well appearing. No resp difficulty. WIfe present  HEENT: normal Neck: supple. JVP flat. Carotids 2+ bilaterally; no bruits. No lymphadenopathy or thryomegaly appreciated. Cor: PMI normal. Regular rate & rhythm. No rubs, gallops or murmurs. Lifevest on.  Lungs: clear bilaterally Abdomen: soft, nontender, nondistended. No hepatosplenomegaly. No bruits or masses. Good bowel sounds. Extremities: no cyanosis, clubbing, rash, edema Neuro: alert & orientedx3, cranial nerves grossly intact. Moves all 4 extremities w/o difficulty. Affect pleasant.  ASSESSMENT &  PLAN: 1. Chronic Systolic heart Failure: Nonischemic cardiomyopathy. Left heart cath 3/16 showed no angiographic CAD. Cardiac MRI (see description above) was concerning for acute myocarditis with T2 enhancement and multiple areas of mid-wall and transmural LGE. There was also concern for blood stasis/early thrombus in apex. EF 15-20%. NYHA class I-II symptoms.  No volume overload on exam.   - Continue lasix 40 mg daily + 25 mg spironolactone daily.  - Continue Coreg 18.75 mg bid and lisinopril 20 daily.    - Add Bidil 1 tab tid.  - Continue Lifevest, repeat echo in 6/16 to look for functional recovery.  He would not be candidate for CRT (narrow QRS) but would be ICD candidate.  2. Ventricular ectopy. Continue Lifevest until repeat echo.  3. 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 INR per [facility].   4. Hyperlipidemia: Good lipids on statin.  5. Low TSH: Persistently low TSH, free T3 and T4 were normal.  ?Subclinical hyperthyroidism.  I will have him evaluated by endocrinology.   Followup in 1 month, will arrange echo for after this.   Loralie Champagne 11/05/2014

## 2014-11-05 NOTE — Patient Instructions (Addendum)
START Bidil, one tab three times per day  Labs today  You have been referred to [facility] [place]  Your physician recommends that you schedule a follow-up appointment in: 1 month  Do the following things EVERYDAY: 1) Weigh yourself in the morning before breakfast. Write it down and keep it in a log. 2) Take your medicines as prescribed 3) Eat low salt foods-Limit salt (sodium) to 2000 mg per day.  4) Stay as active as you can everyday 5) Limit all fluids for the day to less than 2 liters 6)

## 2014-11-19 ENCOUNTER — Ambulatory Visit (INDEPENDENT_AMBULATORY_CARE_PROVIDER_SITE_OTHER): Payer: Medicare Other | Admitting: *Deleted

## 2014-11-19 DIAGNOSIS — R7989 Other specified abnormal findings of blood chemistry: Secondary | ICD-10-CM | POA: Diagnosis not present

## 2014-11-19 DIAGNOSIS — Z5181 Encounter for therapeutic drug level monitoring: Secondary | ICD-10-CM

## 2014-11-19 DIAGNOSIS — R778 Other specified abnormalities of plasma proteins: Secondary | ICD-10-CM

## 2014-11-19 DIAGNOSIS — I409 Acute myocarditis, unspecified: Secondary | ICD-10-CM | POA: Diagnosis not present

## 2014-11-19 LAB — POCT INR: INR: 1.8

## 2014-11-29 ENCOUNTER — Other Ambulatory Visit (HOSPITAL_COMMUNITY): Payer: Self-pay | Admitting: Cardiology

## 2014-11-29 DIAGNOSIS — I5022 Chronic systolic (congestive) heart failure: Secondary | ICD-10-CM

## 2014-12-04 ENCOUNTER — Other Ambulatory Visit: Payer: Self-pay

## 2014-12-04 ENCOUNTER — Ambulatory Visit (INDEPENDENT_AMBULATORY_CARE_PROVIDER_SITE_OTHER): Payer: Medicare Other

## 2014-12-04 DIAGNOSIS — Z5181 Encounter for therapeutic drug level monitoring: Secondary | ICD-10-CM

## 2014-12-04 DIAGNOSIS — R778 Other specified abnormalities of plasma proteins: Secondary | ICD-10-CM

## 2014-12-04 DIAGNOSIS — I409 Acute myocarditis, unspecified: Secondary | ICD-10-CM | POA: Diagnosis not present

## 2014-12-04 DIAGNOSIS — R7989 Other specified abnormal findings of blood chemistry: Secondary | ICD-10-CM | POA: Diagnosis not present

## 2014-12-04 DIAGNOSIS — I5021 Acute systolic (congestive) heart failure: Secondary | ICD-10-CM

## 2014-12-04 LAB — POCT INR: INR: 3.9

## 2014-12-04 NOTE — Telephone Encounter (Signed)
Pt seen in Coumadin clinic, requesting refill on Lipitor 40mg  tablets sent to [facility] on [address].  Thanks

## 2014-12-06 ENCOUNTER — Encounter: Payer: Self-pay | Admitting: Internal Medicine

## 2014-12-06 ENCOUNTER — Ambulatory Visit (HOSPITAL_BASED_OUTPATIENT_CLINIC_OR_DEPARTMENT_OTHER)
Admission: RE | Admit: 2014-12-06 | Discharge: 2014-12-06 | Disposition: A | Discharge status: Home / Self Care | Payer: Medicare Other | Source: Ambulatory Visit | Attending: Cardiology | Admitting: Cardiology

## 2014-12-06 ENCOUNTER — Other Ambulatory Visit: Payer: Self-pay

## 2014-12-06 ENCOUNTER — Ambulatory Visit (HOSPITAL_COMMUNITY)
Admission: RE | Admit: 2014-12-06 | Discharge: 2014-12-06 | Disposition: A | Discharge status: Home / Self Care | Payer: Medicare Other | Source: Ambulatory Visit | Attending: Cardiology | Admitting: Cardiology

## 2014-12-06 ENCOUNTER — Encounter (HOSPITAL_COMMUNITY): Payer: Self-pay

## 2014-12-06 ENCOUNTER — Ambulatory Visit (INDEPENDENT_AMBULATORY_CARE_PROVIDER_SITE_OTHER): Payer: Medicare Other | Admitting: Internal Medicine

## 2014-12-06 VITALS — BP 112/66 | HR 58 | Wt 165.5 lb

## 2014-12-06 VITALS — BP 118/72 | HR 54 | Temp 97.8°F | Resp 12 | Ht 67.5 in | Wt 165.0 lb

## 2014-12-06 DIAGNOSIS — E0520 Thyrotoxicosis with toxic multinodular goiter without thyrotoxic crisis or storm: Secondary | ICD-10-CM | POA: Diagnosis not present

## 2014-12-06 DIAGNOSIS — I5021 Acute systolic (congestive) heart failure: Secondary | ICD-10-CM | POA: Diagnosis not present

## 2014-12-06 DIAGNOSIS — I5022 Chronic systolic (congestive) heart failure: Secondary | ICD-10-CM | POA: Diagnosis not present

## 2014-12-06 DIAGNOSIS — I429 Cardiomyopathy, unspecified: Secondary | ICD-10-CM | POA: Insufficient documentation

## 2014-12-06 DIAGNOSIS — E785 Hyperlipidemia, unspecified: Secondary | ICD-10-CM | POA: Insufficient documentation

## 2014-12-06 DIAGNOSIS — E0590 Thyrotoxicosis, unspecified without thyrotoxic crisis or storm: Secondary | ICD-10-CM | POA: Insufficient documentation

## 2014-12-06 DIAGNOSIS — I420 Dilated cardiomyopathy: Secondary | ICD-10-CM

## 2014-12-06 LAB — BRAIN NATRIURETIC PEPTIDE: B NATRIURETIC PEPTIDE 5: 134.7 pg/mL — AB (ref 0.0–100.0)

## 2014-12-06 LAB — BASIC METABOLIC PANEL
Anion gap: 5 (ref 5–15)
BUN: 21 mg/dL — AB (ref 6–20)
CO2: 27 mmol/L (ref 22–32)
Calcium: 9.2 mg/dL (ref 8.9–10.3)
Chloride: 107 mmol/L (ref 101–111)
Creatinine, Ser: 1.36 mg/dL — ABNORMAL HIGH (ref 0.61–1.24)
GFR calc Af Amer: 59 mL/min — ABNORMAL LOW (ref >60)
GFR calc non Af Amer: 51 mL/min — ABNORMAL LOW (ref >60)
GLUCOSE: 96 mg/dL (ref 65–99)
Potassium: 5.2 mmol/L — ABNORMAL HIGH (ref 3.5–5.1)
Sodium: 139 mmol/L (ref 135–145)

## 2014-12-06 LAB — TSH: TSH: 0.15 u[IU]/mL — AB (ref 0.35–4.50)

## 2014-12-06 LAB — T4, FREE: FREE T4: 1.16 ng/dL (ref 0.60–1.60)

## 2014-12-06 LAB — T3, FREE: T3, Free: 2.8 pg/mL (ref 2.3–4.2)

## 2014-12-06 MED ORDER — SACUBITRIL-VALSARTAN 24-26 MG PO TABS: 1.0000 | ORAL_TABLET | Freq: Two times a day (BID) | ORAL | Status: DC

## 2014-12-06 NOTE — Patient Instructions (Signed)
STOP Lisinopril.  START Entresto 24/26mg  tablet twice daily.  We will refer you to an electrophysiologist at [facility] to discuss ICD. [place] [facility] [place]. [address], [place], [place] [address] Phone: [phone]  Return in 2 weeks for lab work.  Follow up in 2 months for routine appointment.  Do the following things EVERYDAY: 1) Weigh yourself in the morning before breakfast. Write it down and keep it in a log. 2) Take your medicines as prescribed 3) Eat low salt foods-Limit salt (sodium) to 2000 mg per day.  4) Stay as active as you can everyday 5) Limit all fluids for the day to less than 2 liters

## 2014-12-06 NOTE — Progress Notes (Signed)
Patient ID: Bryan Lamb, male   DOB: 1945-07-14, 69 y.o.   MRN: [id number] PCP: None  Primary Cardiologist: Dr Aundra Dubin INR: [facility].   HPI: Bryan Lamb is a 69 yo smoker with history of myocarditis, systolic heart faiure, PVCs, apical blood stasis on coumadin, and former smoker (quit in February 2016).   Admitted to [facility] in 2/16 with dyspnea and found to have probable acute myocarditis. Had LHC and CMRI as noted below. Due to frequent PVCs and NSVT, he was discharged with a Lifevest. Started CHF meds. Discharge weight was 162 pounds.   He returns for followup.  He continues to do quite well overall.  Dyspnea only with heavy exertion. He can mow the grass.  He can climb a flight of steps without problems.  No chest pain.  No lightheadedness or syncope. No palpitations.  No Lifevest discharge.    LHC (09/04/2014): No angiographic CAD. Cardiac MRI (09/05/2014 ): EF 15-20% diffuse hypokinesis, moderate RV systolic dysfunction, concern for early thrombus LV apex, mid inferior and peri-apical transmural LGE with mid-wall RV LGE => concern for acute myocarditis.  ECHO 08/08/2014 EF 20% LV moderately dilated.  ECG (3/16): NSR, LVH with marked anterolateral T wave inversions Echo (6/16): Moderately dilated LV with EF 25%, normal RV size with mildly decreased systolic function.   Labs (3/16): K 3.9, creatinine 1.3, BNP 162, HIV negative, ferritin 133, LDL 112, TSH 0.06, SPEP with monoclonal protein Labs (4/16): LFTs normal, free T3 normal, free T4 normal, TSH 0.187 (low), K 3.9, creatinine 1.03, LDL 60 Labs (5/16): K 4.5, creatinine 1.07, BNP 230  ROS: All systems negative except as listed in HPI, PMH and Problem List.  SH:  History   Social History  . Marital Status: Married    Spouse Name: N/A  . Number of Children: N/A  . Years of Education: N/A   Occupational History  . Not on file.   Social History Main Topics  . Smoking status: Light Tobacco Smoker  . Smokeless tobacco: Not on file  . Alcohol  Use: No  . Drug Use: No  . Sexual Activity: Not on file   Other Topics Concern  . Not on file   Social History Narrative    FH: No cardiomyopathy or premature CAD.   Past Medical History  Diagnosis Date  . Urinary incontinence     Current Outpatient Prescriptions  Medication Sig Dispense Refill  . atorvastatin (LIPITOR) 40 MG tablet Take 1 tablet (40 mg total) by mouth daily at 6 PM. 90 tablet 3  . carvedilol (COREG) 12.5 MG tablet Take 1.5 tablets (18.75 mg total) by mouth 2 (two) times daily with a meal. 90 tablet 3  . finasteride (PROSCAR) 5 MG tablet Take 5 mg by mouth daily.  0  . furosemide (LASIX) 40 MG tablet Take 1 tablet (40 mg total) by mouth daily. 30 tablet 11  . isosorbide-hydrALAZINE (BIDIL) 20-37.5 MG per tablet Take 1 tablet by mouth 3 (three) times daily. 90 tablet 3  . nitroGLYCERIN (NITROSTAT) 0.4 MG SL tablet Place 1 tablet (0.4 mg total) under the tongue every 5 (five) minutes x 3 doses as needed for chest pain. 25 tablet 12  . spironolactone (ALDACTONE) 25 MG tablet Take 1 tablet (25 mg total) by mouth daily. 30 tablet 11  . warfarin (COUMADIN) 7.5 MG tablet Take 1 tablet (7.5 mg total) by mouth one time only at 6 PM. Take as directed, start with 1 and 1/2 tablets daily. 60 tablet 3  .  atorvastatin (LIPITOR) 80 MG tablet   1  . lisinopril (PRINIVIL,ZESTRIL) 20 MG tablet   1  . sacubitril-valsartan (ENTRESTO) 24-26 MG Take 1 tablet by mouth 2 (two) times daily. 60 tablet 3   No current facility-administered medications for this encounter.    Filed Vitals:   12/06/14 0855  BP: 112/66  Pulse: 58  Weight: 165 lb 8 oz (75.07 kg)  SpO2: 100%    PHYSICAL EXAM: General:  Well appearing. No resp difficulty. WIfe present  HEENT: normal Neck: supple. JVP flat. Carotids 2+ bilaterally; no bruits. No lymphadenopathy or thryomegaly appreciated. Cor: PMI normal. Regular rate & rhythm. No rubs, gallops or murmurs. Lifevest on.  Lungs: clear bilaterally Abdomen:  soft, nontender, nondistended. No hepatosplenomegaly. No bruits or masses. Good bowel sounds. Extremities: no cyanosis, clubbing, rash, edema Neuro: alert & orientedx3, cranial nerves grossly intact. Moves all 4 extremities w/o difficulty. Affect pleasant.  ASSESSMENT & PLAN: 1. Chronic Systolic heart Failure: Nonischemic cardiomyopathy. Left heart cath 3/16 showed no angiographic CAD. Cardiac MRI (see description above) was concerning for acute myocarditis with T2 enhancement and multiple areas of mid-wall and transmural LGE. There was also concern for blood stasis/early thrombus in apex so warfarin started. EF 15-20% initially. NYHA class II symptoms.  No volume overload on exam today.  I reviewed today's echo.  EF remains 25% with moderate LV dilation.  ECG and echo appearance not classic for amyloidosis, and there was late gadolinium enhancement on MRI but more in a myocarditis-type pattern.  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  - With abnormal SPEP, I am going to send serum immunofixation and UPEP.  If either of these are abnormal, will need hematology referral and fat pad biopsy for ?amyloidosis.  - Continue lasix 40 mg daily + 25 mg spironolactone daily.  - Continue Coreg 18.75 mg bid and Bidil 1 tab tid.    - Stop lisinopril and 36 hours later will start Entresto 24/26 bid.  BMET 2 wks.  - EF remains low on echo; he has had on Lifevest for frequent runs NSVT when in hospital.  Will refer to EP for ICD.  He would not be candidate for CRT (narrow QRS) but would be ICD candidate.  2. Ventricular ectopy: Has on Lifevest.  To EP for ICD.   3. Apical blood stasis/possible early apical thrombus: Not seen on today's echo. On coumadin. INR per [facility].   4. Hyperlipidemia: Good lipids on statin.  5. Low TSH: Subclinical hyperthyroidism.  Seeing endocrinology.   Followup in 2 months.    Bryan Lamb 12/06/2014

## 2014-12-06 NOTE — Progress Notes (Addendum)
Patient ID: Bryan Lamb, male   DOB: [birth year], 69 y.o.   MRN: [id number]   HPI  Bryan Lamb is a 69 y.o.-year-old male, referred by his cardiologist, Dr. Aundra Dubin, for management of subclinical thyrotoxicosis. He is here with his wife who offers most of the hx.   Pt has a AMI in 09/03/2014. 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 He had a 2D Echo on 12/06/2014:  - Moderately dilated LV with EF 25% (prev. 20% in 09/2014), diffuse hypokinesis. No LVthrombus. Normal RV size with mildly decreased systolic function. No significant valvular abnormalities.  His low TSH was discovered during investigation for heart ds.  I reviewed pt's thyroid tests: Lab Results  Component Value Date   TSH 0.187* 10/08/2014   TSH 0.060* 09/04/2014   FREET4 1.05 10/08/2014    Pt denies feeling nodules in neck, hoarseness, dysphagia/odynophagia, SOB with lying down; he c/o: - no fatigue - no excessive sweating/heat intolerance - no tremors - no anxiety - no palpitations - no hyperdefecation and constipation - no weight loss/gain - no hair loss  Pt does have a FH of thyroid ds.: sister. No FH of thyroid cancer. No h/o radiation tx to head or neck.  No seaweed or kelp, no recent contrast studies. No steroid use. No herbal supplements. No Biotin use.  No H/o URI early this year.  ROS: Constitutional: no weight gain/loss, no fatigue, no subjective hyperthermia/hypothermia Eyes: no blurry vision, no xerophthalmia ENT: no sore throat, no nodules palpated in throat, no dysphagia/odynophagia, no hoarseness Cardiovascular: no CP/SOB/palpitations/leg swelling Respiratory: no cough/SOB Gastrointestinal: no N/V/D/C Musculoskeletal: no muscle/joint aches Skin: no rashes Neurological: no tremors/numbness/tingling/dizziness Psychiatric: no depression/anxiety  Past Medical History  Diagnosis Date  . Urinary incontinence    Past Surgical History  Procedure Laterality Date  . Knee surgery     . Ankle fracture surgery    . Left heart catheterization with coronary angiogram N/A 09/04/2014    Procedure: LEFT HEART CATHETERIZATION WITH CORONARY ANGIOGRAM;  Surgeon: Sanda Klein, MD;  Location: [facility];  Service: Cardiovascular;  Laterality: N/A;   History   Social History  . Marital Status: Married    Spouse Name: N/A  . Number of Children: N/A   Occupational History  . N/a   Social History Main Topics  . Smoking status: Light Tobacco Smoker  . Smokeless tobacco: Not on file  . Alcohol Use: No  . Drug Use: No   Current Outpatient Prescriptions on File Prior to Visit  Medication Sig Dispense Refill  . atorvastatin (LIPITOR) 40 MG tablet Take 1 tablet (40 mg total) by mouth daily at 6 PM. 90 tablet 3  . carvedilol (COREG) 12.5 MG tablet Take 1.5 tablets (18.75 mg total) by mouth 2 (two) times daily with a meal. 90 tablet 3  . finasteride (PROSCAR) 5 MG tablet Take 5 mg by mouth daily.  0  . furosemide (LASIX) 40 MG tablet Take 1 tablet (40 mg total) by mouth daily. 30 tablet 11  . isosorbide-hydrALAZINE (BIDIL) 20-37.5 MG per tablet Take 1 tablet by mouth 3 (three) times daily. 90 tablet 3  . nitroGLYCERIN (NITROSTAT) 0.4 MG SL tablet Place 1 tablet (0.4 mg total) under the tongue every 5 (five) minutes x 3 doses as needed for chest pain. 25 tablet 12  . sacubitril-valsartan (ENTRESTO) 24-26 MG Take 1 tablet by mouth 2 (two) times daily. 60 tablet 3  . spironolactone (ALDACTONE) 25 MG tablet Take 1 tablet (25 mg total) by  mouth daily. 30 tablet 11  . warfarin (COUMADIN) 7.5 MG tablet Take 1 tablet (7.5 mg total) by mouth one time only at 6 PM. Take as directed, start with 1 and 1/2 tablets daily. 60 tablet 3   No current facility-administered medications on file prior to visit.   No Known Allergies   FH: - see HPI  PE: BP 118/72 mmHg  Pulse 54  Temp(Src) 97.8 F (36.6 C) (Oral)  Resp 12  Ht 5' 7.5" (1.715 m)  Wt 165 lb (74.844 kg)  BMI 25.45 kg/m2  SpO2  96% Wt Readings from Last 3 Encounters:  12/06/14 165 lb (74.844 kg)  09/13/14 162 lb (73.483 kg)  09/05/14 162 lb 11.2 oz (73.8 kg)   Constitutional: normal weight, in NAD Eyes: PERRLA, EOMI, slight B exophthalmos, no lid lag, no stare ENT: moist mucous membranes, + B symmetric thyromegaly, no thyroid bruits, no cervical lymphadenopathy Cardiovascular: bradycardia, RR, No MRG Respiratory: CTA B Gastrointestinal: abdomen soft, NT, ND, BS+ Musculoskeletal: no deformities, strength intact in all 4 Skin: moist, warm, no rashes Neurological: no tremor with outstretched hands, DTR normal in all 4  ASSESSMENT: 1. Subclinical Thyrotoxicosis  PLAN:  1. Patient with a recently found low TSH x2, with normal free T4 and free T3 and without thyrotoxic sxs: weight loss, heat intolerance, hyperdefecation, palpitations, anxiety.  - he does not appear to have exogenous causes for the low TSH.  - We discussed that possible causes of thyrotoxicosis are:  Thyroiditis  Graves ds toxic multinodular goiter/ toxic adenoma (I cannot feel nodules at palpation of his thyroid). - I suggested that we check the TSH, fT3 and fT4 and also add thyroid stimulating antibodies to screen for Graves' disease.  - If the tests remain abnormal, we may need an uptake and scan to differentiate between the 3 above possible etiologies. I explained how the test is done. - we discussed about possible modalities of treatment for the above conditions, to include methimazole use, radioactive iodine ablation or (last resort) surgery. - we might need to do thyroid ultrasound depending on the results of the uptake and scan (if a cold nodule is present) - he is on beta blocker: carvedilol (per cardiology) - I advised him to join my chart to communicate easier - refuses - RTC in 4 months, but possibly sooner for repeat labs  Component     Latest Ref Rng 12/06/2014  TSH     0.35 - 4.50 uIU/mL 0.15 (L)  T3, Free     2.3 - 4.2 pg/mL  2.8  Free T4     0.60 - 1.60 ng/dL 1.16  TSI     <140 % baseline 36   TSH a little lower than before. At this point, I would like to check a thyroid Uptake and scan, however, he is asymptomatic from his mild subclinical hyperthyroidism, so we may not necessarily treat with RAI Tx if the Uptake not high. He would preferably become euthyroid, though, from his CHF point of view. A low dose MMI may be an option.  Thyroid uptake and scan (01/10/2015):  CLINICAL DATA: Subclinical hyperthyroidism  EXAM: THYROID SCAN AND UPTAKE - 24 HOURS  TECHNIQUE: Following the per oral administration of I-131 sodium iodide, the patient returned at 24 hours and uptake measurements were acquired with the uptake probe centered on the neck. Thyroid imaging was performed following the intravenous administration of the Tc-85m Pertechnetate.  RADIOPHARMACEUTICALS: 14.0 microCuries I-131 sodium iodide orally and 10.5 mCi Technetium-75m pertechnetate IV  COMPARISON: None  FINDINGS: 24 hour radio iodine uptake calculated at 9%, slightly below the normal range.  Imaging of the thyroid gland demonstrates poor tracer localization within the thyroid lobes, with a few small areas of tracer localization seen with additional areas of decreased tracer localization.  Findings suggest presence of an enlarged multinodular thyroid gland though localization is insufficient for adequate characterization.  Potentially the thyroid gland could extend into the superior mediastinum.  Subjectively, I would have estimated the 24 hour uptake to be less than 9% based on the poor localization of pertechnetate on imaging.  IMPRESSION: Slightly decreased 24 hour radio iodine uptake of 9%.  Poor tracer localization within the thyroid gland, which appears enlarged and multinodular and could potentially be extending into the superior mediastinum.   Electronically Signed By: Lavonia Dana M.D. On: 01/10/2015  14:49   Patient's thyroid iodine uptake is not elevated. He has very mild toxic multinodular goiter. Since he does not have possible hyperthyroid symptoms, I would like to just follow his thyroid labs for now.  His goiter could extend into the superior mediastinum. He has no compression symptoms, which is great, however, I would like to check a thyroid ultrasound to better characterize his goiter. I will discuss with the patient.. I will addend the results when they become available.

## 2014-12-06 NOTE — Progress Notes (Signed)
  Echocardiogram 2D Echocardiogram has been performed.  Bryan Lamb FRANCES 12/06/2014, 8:52 AM

## 2014-12-06 NOTE — Patient Instructions (Signed)
Please stop at the lab.  Please come back for a follow-up appointment in 4 months.   

## 2014-12-07 ENCOUNTER — Encounter: Payer: Self-pay | Admitting: Internal Medicine

## 2014-12-07 ENCOUNTER — Ambulatory Visit (INDEPENDENT_AMBULATORY_CARE_PROVIDER_SITE_OTHER): Payer: Medicare Other | Admitting: Internal Medicine

## 2014-12-07 VITALS — BP 80/42 | HR 73 | Ht 67.0 in | Wt 165.8 lb

## 2014-12-07 DIAGNOSIS — I429 Cardiomyopathy, unspecified: Secondary | ICD-10-CM | POA: Diagnosis not present

## 2014-12-07 DIAGNOSIS — Z720 Tobacco use: Secondary | ICD-10-CM

## 2014-12-07 DIAGNOSIS — I420 Dilated cardiomyopathy: Secondary | ICD-10-CM

## 2014-12-07 LAB — UIFE/LIGHT CHAINS/TP QN, 24-HR UR
% BETA, URINE: 48.6 %
ALPHA 1 URINE: 3 %
Albumin, U: 24.6 %
Alpha 2, Urine: 17.9 %
FREE KAPPA/LAMBDA RATIO: 6.98 (ref 2.04–10.37)
FREE LAMBDA LT CHAINS, UR: 5.3 mg/L (ref 0.24–6.66)
Free Lt Chn Excr Rate: 37 mg/L — ABNORMAL HIGH (ref 1.35–24.19)
GAMMA GLOBULIN URINE: 5.9 %
Total Protein, Urine: 10.6 mg/dL

## 2014-12-07 NOTE — Progress Notes (Signed)
Electrophysiology Office Note   Date:  12/07/2014   ID:  Bryan Lamb, DOB [birth year], MRN [id number]  PCP:  none Cardiologist:  Dr Aundra Dubin Primary Electrophysiologist: Thompson Grayer, MD    Chief Complaint  Patient presents with  . Nonischemic dilated cardiomyopathy     History of Present Illness: Bryan Lamb is a 69 y.o. male who presents today for electrophysiology evaluation.   The patient presents today for risk stratification of sudden death.  He presented to [facility] 2/16 with symptoms of CHF.  He was felt to possibly have acute myocarditis.  He had reduced EF and frequent NSVT.  He was initiated on medical therapy and has been followed by Dr Aundra Dubin. He reports that about a month ago, he had brief loss of consciousness.  He was wearing his lifevest which did not record an arrhythmia.  His symptoms were attributed to hypotension and newly started bidil.  He has been treated with optimal medicines but continues to have a reduced EF.  He has returned to reasonable activity.  He can mow the grass with a self propelled lawn mower.  He can climb a flight of steps without problems.  He reports SOB with walking 1/2 mile.  No chest pain.  No lightheadedness or syncope. No palpitations.  No Lifevest discharge.    LHC (09/04/2014): No angiographic CAD.  Cardiac MRI (09/05/2014 ): EF 15-20% diffuse hypokinesis, moderate RV systolic dysfunction, concern for early thrombus LV apex, mid inferior and peri-apical transmural LGE with mid-wall RV LGE => concern for acute myocarditis.   ECHO 08/08/2014 EF 20% LV moderately dilated.  ECG (3/16): NSR, LVH with marked anterolateral T wave inversions  Echo (6/16): Moderately dilated LV with EF 25%, normal RV size with mildly decreased systolic function.   Today, he denies symptoms of palpitations, chest pain, shortness of breath, orthopnea, PND, lower extremity edema, claudication,  presyncope, syncope, bleeding, or neurologic sequela.  He has  hypotension today and mild dizziness. The patient is tolerating medications without difficulties and is otherwise without complaint today.    Past Medical History  Diagnosis Date  . Urinary incontinence   . Nonischemic cardiomyopathy   . Chronic systolic dysfunction of left ventricle   . NSVT (nonsustained ventricular tachycardia)    Past Surgical History  Procedure Laterality Date  . Knee surgery    . Ankle fracture surgery    . Left heart catheterization with coronary angiogram N/A 09/04/2014    Procedure: LEFT HEART CATHETERIZATION WITH CORONARY ANGIOGRAM;  Surgeon: Sanda Klein, MD;  Location: [place] CATH LAB;  Service: Cardiovascular;  Laterality: N/A;     Current Outpatient Prescriptions  Medication Sig Dispense Refill  . atorvastatin (LIPITOR) 40 MG tablet Take 1 tablet (40 mg total) by mouth daily at 6 PM. 90 tablet 3  . carvedilol (COREG) 12.5 MG tablet Take 1.5 tablets (18.75 mg total) by mouth 2 (two) times daily with a meal. 90 tablet 3  . finasteride (PROSCAR) 5 MG tablet Take 5 mg by mouth daily.  0  . furosemide (LASIX) 40 MG tablet Take 1 tablet (40 mg total) by mouth daily. 30 tablet 11  . isosorbide-hydrALAZINE (BIDIL) 20-37.5 MG per tablet Take 1 tablet by mouth 3 (three) times daily. 90 tablet 3  . nitroGLYCERIN (NITROSTAT) 0.4 MG SL tablet Place 1 tablet (0.4 mg total) under the tongue every 5 (five) minutes x 3 doses as needed for chest pain. 25 tablet 12  . sacubitril-valsartan (ENTRESTO) 24-26 MG Take 1 tablet by  mouth 2 (two) times daily. 60 tablet 3  . spironolactone (ALDACTONE) 25 MG tablet Take 1 tablet (25 mg total) by mouth daily. 30 tablet 11  . warfarin (COUMADIN) 7.5 MG tablet Take 1 tablet (7.5 mg total) by mouth one time only at 6 PM. Take as directed, start with 1 and 1/2 tablets daily. 60 tablet 3   No current facility-administered medications for this visit.    Allergies:   Review of patient's allergies indicates no known allergies.   Social  History:  The patient  reports that he has been smoking.  He does not have any smokeless tobacco history on file. He reports that he does not drink alcohol or use illicit drugs.   Family History:  The patient's  family history includes Heart disease in an other family member; Stroke in an other family member.    ROS:  Please see the history of present illness.   All other systems are reviewed and negative.    PHYSICAL EXAM: VS:  BP 80/42 mmHg  Pulse 73  Ht 5\' 7"  (1.702 m)  Wt 75.206 kg (165 lb 12.8 oz)  BMI 25.96 kg/m2 , BMI Body mass index is 25.96 kg/(m^2). GEN: Well nourished, well developed, in no acute distress HEENT: normal Neck: no JVD, carotid bruits, or masses Cardiac: RRR; no murmurs, rubs, or gallops,no edema  Respiratory:  clear to auscultation bilaterally, normal work of breathing GI: soft, nontender, nondistended, + BS MS: no deformity or atrophy Skin: warm and dry  Neuro:  Strength and sensation are intact Psych: euthymic mood, full affect  EKG:  EKG 09/05/14 revealed sinus rhythm 80 bpm with short PR but no obvious pre-excitation, LVH with repolarization changes   Recent Labs: 09/04/2014: Magnesium 1.8 09/07/2014: Hemoglobin 13.7; Platelets 182 10/08/2014: ALT 26 12/06/2014: B Natriuretic Peptide 134.7*; BUN 21*; Creatinine 1.36*; Potassium 5.2*; Sodium 139; TSH 0.15*    Lipid Panel     Component Value Date/Time   CHOL 110 10/08/2014 1020   TRIG 40 10/08/2014 1020   HDL 42 10/08/2014 1020   CHOLHDL 2.6 10/08/2014 1020   VLDL 8 10/08/2014 1020   LDLCALC 60 10/08/2014 1020     Wt Readings from Last 3 Encounters:  12/07/14 75.206 kg (165 lb 12.8 oz)  12/06/14 74.844 kg (165 lb)  09/13/14 73.483 kg (162 lb)      Other studies Reviewed: Additional studies/ records that were reviewed today include: Dr Oleh Genin notes  Review of the above records today demonstrates: ekgs and echos as above   ASSESSMENT AND PLAN:  1.  Nonischemic CM/ NSVT/chronic systolic  dysfunction The patient has a nonischemic CM (EF 2-25%), NYHA Class II/III CHF, and NSVT. At this time, he meets MSCD-HeFT criteria for ICD implantation for primary prevention of sudden death.  Risks, benefits, alternatives to ICD implantation were discussed in detail with the patient today. He does not meet criteria for CRT. At this time, the patient is clear that he is not interested in ICD implantation.  He understands risks of sudden death but states "I dont think that I want one right now". I have therefore given him my card and instructed him to contact my office should he change his mind.  In addition, I have informed him that a wearable defibrillator is not a long term solution and have encouraged him to return his wearable defibrillator to the [employer].  Device interrogation is reviewed today and does not reveal arrhythmias.  2. Tobacco Cessation is encouraged He is not  ready to quit  Current medicines are reviewed at length with the patient today.   The patient does not have concerns regarding his medicines.  The following changes were made today:  none   Follow-up with Dr Aundra Dubin a scheduled I will see as needed going forward.   SignedThompson Grayer, MD  12/07/2014 2:51 PM     [place] [address] [phone] (office) [phone] (fax)

## 2014-12-07 NOTE — Patient Instructions (Signed)
Medication Instructions: - no change  Labwork: - none  Procedures/Testing: - Please call the office you decide to have a defibrillator placed- Janan Halter, RN for Dr. Rayann Heman. [phone]  Follow-Up: - pending  Any Additional Special Instructions Will Be Listed Below (If Applicable). - you may send your LifeVest back to the company.

## 2014-12-10 LAB — IMMUNOFIXATION ELECTROPHORESIS
IgA: 166 mg/dL (ref 61–437)
IgG (Immunoglobin G), Serum: 1102 mg/dL (ref 700–1600)
IgM, Serum: 44 mg/dL (ref 20–172)
Total Protein ELP: 7.3 g/dL (ref 6.0–8.5)

## 2014-12-11 LAB — THYROID STIMULATING IMMUNOGLOBULIN: TSI: 36 % baseline (ref <140)

## 2014-12-12 ENCOUNTER — Telehealth (HOSPITAL_COMMUNITY): Payer: Self-pay | Admitting: Vascular Surgery

## 2014-12-12 ENCOUNTER — Telehealth (HOSPITAL_COMMUNITY): Payer: Self-pay | Admitting: *Deleted

## 2014-12-12 DIAGNOSIS — E859 Amyloidosis, unspecified: Secondary | ICD-10-CM

## 2014-12-12 NOTE — Telephone Encounter (Signed)
Dr Haroldine Laws called and discussed results with pt on 12/11/14, biopsy sch for Tue 6/14 at 2 pm, pt needs to arrive at 12:30, NPO after 7 am, hold coumadin 4 days prior, will need driver.  Spoke w/pt and wife they are aware of all instructions.  Referral placed to hematology they will call her with appt.

## 2014-12-12 NOTE — Telephone Encounter (Signed)
-----   Message from Larey Dresser, MD sent at 12/10/2014  9:46 PM EDT ----- Monoclonal protein on immunofixation.  I am concerned that he may have cardiac amyloidosis.  He needs an abdominal fat pad biopsy to look for this.  This can be arranged through IR, he may need to stop coumadin for 3-5 days prior (would ask IR how long they usually keep people off coumadin for biopsy).  He also needs an appointment with hematology.  I would be glad to see him back in the office if he wants to discuss this prior to having biopsy. Please call.

## 2014-12-12 NOTE — Telephone Encounter (Signed)
Left message with Tiffany @ [facility] to call me back to get pt scheduled.Marland Kitchenkas

## 2014-12-17 ENCOUNTER — Other Ambulatory Visit: Payer: Self-pay | Admitting: Physician Assistant

## 2014-12-17 ENCOUNTER — Other Ambulatory Visit: Payer: Self-pay | Admitting: Radiology

## 2014-12-17 ENCOUNTER — Ambulatory Visit (INDEPENDENT_AMBULATORY_CARE_PROVIDER_SITE_OTHER): Payer: Medicare Other | Admitting: *Deleted

## 2014-12-17 DIAGNOSIS — Z5181 Encounter for therapeutic drug level monitoring: Secondary | ICD-10-CM | POA: Diagnosis not present

## 2014-12-17 DIAGNOSIS — I409 Acute myocarditis, unspecified: Secondary | ICD-10-CM | POA: Diagnosis not present

## 2014-12-17 DIAGNOSIS — R7989 Other specified abnormal findings of blood chemistry: Secondary | ICD-10-CM

## 2014-12-17 DIAGNOSIS — R778 Other specified abnormalities of plasma proteins: Secondary | ICD-10-CM

## 2014-12-17 LAB — POCT INR: INR: 1.6

## 2014-12-18 ENCOUNTER — Telehealth: Payer: Self-pay | Admitting: Internal Medicine

## 2014-12-18 ENCOUNTER — Ambulatory Visit (HOSPITAL_COMMUNITY)
Admission: RE | Admit: 2014-12-18 | Discharge: 2014-12-18 | Disposition: A | Discharge status: Home / Self Care | Payer: Medicare Other | Source: Ambulatory Visit | Attending: Cardiology | Admitting: Cardiology

## 2014-12-18 ENCOUNTER — Encounter (HOSPITAL_COMMUNITY): Payer: Self-pay

## 2014-12-18 DIAGNOSIS — Z79899 Other long term (current) drug therapy: Secondary | ICD-10-CM | POA: Diagnosis not present

## 2014-12-18 DIAGNOSIS — I509 Heart failure, unspecified: Secondary | ICD-10-CM | POA: Insufficient documentation

## 2014-12-18 DIAGNOSIS — E859 Amyloidosis, unspecified: Secondary | ICD-10-CM | POA: Insufficient documentation

## 2014-12-18 DIAGNOSIS — I255 Ischemic cardiomyopathy: Secondary | ICD-10-CM | POA: Diagnosis not present

## 2014-12-18 DIAGNOSIS — I428 Other cardiomyopathies: Secondary | ICD-10-CM | POA: Diagnosis not present

## 2014-12-18 DIAGNOSIS — Z7901 Long term (current) use of anticoagulants: Secondary | ICD-10-CM | POA: Diagnosis not present

## 2014-12-18 DIAGNOSIS — R1900 Intra-abdominal and pelvic swelling, mass and lump, unspecified site: Secondary | ICD-10-CM | POA: Diagnosis not present

## 2014-12-18 DIAGNOSIS — E65 Localized adiposity: Secondary | ICD-10-CM | POA: Diagnosis not present

## 2014-12-18 DIAGNOSIS — F17210 Nicotine dependence, cigarettes, uncomplicated: Secondary | ICD-10-CM | POA: Insufficient documentation

## 2014-12-18 LAB — CBC
HCT: 31.4 % — ABNORMAL LOW (ref 39.0–52.0)
HEMOGLOBIN: 10.5 g/dL — AB (ref 13.0–17.0)
MCH: 28.2 pg (ref 26.0–34.0)
MCHC: 33.4 g/dL (ref 30.0–36.0)
MCV: 84.4 fL (ref 78.0–100.0)
Platelets: 185 10*3/uL (ref 150–400)
RBC: 3.72 MIL/uL — AB (ref 4.22–5.81)
RDW: 16.1 % — ABNORMAL HIGH (ref 11.5–15.5)
WBC: 3.7 10*3/uL — ABNORMAL LOW (ref 4.0–10.5)

## 2014-12-18 LAB — PROTIME-INR
INR: 1.29 (ref 0.00–1.49)
PROTHROMBIN TIME: 16.3 s — AB (ref 11.6–15.2)

## 2014-12-18 LAB — APTT: APTT: 32 s (ref 24–37)

## 2014-12-18 MED ORDER — SODIUM CHLORIDE 0.9 % IV SOLN
INTRAVENOUS | Status: DC
  Administered 2014-12-18: 13:00:00 via INTRAVENOUS

## 2014-12-18 MED ORDER — FENTANYL CITRATE (PF) 100 MCG/2ML IJ SOLN
INTRAMUSCULAR | Status: AC | PRN
  Administered 2014-12-18: 25 ug via INTRAVENOUS

## 2014-12-18 MED ORDER — MIDAZOLAM HCL 2 MG/2ML IJ SOLN
INTRAMUSCULAR | Status: AC
  Filled 2014-12-18: qty 2

## 2014-12-18 MED ORDER — MIDAZOLAM HCL 2 MG/2ML IJ SOLN
INTRAMUSCULAR | Status: AC | PRN
  Administered 2014-12-18: 1 mg via INTRAVENOUS

## 2014-12-18 MED ORDER — LIDOCAINE HCL (PF) 1 % IJ SOLN
INTRAMUSCULAR | Status: AC
  Filled 2014-12-18: qty 10

## 2014-12-18 MED ORDER — FENTANYL CITRATE (PF) 100 MCG/2ML IJ SOLN
INTRAMUSCULAR | Status: AC
  Filled 2014-12-18: qty 2

## 2014-12-18 NOTE — Sedation Documentation (Signed)
Patient denies pain and is resting comfortably.  

## 2014-12-18 NOTE — Discharge Instructions (Signed)
Needle Biopsy °Care After °These instructions give you information on caring for yourself after your procedure. Your doctor may also give you more specific instructions. Call your doctor if you have any problems or questions after your procedure. °HOME CARE °· Rest for 4 hours after your biopsy, except for getting up to go to the bathroom or as told. °· Keep the places where the needles were put in clean and dry. °¨ Do not put powder or lotion on the sites. °¨ Do not shower until 24 hours after the test. Remove all bandages (dressings) before showering. °¨ Remove all bandages at least once every day. Gently clean the sites with soap and water. Keep putting a new bandage on until the skin is closed. °Finding out the results of your test °Ask your doctor when your test results will be ready. Make sure you follow up and get the test results. °GET HELP RIGHT AWAY IF:  °· You have shortness of breath or trouble breathing. °· You have pain or cramping in your belly (abdomen). °· You feel sick to your stomach (nauseous) or throw up (vomit). °· Any of the places where the needles were put in: °¨ Are puffy (swollen) or red. °¨ Are sore or hot to the touch. °¨ Are draining yellowish-white fluid (pus). °¨ Are bleeding after 10 minutes of pressing down on the site. Have someone keep pressing on any place that is bleeding until you see a doctor. °· You have any unusual pain that will not stop. °· You have a fever. °If you go to the emergency room, tell the nurse that you had a biopsy. Take this paper with you to show the nurse. °MAKE SURE YOU:  °· Understand these instructions. °· Will watch your condition. °· Will get help right away if you are not doing well or get worse. °Document Released: 06/04/2008 Document Revised: 09/14/2011 Document Reviewed: 06/04/2008 °ExitCare® Patient Information ©2015 [employer]. This information is not intended to replace advice given to you by your health care provider. Make sure you discuss  any questions you have with your health care provider. ° °

## 2014-12-18 NOTE — Telephone Encounter (Signed)
NEW PATIENT APPT -S/W PATIENT AND GAVE NP APPT FOR 07/11 @ 11 W/DR. MOHAMED REFERRING DR. DALTON MCLEAN DX-AMYLOIDOSIS

## 2014-12-18 NOTE — H&P (Signed)
Chief Complaint: Ischemic cardiomyopathy CHF Abnormal lab work up  Referring Physician(s): McLean,Dalton S  History of Present Illness: Bryan Lamb is a 69 y.o. male   Ischemic cardiomyopathy CHF Work up worrisome for amyloidosis Now scheduled for fat pad bx in Radiology  Last dose coumadin 5 days ago  Past Medical History  Diagnosis Date  . Urinary incontinence   . Nonischemic cardiomyopathy   . Chronic systolic dysfunction of left ventricle   . NSVT (nonsustained ventricular tachycardia)     Past Surgical History  Procedure Laterality Date  . Knee surgery    . Ankle fracture surgery    . Left heart catheterization with coronary angiogram N/A 09/04/2014    Procedure: LEFT HEART CATHETERIZATION WITH CORONARY ANGIOGRAM;  Surgeon: Sanda Klein, MD;  Location: [facility] CATH [facility];  Service: Cardiovascular;  Laterality: N/A;    Allergies: Review of patient's allergies indicates no known allergies.  Medications: Prior to Admission medications   Medication Sig Start Date End Date Taking? Authorizing Provider  atorvastatin (LIPITOR) 40 MG tablet Take 1 tablet (40 mg total) by mouth daily at 6 PM. 10/08/14  Yes Larey Dresser, MD  carvedilol (COREG) 12.5 MG tablet Take 1.5 tablets (18.75 mg total) by mouth 2 (two) times daily with a meal. 10/08/14  Yes Larey Dresser, MD  finasteride (PROSCAR) 5 MG tablet Take 5 mg by mouth daily. 07/05/14  Yes Historical Provider, MD  furosemide (LASIX) 40 MG tablet Take 1 tablet (40 mg total) by mouth daily. 09/07/14  Yes Rhonda G Barrett, PA-C  isosorbide-hydrALAZINE (BIDIL) 20-37.5 MG per tablet Take 1 tablet by mouth 3 (three) times daily. 11/05/14  Yes Larey Dresser, MD  nitroGLYCERIN (NITROSTAT) 0.4 MG SL tablet Place 1 tablet (0.4 mg total) under the tongue every 5 (five) minutes x 3 doses as needed for chest pain. 09/07/14  Yes Rhonda G Barrett, PA-C  sacubitril-valsartan (ENTRESTO) 24-26 MG Take 1 tablet by mouth 2 (two) times daily. 12/06/14   Yes Larey Dresser, MD  spironolactone (ALDACTONE) 25 MG tablet Take 1 tablet (25 mg total) by mouth daily. 09/07/14  Yes Rhonda G Barrett, PA-C  warfarin (COUMADIN) 7.5 MG tablet Take 1 tablet (7.5 mg total) by mouth one time only at 6 PM. Take as directed, start with 1 and 1/2 tablets daily. Patient taking differently: Take 7.5 mg by mouth one time only at 6 PM. Take 1 tablet daily, except take 1/2 tablet on Tuesday, Thursday, Saturday. 09/07/14  Yes Rhonda G Barrett, PA-C     Family History  Problem Relation Age of Onset  . Heart disease    . Stroke      History   Social History  . Marital Status: Married    Spouse Name: N/A  . Number of Children: N/A  . Years of Education: N/A   Social History Main Topics  . Smoking status: Light Tobacco Smoker  . Smokeless tobacco: Not on file  . Alcohol Use: No     Comment: remote heavy ETOH, none for years  . Drug Use: No  . Sexual Activity: Not on file   Other Topics Concern  . None   Social History Narrative   Pt lives in [place] with spouse.  Previously worked at [employer].  Retired.  2 grown daughters are healthy.     Review of Systems: A 12 point ROS discussed and pertinent positives are indicated in the HPI above.  All other systems are negative.  Review of  Systems  Constitutional: Negative for fever, activity change and fatigue.  HENT: Positive for hearing loss.   Respiratory: Positive for shortness of breath. Negative for cough and chest tightness.   Cardiovascular: Negative for chest pain and leg swelling.  Gastrointestinal: Negative for abdominal pain.  Musculoskeletal: Negative for back pain.  Neurological: Positive for weakness.  Psychiatric/Behavioral: Negative for confusion and decreased concentration.    Vital Signs: BP 89/50 mmHg  Pulse 65  Resp 18  Ht 5\' 7"  (1.702 m)  Wt 165 lb (74.844 kg)  BMI 25.84 kg/m2  SpO2 100%  Physical Exam  Constitutional: He is oriented to person, place, and  time.  Cardiovascular: Normal rate.   Pulmonary/Chest: Effort normal and breath sounds normal. He has no wheezes.  Abdominal: Bowel sounds are normal.  Musculoskeletal: Normal range of motion.  Neurological: He is alert and oriented to person, place, and time.  Skin: Skin is warm and dry.  Psychiatric: He has a normal mood and affect. His behavior is normal. Judgment and thought content normal.  Nursing note and vitals reviewed.   Mallampati Score:  MD Evaluation Airway: WNL Heart: WNL Abdomen: WNL Chest/ Lungs: WNL ASA  Classification: 3 Mallampati/Airway Score: One  Imaging: No results found.  Labs:  CBC:  Recent Labs  09/05/14 0418 09/06/14 0314 09/07/14 0413 12/18/14 1252  WBC 6.7 5.7 5.1 3.7*  HGB 13.1 13.6 13.7 10.5*  HCT 40.3 40.4 41.4 31.4*  PLT 184 175 182 185    COAGS:  Recent Labs  09/03/14 2104  11/19/14 1036 12/04/14 1056 12/17/14 1045 12/18/14 1252  INR 1.01  < > 1.8 3.9 1.6 1.29  APTT 29  --   --   --   --  32  < > = values in this interval not displayed.  BMP:  Recent Labs  09/13/14 1015 10/08/14 1020 11/05/14 1000 12/06/14 1015  NA 136 137 139 139  K 3.9 3.9 4.5 5.2*  CL 101 101 106 107  CO2 28 28 27 27   GLUCOSE 114* 91 103* 96  BUN 21 7 11  21*  CALCIUM 9.2 8.9 8.9 9.2  CREATININE 1.30 1.03 1.07 1.36*  GFRNONAA 54* 72* >60 51*  GFRAA 63* 84* >60 59*    LIVER FUNCTION TESTS:  Recent Labs  09/03/14 2104 10/08/14 1020  BILITOT 0.8 0.8  AST 22 24  ALT 13 26  ALKPHOS 67 83  PROT 6.9 6.4  ALBUMIN 3.3* 3.4*    TUMOR MARKERS: No results for input(s): AFPTM, CEA, CA199, CHROMGRNA in the last 8760 hours.  Assessment and Plan:  CHF Ischemic cardiomyopathy Concerning for amyloid Scheduled for fat pad bx Risks and Benefits discussed with the patient including, but not limited to bleeding, infection, damage to adjacent structures or low yield requiring additional tests. All of the patient's questions were answered,  patient is agreeable to proceed. Consent signed and in chart.    Thank you for this interesting consult.  I greatly enjoyed meeting Bryan Lamb and look forward to participating in their care.  Signed: Dante Cooter A 12/18/2014, 1:53 PM   I spent a total of  20 Minutes   in face to face in clinical consultation, greater than 50% of which was counseling/coordinating care for fat pad biopsy

## 2014-12-18 NOTE — Procedures (Signed)
RLQ Core Bx fat pad 18 g core times 6 No comp/EBL

## 2014-12-19 ENCOUNTER — Encounter: Payer: Self-pay | Admitting: *Deleted

## 2014-12-19 ENCOUNTER — Telehealth: Payer: Self-pay | Admitting: Internal Medicine

## 2014-12-19 ENCOUNTER — Telehealth: Payer: Self-pay | Admitting: *Deleted

## 2014-12-19 NOTE — Telephone Encounter (Signed)
Patient called stating they were returning phone call    Please advise    Thank you

## 2014-12-19 NOTE — Telephone Encounter (Signed)
Scheduled pt's uptake and scan. Wednesday, June 29th and Thursday, June 30th at 1:00 pm (arrival time at 12:30 pm). Called pt and lvm advising him of the dates and times. Letter mailed as well. Be advised.

## 2014-12-21 ENCOUNTER — Ambulatory Visit (HOSPITAL_COMMUNITY)
Admission: RE | Admit: 2014-12-21 | Discharge: 2014-12-21 | Disposition: A | Discharge status: Home / Self Care | Payer: Medicare Other | Source: Ambulatory Visit | Attending: Cardiology | Admitting: Cardiology

## 2014-12-21 DIAGNOSIS — I5021 Acute systolic (congestive) heart failure: Secondary | ICD-10-CM | POA: Insufficient documentation

## 2014-12-21 DIAGNOSIS — I5022 Chronic systolic (congestive) heart failure: Secondary | ICD-10-CM | POA: Diagnosis not present

## 2014-12-21 LAB — BASIC METABOLIC PANEL
ANION GAP: 4 — AB (ref 5–15)
BUN: 20 mg/dL (ref 6–20)
CO2: 27 mmol/L (ref 22–32)
Calcium: 8.8 mg/dL — ABNORMAL LOW (ref 8.9–10.3)
Chloride: 109 mmol/L (ref 101–111)
Creatinine, Ser: 1.22 mg/dL (ref 0.61–1.24)
GFR calc Af Amer: >60 mL/min (ref >60)
GFR calc non Af Amer: 58 mL/min — ABNORMAL LOW (ref >60)
GLUCOSE: 114 mg/dL — AB (ref 65–99)
POTASSIUM: 4 mmol/L (ref 3.5–5.1)
SODIUM: 140 mmol/L (ref 135–145)

## 2014-12-21 NOTE — Telephone Encounter (Signed)
Advised pt that we scheduled his uptake and scan. Letter mailed to patient with dates and times.

## 2014-12-27 ENCOUNTER — Ambulatory Visit (INDEPENDENT_AMBULATORY_CARE_PROVIDER_SITE_OTHER): Payer: Medicare Other | Admitting: *Deleted

## 2014-12-27 DIAGNOSIS — R7989 Other specified abnormal findings of blood chemistry: Secondary | ICD-10-CM

## 2014-12-27 DIAGNOSIS — Z5181 Encounter for therapeutic drug level monitoring: Secondary | ICD-10-CM

## 2014-12-27 DIAGNOSIS — I409 Acute myocarditis, unspecified: Secondary | ICD-10-CM

## 2014-12-27 DIAGNOSIS — R778 Other specified abnormalities of plasma proteins: Secondary | ICD-10-CM

## 2014-12-27 LAB — POCT INR: INR: 2.1

## 2015-01-02 ENCOUNTER — Encounter (HOSPITAL_COMMUNITY): Payer: Medicare Other

## 2015-01-03 ENCOUNTER — Encounter (HOSPITAL_COMMUNITY): Payer: Medicare Other

## 2015-01-08 ENCOUNTER — Telehealth: Payer: Self-pay

## 2015-01-08 NOTE — Telephone Encounter (Signed)
-----   Message from Philemon Kingdom, MD sent at 12/11/2014  8:04 PM EDT ----- Larene Beach, can you please call [facility] are finally back: TSH is still low, I would suggest an Uptake and scan (I explained about the test at the time of the visit) >> ordered for [facility].

## 2015-01-08 NOTE — Telephone Encounter (Signed)
Left pt a Vm to return call for results.

## 2015-01-09 ENCOUNTER — Encounter (HOSPITAL_COMMUNITY)
Admission: RE | Admit: 2015-01-09 | Discharge: 2015-01-09 | Disposition: A | Discharge status: Home / Self Care | Payer: Medicare Other | Source: Ambulatory Visit | Attending: Internal Medicine | Admitting: Internal Medicine

## 2015-01-09 DIAGNOSIS — E0590 Thyrotoxicosis, unspecified without thyrotoxic crisis or storm: Secondary | ICD-10-CM | POA: Insufficient documentation

## 2015-01-10 ENCOUNTER — Ambulatory Visit (INDEPENDENT_AMBULATORY_CARE_PROVIDER_SITE_OTHER): Payer: Medicare Other | Admitting: *Deleted

## 2015-01-10 ENCOUNTER — Encounter (HOSPITAL_COMMUNITY)
Admission: RE | Admit: 2015-01-10 | Discharge: 2015-01-10 | Disposition: A | Discharge status: Home / Self Care | Payer: Medicare Other | Source: Ambulatory Visit | Attending: Internal Medicine | Admitting: Internal Medicine

## 2015-01-10 DIAGNOSIS — R778 Other specified abnormalities of plasma proteins: Secondary | ICD-10-CM

## 2015-01-10 DIAGNOSIS — E0590 Thyrotoxicosis, unspecified without thyrotoxic crisis or storm: Secondary | ICD-10-CM | POA: Diagnosis not present

## 2015-01-10 DIAGNOSIS — Z5181 Encounter for therapeutic drug level monitoring: Secondary | ICD-10-CM | POA: Diagnosis not present

## 2015-01-10 DIAGNOSIS — I409 Acute myocarditis, unspecified: Secondary | ICD-10-CM

## 2015-01-10 DIAGNOSIS — R7989 Other specified abnormal findings of blood chemistry: Secondary | ICD-10-CM

## 2015-01-10 LAB — POCT INR: INR: 2.2

## 2015-01-10 MED ORDER — SODIUM PERTECHNETATE TC 99M INJECTION
10.5000 | Freq: Once | INTRAVENOUS | Status: AC | PRN
Start: 2015-01-10 — End: 2015-01-10
  Administered 2015-01-10: 11 via INTRAVENOUS

## 2015-01-10 MED ORDER — SODIUM IODIDE I 131 CAPSULE
14.0000 | Freq: Once | INTRAVENOUS | Status: AC | PRN
  Administered 2015-01-10: 14 via ORAL

## 2015-01-11 ENCOUNTER — Telehealth: Payer: Self-pay | Admitting: Internal Medicine

## 2015-01-11 NOTE — Addendum Note (Signed)
Addended by: Philemon Kingdom on: 01/11/2015 07:58 AM   Modules accepted: Orders, Level of Service

## 2015-01-11 NOTE — Telephone Encounter (Signed)
returned call and lvm confirming appt.... °

## 2015-01-14 ENCOUNTER — Other Ambulatory Visit: Payer: Medicare Other

## 2015-01-14 ENCOUNTER — Encounter: Payer: Self-pay | Admitting: Internal Medicine

## 2015-01-14 ENCOUNTER — Ambulatory Visit: Payer: Medicare Other

## 2015-01-14 ENCOUNTER — Ambulatory Visit (HOSPITAL_BASED_OUTPATIENT_CLINIC_OR_DEPARTMENT_OTHER): Payer: Medicare Other | Admitting: Internal Medicine

## 2015-01-14 ENCOUNTER — Other Ambulatory Visit: Payer: Self-pay | Admitting: *Deleted

## 2015-01-14 ENCOUNTER — Telehealth: Payer: Self-pay | Admitting: Internal Medicine

## 2015-01-14 ENCOUNTER — Ambulatory Visit (HOSPITAL_BASED_OUTPATIENT_CLINIC_OR_DEPARTMENT_OTHER): Payer: Medicare Other

## 2015-01-14 VITALS — BP 145/66 | HR 46 | Temp 98.4°F | Resp 18 | Ht 67.0 in | Wt 166.5 lb

## 2015-01-14 DIAGNOSIS — Z720 Tobacco use: Secondary | ICD-10-CM | POA: Diagnosis not present

## 2015-01-14 DIAGNOSIS — I509 Heart failure, unspecified: Secondary | ICD-10-CM | POA: Diagnosis not present

## 2015-01-14 DIAGNOSIS — D472 Monoclonal gammopathy: Secondary | ICD-10-CM

## 2015-01-14 DIAGNOSIS — I429 Cardiomyopathy, unspecified: Secondary | ICD-10-CM | POA: Diagnosis not present

## 2015-01-14 LAB — COMPREHENSIVE METABOLIC PANEL (CC13)
ALT: 16 U/L (ref 0–55)
ANION GAP: 8 meq/L (ref 3–11)
AST: 17 U/L (ref 5–34)
Albumin: 3.8 g/dL (ref 3.5–5.0)
Alkaline Phosphatase: 70 U/L (ref 40–150)
BILIRUBIN TOTAL: 0.82 mg/dL (ref 0.20–1.20)
BUN: 16.4 mg/dL (ref 7.0–26.0)
CHLORIDE: 108 meq/L (ref 98–109)
CO2: 26 mEq/L (ref 22–29)
Calcium: 9.3 mg/dL (ref 8.4–10.4)
Creatinine: 1.2 mg/dL (ref 0.7–1.3)
EGFR: 68 mL/min/{1.73_m2} — AB (ref >90)
Glucose: 81 mg/dl (ref 70–140)
Potassium: 4 mEq/L (ref 3.5–5.1)
Sodium: 141 mEq/L (ref 136–145)
Total Protein: 7.7 g/dL (ref 6.4–8.3)

## 2015-01-14 LAB — CBC WITH DIFFERENTIAL/PLATELET
BASO%: 0.1 % (ref 0.0–2.0)
BASOS ABS: 0 10*3/uL (ref 0.0–0.1)
EOS%: 3 % (ref 0.0–7.0)
Eosinophils Absolute: 0.1 10*3/uL (ref 0.0–0.5)
HCT: 32.1 % — ABNORMAL LOW (ref 38.4–49.9)
HGB: 10.5 g/dL — ABNORMAL LOW (ref 13.0–17.1)
LYMPH%: 33.7 % (ref 14.0–49.0)
MCH: 29.4 pg (ref 27.2–33.4)
MCHC: 32.6 g/dL (ref 32.0–36.0)
MCV: 90 fL (ref 79.3–98.0)
MONO#: 0.5 10*3/uL (ref 0.1–0.9)
MONO%: 10.7 % (ref 0.0–14.0)
NEUT%: 52.5 % (ref 39.0–75.0)
NEUTROS ABS: 2.2 10*3/uL (ref 1.5–6.5)
PLATELETS: 175 10*3/uL (ref 140–400)
RBC: 3.56 10*6/uL — ABNORMAL LOW (ref 4.20–5.82)
RDW: 17.4 % — AB (ref 11.0–14.6)
WBC: 4.3 10*3/uL (ref 4.0–10.3)
lymph#: 1.4 10*3/uL (ref 0.9–3.3)

## 2015-01-14 LAB — LACTATE DEHYDROGENASE (CC13): LDH: 267 U/L — AB (ref 125–245)

## 2015-01-14 NOTE — Progress Notes (Signed)
[place] [phone]   [phone]  CONSULT NOTE  REFERRING PHYSICIAN: Dr. Loralie Champagne  REASON FOR CONSULTATION:  69 years old African-American male with monoclonal ecchymoses and concern about amyloidosis.  HPI Bryan Lamb is a 69 y.o. male with past medical history significant for congestive heart failure, cardiomyopathy, myocardial infarction in February 2016, dyslipidemia, urinary incontinence as well as hyperthyroidism. The patient mentions that in February 2016 he had myocardial infarction and was evaluated by Dr. Aundra Dubin. He was also fine to have cardiomyopathy. He had several studies for evaluation of his condition including quantitative immunoglobulin with immunofixation on 12/06/2014 that showed normal IgG of 1102, IgA 166 and IgM 44. 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 Urine protein electrophoreses showed no M spike but the immunofixation also showed IgG monoclonal protein with kappa light chain specificity. There was a concern about Amyloidosis. The patient was referred to interventional radiology and underwent ultrasound-guided fat pad core biopsy but the final pathology was negative for Amyloidosis.  The patient was referred to me today for further evaluation and recommendation regarding his condition. When seen today he denied having any specific complaints. He is feeling fine. He denied having any significant chest pain, shortness breath, cough or hemoptysis. He denied having any significant weight loss or night sweats. He has no nausea or vomiting. The patient denied having any fever or chills. Family history significant for mother with stroke and father with heart disease. The patient is married and was accompanied by his wife, Bryan Lamb. 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 He has a history of smoking up to 2 packs per day for around 52  years and unfortunately he continues to smoke. He has no current history of alcohol or drug abuse.    HPI  Past Medical History  Diagnosis Date  . Urinary incontinence   . Nonischemic cardiomyopathy   . Chronic systolic dysfunction of left ventricle   . NSVT (nonsustained ventricular tachycardia)     Past Surgical History  Procedure Laterality Date  . Knee surgery    . Ankle fracture surgery    . Left heart catheterization with coronary angiogram N/A 09/04/2014    Procedure: LEFT HEART CATHETERIZATION WITH CORONARY ANGIOGRAM;  Surgeon: Sanda Klein, MD;  Location: [facility];  Service: Cardiovascular;  Laterality: N/A;    Family History  Problem Relation Age of Onset  . Heart disease    . Stroke      Social History History  Substance Use Topics  . Smoking status: Heavy Tobacco Smoker -- 0.50 packs/day for 52 years  . Smokeless tobacco: Not on file  . Alcohol Use: No     Comment: remote heavy ETOH, none for years    No Known Allergies  Current Outpatient Prescriptions  Medication Sig Dispense Refill  . atorvastatin (LIPITOR) 80 MG tablet   1  . carvedilol (COREG) 12.5 MG tablet Take 1.5 tablets (18.75 mg total) by mouth 2 (two) times daily with a meal. 90 tablet 3  . finasteride (PROSCAR) 5 MG tablet Take 5 mg by mouth daily.  0  . furosemide (LASIX) 40 MG tablet Take 1 tablet (40 mg total) by mouth daily. 30 tablet 11  . isosorbide-hydrALAZINE (BIDIL) 20-37.5 MG per tablet Take 1 tablet by mouth 3 (three) times daily. 90 tablet 3  . nitroGLYCERIN (NITROSTAT) 0.4 MG SL tablet Place 1 tablet (0.4 mg total) under the tongue  every 5 (five) minutes x 3 doses as needed for chest pain. 25 tablet 12  . sacubitril-valsartan (ENTRESTO) 24-26 MG Take 1 tablet by mouth 2 (two) times daily. 60 tablet 3  . spironolactone (ALDACTONE) 25 MG tablet Take 1 tablet (25 mg total) by mouth daily. 30 tablet 11  . warfarin (COUMADIN) 7.5 MG tablet Take 1 tablet (7.5 mg total) by mouth one  time only at 6 PM. Take as directed, start with 1 and 1/2 tablets daily. (Patient taking differently: Take 7.5 mg by mouth one time only at 6 PM. Take 1 tablet daily, except take 1/2 tablet on Tuesday, Thursday, Saturday.) 60 tablet 3   No current facility-administered medications for this visit.    Review of Systems  Constitutional: negative Eyes: negative Ears, nose, mouth, throat, and face: negative Respiratory: negative Cardiovascular: negative Gastrointestinal: negative Genitourinary:negative Integument/breast: negative Hematologic/lymphatic: negative Musculoskeletal:negative Neurological: negative Behavioral/Psych: negative Endocrine: negative Allergic/Immunologic: negative  Physical Exam  QJF:HLKTG, healthy, no distress, well nourished and well developed SKIN: skin color, texture, turgor are normal, no rashes or significant lesions HEAD: Normocephalic, No masses, lesions, tenderness or abnormalities EYES: normal, PERRLA, Conjunctiva are pink and non-injected EARS: External ears normal, Canals clear OROPHARYNX:no exudate, no erythema and lips, buccal mucosa, and tongue normal  NECK: supple, no adenopathy, no JVD LYMPH:  no palpable lymphadenopathy, no hepatosplenomegaly LUNGS: clear to auscultation , and palpation HEART: regular rate & rhythm and no murmurs ABDOMEN:abdomen soft, non-tender, normal bowel sounds and no masses or organomegaly BACK: Back symmetric, no curvature., No CVA tenderness EXTREMITIES:no joint deformities, effusion, or inflammation, no edema, no skin discoloration, no clubbing  NEURO: alert & oriented x 3 with fluent speech, no focal motor/sensory deficits  PERFORMANCE STATUS: ECOG 1  LABORATORY DATA: Lab Results  Component Value Date   WBC 3.7* 12/18/2014   HGB 10.5* 12/18/2014   HCT 31.4* 12/18/2014   MCV 84.4 12/18/2014   PLT 185 12/18/2014      Chemistry      Component Value Date/Time   NA 140 12/21/2014 0900   K 4.0 12/21/2014  0900   CL 109 12/21/2014 0900   CO2 27 12/21/2014 0900   BUN 20 12/21/2014 0900   CREATININE 1.22 12/21/2014 0900      Component Value Date/Time   CALCIUM 8.8* 12/21/2014 0900   ALKPHOS 83 10/08/2014 1020   AST 24 10/08/2014 1020   ALT 26 10/08/2014 1020   BILITOT 0.8 10/08/2014 1020       RADIOGRAPHIC STUDIES: Nm Thyroid Sng Uptake W/imaging  01/10/2015   CLINICAL DATA:  Subclinical hyperthyroidism  EXAM: THYROID SCAN AND UPTAKE - 24 HOURS  TECHNIQUE: Following the per oral administration of I-131 sodium iodide, the patient returned at 24 hours and uptake measurements were acquired with the uptake probe centered on the neck. Thyroid imaging was performed following the intravenous administration of the Tc-70m Pertechnetate.  RADIOPHARMACEUTICALS:  14.0 microCuries I-131 sodium iodide orally and 10.5 mCi Technetium-38m pertechnetate IV  COMPARISON:  None  FINDINGS: 24 hour radio iodine uptake calculated at 9%, slightly below the normal range.  Imaging of the thyroid gland demonstrates poor tracer localization within the thyroid lobes, with a few small areas of tracer localization seen with additional areas of decreased tracer localization.  Findings suggest presence of an enlarged multinodular thyroid gland though localization is insufficient for adequate characterization.  Potentially the thyroid gland could extend into the superior mediastinum.  Subjectively, I would have estimated the 24 hour uptake to be less than  9% based on the poor localization of pertechnetate on imaging.  IMPRESSION: Slightly decreased 24 hour radio iodine uptake of 9%.  Poor tracer localization within the thyroid gland, which appears enlarged and multinodular and could potentially be extending into the superior mediastinum.   Electronically Signed   By: Lavonia Dana M.D.   On: 01/10/2015 14:49   US Biopsy  12/18/2014   CLINICAL DATA:  Cardiomyopathy  EXAM: ULTRASOUND-GUIDED FAT PAD CORE BIOPSY.  MEDICATIONS AND MEDICAL  HISTORY: Versed 1 mg, Fentanyl 25 mcg.  Additional Medications: None.  ANESTHESIA/SEDATION: Moderate sedation time: 10 minutes  PROCEDURE: The procedure, risks, benefits, and alternatives were explained to the patient. Questions regarding the procedure were encouraged and answered. The patient understands and consents to the procedure.  The right lower quadrant was prepped with Betadine in a sterile fashion, and a sterile drape was applied covering the operative field. A sterile gown and sterile gloves were used for the procedure.  Under sonographic guidance, 6 18 gauge core biopsies of the subcutaneous fat in the right lower quadrant were obtain. Final imaging was performed.  Patient tolerated the procedure well without complication. Vital sign monitoring by nursing staff during the procedure will continue as patient is in the special procedures unit for post procedure observation.  FINDINGS: The images document guide needle placement within the right lower quadrant subcutaneous fat. Post biopsy images demonstrate no hemorrhage.  COMPLICATIONS: Successful right lower quadrant ultrasound-guided subcutaneous fat pad core biopsy.  IMPRESSION: Successful ultrasound-guided fine needle aspiration and core biopsy of .   Electronically Signed   By: Marybelle Killings M.D.   On: 12/18/2014 15:57    ASSESSMENT: This is a very pleasant 69 years old African-American male with history of cardiac disease as well as congestive heart failure and cardiomyopathy. 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 His recent fat pad core biopsy was negative for amyloidosis.   PLAN: I had a lengthy discussion with the patient and his wife today about his condition and further investigation to confirm or rule out his diagnosis. I recommended for the patient to have repeat myeloma panel performed today. I will also arrange for the patient to have  a bone marrow biopsy and aspirate. We will send the specimen for myeloma panel as well as [place] red stains. I will see the patient back for follow-up visit in 3 weeks for reevaluation and discussion of his lab and biopsy results as well as further recommendation regarding his condition. For smoke cessation, I strongly encouraged the patient to quit smoking and offered him a smoking cessation program. The patient was advised to call immediately if he has any concerning symptoms in the interval. The patient voices understanding of current disease status and treatment options and is in agreement with the current care plan.  All questions were answered. The patient knows to call the clinic with any problems, questions or concerns. We can certainly see the patient much sooner if necessary.  Thank you so much for allowing me to participate in the care of Bryan Lamb. I will continue to follow up the patient with you and assist in his care.  I spent 35 minutes counseling the patient face to face. The total time spent in the appointment was 55 minutes.  Disclaimer: This note was dictated with voice recognition software. Similar sounding words can inadvertently be transcribed and may not be corrected upon review.   Britteney Ayotte K. January 14, 2015, 12:17 PM

## 2015-01-14 NOTE — Telephone Encounter (Signed)
per pof to sch pt appt-gave pt copy of avs °

## 2015-01-14 NOTE — Progress Notes (Signed)
Checked in new pt with no financial concerns prior to seeing the dr.  Pt has my card for any billing questions or concerns. ° °

## 2015-01-16 LAB — KAPPA/LAMBDA LIGHT CHAINS
KAPPA FREE LGHT CHN: 3.62 mg/dL — AB (ref 0.33–1.94)
Kappa:Lambda Ratio: 1.12 (ref 0.26–1.65)
Lambda Free Lght Chn: 3.24 mg/dL — ABNORMAL HIGH (ref 0.57–2.63)

## 2015-01-16 LAB — IGG, IGA, IGM
IGA: 249 mg/dL (ref 68–379)
IgG (Immunoglobin G), Serum: 1730 mg/dL — ABNORMAL HIGH (ref 650–1600)
IgM, Serum: 84 mg/dL (ref 41–251)

## 2015-01-16 LAB — BETA 2 MICROGLOBULIN, SERUM: BETA 2 MICROGLOBULIN: 2.51 mg/L (ref <=2.51)

## 2015-01-22 ENCOUNTER — Ambulatory Visit (HOSPITAL_COMMUNITY): Payer: Medicare Other

## 2015-01-28 ENCOUNTER — Other Ambulatory Visit: Payer: Self-pay | Admitting: Radiology

## 2015-01-29 ENCOUNTER — Ambulatory Visit (HOSPITAL_COMMUNITY)
Admission: RE | Admit: 2015-01-29 | Discharge: 2015-01-29 | Disposition: A | Discharge status: Home / Self Care | Payer: Medicare Other | Source: Ambulatory Visit | Attending: Internal Medicine | Admitting: Internal Medicine

## 2015-01-29 ENCOUNTER — Encounter (HOSPITAL_COMMUNITY): Payer: Self-pay

## 2015-01-29 DIAGNOSIS — D72819 Decreased white blood cell count, unspecified: Secondary | ICD-10-CM | POA: Diagnosis not present

## 2015-01-29 DIAGNOSIS — D649 Anemia, unspecified: Secondary | ICD-10-CM | POA: Insufficient documentation

## 2015-01-29 DIAGNOSIS — D472 Monoclonal gammopathy: Secondary | ICD-10-CM | POA: Diagnosis not present

## 2015-01-29 DIAGNOSIS — D7589 Other specified diseases of blood and blood-forming organs: Secondary | ICD-10-CM | POA: Diagnosis not present

## 2015-01-29 LAB — CBC WITH DIFFERENTIAL/PLATELET
BASOS ABS: 0 10*3/uL (ref 0.0–0.1)
BASOS PCT: 1 % (ref 0–1)
Eosinophils Absolute: 0.1 10*3/uL (ref 0.0–0.7)
Eosinophils Relative: 4 % (ref 0–5)
HEMATOCRIT: 31.3 % — AB (ref 39.0–52.0)
Hemoglobin: 10.2 g/dL — ABNORMAL LOW (ref 13.0–17.0)
LYMPHS ABS: 1.2 10*3/uL (ref 0.7–4.0)
Lymphocytes Relative: 35 % (ref 12–46)
MCH: 29.7 pg (ref 26.0–34.0)
MCHC: 32.6 g/dL (ref 30.0–36.0)
MCV: 91.3 fL (ref 78.0–100.0)
Monocytes Absolute: 0.4 10*3/uL (ref 0.1–1.0)
Monocytes Relative: 12 % (ref 3–12)
NEUTROS PCT: 48 % (ref 43–77)
Neutro Abs: 1.6 10*3/uL — ABNORMAL LOW (ref 1.7–7.7)
PLATELETS: 153 10*3/uL (ref 150–400)
RBC: 3.43 MIL/uL — ABNORMAL LOW (ref 4.22–5.81)
RDW: 15.4 % (ref 11.5–15.5)
WBC: 3.3 10*3/uL — ABNORMAL LOW (ref 4.0–10.5)

## 2015-01-29 LAB — PROTIME-INR
INR: 2.32 — ABNORMAL HIGH (ref 0.00–1.49)
PROTHROMBIN TIME: 25.3 s — AB (ref 11.6–15.2)

## 2015-01-29 LAB — BONE MARROW EXAM

## 2015-01-29 MED ORDER — FENTANYL CITRATE (PF) 100 MCG/2ML IJ SOLN
INTRAMUSCULAR | Status: AC | PRN
  Administered 2015-01-29: 50 ug via INTRAVENOUS

## 2015-01-29 MED ORDER — SODIUM CHLORIDE 0.9 % IV SOLN
INTRAVENOUS | Status: DC
  Administered 2015-01-29: 08:00:00 via INTRAVENOUS

## 2015-01-29 MED ORDER — MIDAZOLAM HCL 2 MG/2ML IJ SOLN
INTRAMUSCULAR | Status: AC
  Filled 2015-01-29: qty 6

## 2015-01-29 MED ORDER — MIDAZOLAM HCL 2 MG/2ML IJ SOLN
INTRAMUSCULAR | Status: AC | PRN
  Administered 2015-01-29 (×2): 1 mg via INTRAVENOUS

## 2015-01-29 MED ORDER — FENTANYL CITRATE (PF) 100 MCG/2ML IJ SOLN
INTRAMUSCULAR | Status: AC
  Filled 2015-01-29: qty 4

## 2015-01-29 NOTE — Progress Notes (Signed)
Dr. Earleen Newport made aware of fact patient on coumadin

## 2015-01-29 NOTE — Progress Notes (Signed)
Patient ID: Bryan Lamb, male   DOB: 11-01-1945, 68 y.o.   MRN: [id number]    Referring Physician(s): Mohamed,Mohamed  Chief Complaint:  "I'm here for a bone marrow biopsy"  Subjective: Pt familiar to IR service from recent fat pad biopsy on 12/18/2014 (negative for amyloidosis). He has hx of CHF, cardiomyopathy and monoclonal gammopathy. He presents today for CT guided bone marrow biopsy for further evaluation. He is currently asymptomatic.     Allergies: Review of patient's allergies indicates no known allergies.  Medications: Prior to Admission medications   Medication Sig Start Date End Date Taking? Authorizing Provider  atorvastatin (LIPITOR) 80 MG tablet  01/11/15  Yes Historical Provider, MD  carvedilol (COREG) 12.5 MG tablet Take 1.5 tablets (18.75 mg total) by mouth 2 (two) times daily with a meal. 10/08/14  Yes Larey Dresser, MD  finasteride (PROSCAR) 5 MG tablet Take 5 mg by mouth daily. 07/05/14  Yes Historical Provider, MD  furosemide (LASIX) 40 MG tablet Take 1 tablet (40 mg total) by mouth daily. 09/07/14  Yes Rhonda G Barrett, PA-C  isosorbide-hydrALAZINE (BIDIL) 20-37.5 MG per tablet Take 1 tablet by mouth 3 (three) times daily. 11/05/14  Yes Larey Dresser, MD  nitroGLYCERIN (NITROSTAT) 0.4 MG SL tablet Place 1 tablet (0.4 mg total) under the tongue every 5 (five) minutes x 3 doses as needed for chest pain. 09/07/14  Yes Rhonda G Barrett, PA-C  spironolactone (ALDACTONE) 25 MG tablet Take 1 tablet (25 mg total) by mouth daily. 09/07/14  Yes Rhonda G Barrett, PA-C  warfarin (COUMADIN) 7.5 MG tablet Take 1 tablet (7.5 mg total) by mouth one time only at 6 PM. Take as directed, start with 1 and 1/2 tablets daily. Patient taking differently: Take 7.5 mg by mouth one time only at 6 PM. Take 1 tablet daily, except take 1/2 tablet on Tuesday, Thursday, Saturday. 09/07/14  Yes Rhonda G Barrett, PA-C  sacubitril-valsartan (ENTRESTO) 24-26 MG Take 1 tablet by mouth 2 (two) times  daily. Patient not taking: Reported on 01/29/2015 12/06/14   Larey Dresser, MD     Vital Signs: BP 124/82 mmHg  Pulse 56  Temp(Src) 98.2 F (36.8 C) (Oral)  Ht _0  (1.702 m)  Wt 165 lb (74.844 kg)  BMI 25.84 kg/m2  SpO2 100%  Physical Exam  Constitutional: He is oriented to person, place, and time. He appears well-developed and well-nourished.  Cardiovascular:  Bradycardic but regular  Pulmonary/Chest: Effort normal.  Few scatt rhonchi  Abdominal: Soft. Bowel sounds are normal. There is no tenderness.  Musculoskeletal: Normal range of motion. He exhibits no edema.  Neurological: He is alert and oriented to person, place, and time.    Imaging: No results found.  Labs:  CBC:  Recent Labs  09/07/14 0413 12/18/14 1252 01/14/15 1243 01/29/15 0735  WBC 5.1 3.7* 4.3 3.3*  HGB 13.7 10.5* 10.5* 10.2*  HCT 41.4 31.4* 32.1* 31.3*  PLT 182 185 175 153    COAGS:  Recent Labs  09/03/14 2104  12/18/14 1252 12/27/14 0948 01/10/15 1102 01/29/15 0735  INR 1.01  < > 1.29 2.1 2.2 2.32*  APTT 29  --  32  --   --   --   < > = values in this interval not displayed.  BMP:  Recent Labs  10/08/14 1020 11/05/14 1000 12/06/14 1015 12/21/14 0900 01/14/15 1243  NA 137 139 139 140 141  K 3.9 4.5 5.2* 4.0 4.0  CL 101 106 107 109  --  CO2 _0 GLUCOSE 91 103* 96 114* 81  BUN 7 11 21* 20 16.4  CALCIUM 8.9 8.9 9.2 8.8* 9.3  CREATININE 1.03 1.07 1.36* 1.22 1.2  GFRNONAA 72* >60 51* 58*  --   GFRAA 84* >60 59* >60  --     LIVER FUNCTION TESTS:  Recent Labs  09/03/14 2104 10/08/14 1020 01/14/15 1243  BILITOT 0.8 0.8 0.82  AST _1 ALT _2 ALKPHOS 67 83 70  PROT 6.9 6.4 7.7  ALBUMIN 3.3* 3.4* 3.8    Assessment and Plan: Pt with hx of CHF, cardiomyopathy and monoclonal gammopathy. Plan is for CT guided bone marrow biopsy today for further evaluation. Risks and benefits discussed with the patient/wife including, but not limited to  bleeding, infection, damage to adjacent structures or low yield requiring additional tests.All of the patient's questions were answered, patient is agreeable to proceed.Consent signed and in chart.     Signed: D. Rowe Robert 01/29/2015, 8:23 AM   I spent a total of 15 minutes in face to face in clinical consultation/evaluation, greater than 50% of which was counseling/coordinating care for CT guided bone marrow biopsy

## 2015-01-29 NOTE — Procedures (Signed)
Interventional Radiology Procedure Note  Procedure: CT guided aspirate and core biopsy of right posterior iliac bone Complications: None Recommendations: - Bedrest supine x 1 hrs - OTC for soreness - Follow biopsy results  Signed,  Dulcy Fanny. Earleen Newport, DO

## 2015-01-29 NOTE — Discharge Instructions (Signed)
Bone Marrow Aspiration, Bone Marrow Biopsy Care After Read the instructions outlined below and refer to this sheet in the next few weeks. These discharge instructions provide you with general information on caring for yourself after you leave the hospital. Your caregiver may also give you specific instructions. While your treatment has been planned according to the most current medical practices available, unavoidable complications occasionally occur. If you have any problems or questions after discharge, call your caregiver. FINDING OUT THE RESULTS OF YOUR TEST Not all test results are available during your visit. If your test results are not back during the visit, make an appointment with your caregiver to find out the results. Do not assume everything is normal if you have not heard from your caregiver or the medical facility. It is important for you to follow up on all of your test results.  HOME CARE INSTRUCTIONS  You have had sedation and may be sleepy or dizzy. Your thinking may not be as clear as usual. For the next 24 hours:  Only take over-the-counter or prescription medicines for pain, discomfort, and or fever as directed by your caregiver.  Do not drink alcohol.  Do not smoke.  Do not drive.  Do not make important legal decisions.  Do not operate heavy machinery.  Do not care for small children by yourself.  Keep your dressing clean and dry. You may replace dressing with a bandage after 24 hours.  You may take a bath or shower after 24 hours.  Use an ice pack for 20 minutes every 2 hours while awake for pain as needed. SEEK MEDICAL CARE IF:   There is redness, swelling, or increasing pain at the biopsy site.  There is pus coming from the biopsy site.  There is drainage from a biopsy site lasting longer than one day.  An unexplained oral temperature above 102 F (38.9 C) develops. SEEK IMMEDIATE MEDICAL CARE IF:   You develop a rash.  You have difficulty  breathing.  You develop any reaction or side effects to medications given. Document Released: 01/09/2005 Document Revised: 09/14/2011 Document Reviewed: 06/19/2008 [facility] Patient Information 2015 [place], [place]. This information is not intended to replace advice given to you by your health care provider. Make sure you discuss any questions you have with your health care prov                                                                             Conscious Sedation, Adult, Care After Refer to this sheet in the next few weeks. These instructions provide you with information on caring for yourself after your procedure. Your health care provider may also give you more specific instructions. Your treatment has been planned according to current medical practices, but problems sometimes occur. Call your health care provider if you have any problems or questions after your procedure. WHAT TO EXPECT AFTER THE PROCEDURE  After your procedure:  You may feel sleepy, clumsy, and have poor balance for several hours.  Vomiting may occur if you eat too soon after the procedure. HOME CARE INSTRUCTIONS  Do not participate in any activities where you could become injured for at least 24 hours. Do not:  Drive.  Swim.  Ride a bicycle.  Operate heavy machinery.  Cook.  Use power tools.  Climb ladders.  Work from a high place.  Do not make important decisions or sign legal documents until you are improved.  If you vomit, drink water, juice, or soup when you can drink without vomiting. Make sure you have little or no nausea before eating solid foods.  Only take over-the-counter or prescription medicines for pain, discomfort, or fever as directed by your health care provider.  Make sure you and your family fully understand everything about the medicines given to you, including what side effects may occur.  You should not drink alcohol, take sleeping pills, or take medicines that cause  drowsiness for at least 24 hours.  If you smoke, do not smoke without supervision.  If you are feeling better, you may resume normal activities 24 hours after you were sedated.  Keep all appointments with your health care provider. SEEK MEDICAL CARE IF:  Your skin is pale or bluish in color.  You continue to feel nauseous or vomit.  Your pain is getting worse and is not helped by medicine.  You have bleeding or swelling.  You are still sleepy or feeling clumsy after 24 hours. SEEK IMMEDIATE MEDICAL CARE IF:  You develop a rash.  You have difficulty breathing.  You develop any type of allergic problem.  You have a fever. MAKE SURE YOU:  Understand these instructions.  Will watch your condition.  Will get help right away if you are not doing well or get worse. Document Released: 04/12/2013 Document Reviewed: 04/12/2013 [facility] Patient Information 2015 [place], [place]. This information is not intended to replace advice given to you by your health care provider. Make sure you discuss any questions you have with your health care provider.

## 2015-01-31 ENCOUNTER — Ambulatory Visit (INDEPENDENT_AMBULATORY_CARE_PROVIDER_SITE_OTHER): Payer: Medicare Other | Admitting: *Deleted

## 2015-01-31 DIAGNOSIS — Z5181 Encounter for therapeutic drug level monitoring: Secondary | ICD-10-CM

## 2015-01-31 DIAGNOSIS — I409 Acute myocarditis, unspecified: Secondary | ICD-10-CM

## 2015-01-31 DIAGNOSIS — R778 Other specified abnormalities of plasma proteins: Secondary | ICD-10-CM

## 2015-01-31 DIAGNOSIS — R7989 Other specified abnormal findings of blood chemistry: Secondary | ICD-10-CM

## 2015-01-31 LAB — POCT INR: INR: 2.8

## 2015-02-05 ENCOUNTER — Ambulatory Visit: Payer: Medicare Other | Admitting: Internal Medicine

## 2015-02-05 ENCOUNTER — Telehealth: Payer: Self-pay | Admitting: Internal Medicine

## 2015-02-05 NOTE — Telephone Encounter (Signed)
Pt came in thought time was later, advised that they wanted to r/s, pt confirmed MD visit New D/T... KJ gave pt calendar

## 2015-02-07 LAB — CHROMOSOME ANALYSIS, BONE MARROW

## 2015-02-07 LAB — TISSUE HYBRIDIZATION (BONE MARROW)-NCBH

## 2015-02-14 ENCOUNTER — Encounter (HOSPITAL_COMMUNITY): Payer: Self-pay

## 2015-02-26 ENCOUNTER — Encounter: Payer: Self-pay | Admitting: Internal Medicine

## 2015-02-26 ENCOUNTER — Ambulatory Visit (HOSPITAL_BASED_OUTPATIENT_CLINIC_OR_DEPARTMENT_OTHER): Payer: Medicare Other | Admitting: Internal Medicine

## 2015-02-26 VITALS — BP 164/67 | HR 46 | Temp 98.4°F | Resp 18 | Ht 67.0 in | Wt 175.5 lb

## 2015-02-26 DIAGNOSIS — D472 Monoclonal gammopathy: Secondary | ICD-10-CM

## 2015-02-26 DIAGNOSIS — I10 Essential (primary) hypertension: Secondary | ICD-10-CM | POA: Insufficient documentation

## 2015-02-26 NOTE — Progress Notes (Signed)
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  PRIOR THERAPY: None  CURRENT THERAPY: None  INTERVAL HISTORY: Bryan Lamb 69 y.o. male returns to the clinic today for follow-up visit accompanied by his wife. The patient is feeling fine today with no specific complaints. He recently underwent several studies to rule out multiple myeloma including quantitative immunoglobulin in addition to serum light chain as well as bone marrow biopsy and aspirate. He is here today for evaluation and discussion of his lab results. He denied having any significant weight loss or night sweats. He denied having any chest pain, shortness of breath, cough or hemoptysis. He has no nausea or vomiting, no fever or chills.  MEDICAL HISTORY: Past Medical History  Diagnosis Date  . Urinary incontinence   . Nonischemic cardiomyopathy   . Chronic systolic dysfunction of left ventricle   . NSVT (nonsustained ventricular tachycardia)     ALLERGIES:  has No Known Allergies.  MEDICATIONS:  Current Outpatient Prescriptions  Medication Sig Dispense Refill  . atorvastatin (LIPITOR) 80 MG tablet   1  . carvedilol (COREG) 12.5 MG tablet Take 1.5 tablets (18.75 mg total) by mouth 2 (two) times daily with a meal. 90 tablet 3  . finasteride (PROSCAR) 5 MG tablet Take 5 mg by mouth daily.  0  . furosemide (LASIX) 40 MG tablet Take 1 tablet (40 mg total) by mouth daily. 30 tablet 11  . isosorbide-hydrALAZINE (BIDIL) 20-37.5 MG per tablet Take 1 tablet by mouth 3 (three) times daily. 90 tablet 3  . nitroGLYCERIN (NITROSTAT) 0.4 MG SL tablet Place 1 tablet (0.4 mg total) under the tongue every 5 (five) minutes x 3 doses as needed for chest pain. 25 tablet 12  . spironolactone (ALDACTONE) 25 MG tablet Take 1 tablet (25 mg total) by mouth daily. 30 tablet 11  . warfarin (COUMADIN) 7.5  MG tablet Take 1 tablet (7.5 mg total) by mouth one time only at 6 PM. Take as directed, start with 1 and 1/2 tablets daily. (Patient taking differently: Take 7.5 mg by mouth one time only at 6 PM. Take 1 tablet daily, except take 1/2 tablet on Tuesday, Thursday, Saturday.) 60 tablet 3  . sacubitril-valsartan (ENTRESTO) 24-26 MG Take 1 tablet by mouth 2 (two) times daily. (Patient not taking: Reported on 01/29/2015) 60 tablet 3   No current facility-administered medications for this visit.    SURGICAL HISTORY:  Past Surgical History  Procedure Laterality Date  . Knee surgery    . Ankle fracture surgery    . Left heart catheterization with coronary angiogram N/A 09/04/2014    Procedure: LEFT HEART CATHETERIZATION WITH CORONARY ANGIOGRAM;  Surgeon: Sanda Klein, MD;  Location: [facility]ATH [facility];  Service: Cardiovascular;  Laterality: N/A;    REVIEW OF SYSTEMS:  Constitutional: negative Eyes: negative Ears, nose, mouth, throat, and face: negative Respiratory: negative Cardiovascular: negative Gastrointestinal: negative Genitourinary:negative Integument/breast: negative Hematologic/lymphatic: negative Musculoskeletal:negative Neurological: negative Behavioral/Psych: negative Endocrine: negative Allergic/Immunologic: negative   PHYSICAL EXAMINATION: General appearance: alert, cooperative and no distress Head: Normocephalic, without obvious abnormality, atraumatic Neck: no adenopathy, no JVD, supple, symmetrical, trachea midline and thyroid not enlarged, symmetric, no tenderness/mass/nodules Lymph nodes: Cervical, supraclavicular, and axillary nodes normal. Resp: clear to auscultation bilaterally Back: symmetric, no curvature. ROM normal. No CVA tenderness. Cardio: regular rate and rhythm, S1, S2 normal, no murmur, click, rub or gallop GI:  soft, non-tender; bowel sounds normal; no masses,  no organomegaly Extremities: extremities normal, atraumatic, no cyanosis or edema Neurologic: Alert  and oriented X 3, normal strength and tone. Normal symmetric reflexes. Normal coordination and gait  ECOG PERFORMANCE STATUS: 0 - Asymptomatic  Blood pressure 164/67, pulse 46, temperature 98.4 F (36.9 C), temperature source Oral, resp. rate 18, height $RemoveBe'5\' 7"'BwmvkpUFU$  (1.702 m), weight 175 lb 8 oz (79.606 kg), SpO2 100 %.  LABORATORY DATA: Lab Results  Component Value Date   WBC 3.3* 01/29/2015   HGB 10.2* 01/29/2015   HCT 31.3* 01/29/2015   MCV 91.3 01/29/2015   PLT 153 01/29/2015      Chemistry      Component Value Date/Time   NA 141 01/14/2015 1243   NA 140 12/21/2014 0900   K 4.0 01/14/2015 1243   K 4.0 12/21/2014 0900   CL 109 12/21/2014 0900   CO2 26 01/14/2015 1243   CO2 27 12/21/2014 0900   BUN 16.4 01/14/2015 1243   BUN 20 12/21/2014 0900   CREATININE 1.2 01/14/2015 1243   CREATININE 1.22 12/21/2014 0900      Component Value Date/Time   CALCIUM 9.3 01/14/2015 1243   CALCIUM 8.8* 12/21/2014 0900   ALKPHOS 70 01/14/2015 1243   ALKPHOS 83 10/08/2014 1020   AST 17 01/14/2015 1243   AST 24 10/08/2014 1020   ALT 16 01/14/2015 1243   ALT 26 10/08/2014 1020   BILITOT 0.82 01/14/2015 1243   BILITOT 0.8 10/08/2014 1020       RADIOGRAPHIC STUDIES: Ct Biopsy  01/29/2015   CLINICAL DATA:  69 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 He has been referred for bone marrow biopsy.  EXAM: CT-GUIDED BIOPSY CORE BIOPSY OF BONE MARROW  MEDICATIONS AND MEDICAL HISTORY: Versed 2.0 mg, Fentanyl 50 mcg.  Additional Medications: 0.  ANESTHESIA/SEDATION: Moderate sedation time: 7 minutes  PROCEDURE: The procedure risks, benefits, and alternatives were explained to the patient. Questions regarding the procedure were encouraged and answered. The patient understands and consents to the procedure.  Scout CT of the pelvis was performed for surgical planning purposes.  The posterior pelvis was prepped with Betadinein a sterile fashion, and a sterile drape was applied covering the  operative field. A sterile gown and sterile gloves were used for the procedure. Local anesthesia was provided with 1% Lidocaine.  We targeted the right posterior iliac bone for biopsy. The skin and subcutaneous tissues were infiltrated with 1% lidocaine without epinephrine. A small stab incision was made with an 11 blade scalpel, and an 11 gauge Murphy needle was advanced with CT guidance to the posterior cortex. Manual forced was used to advance the needle through the posterior cortex and the stylet was removed. A bone marrow aspirate was retrieved and passed to a cytotechnologist in the room. The Murphy needle was then advanced without the stylet for a core biopsy. The core biopsy was retrieved and also passed to a cytotechnologist.  Manual pressure was used for hemostasis and a sterile dressing was placed.  No complications were encountered no significant blood loss was encountered.  Patient tolerated the procedure well and remained hemodynamically stable throughout.  Marland Kitchen  FINDINGS: Scout image demonstrates safe approach to posterior iliac bone.  Images during the case demonstrate placement of 11 gauge Murphy needle  COMPLICATIONS: None  IMPRESSION: Status post CT-guided bone marrow biopsy, with tissue specimen sent to pathology for complete histopathologic analysis  Signed,  Dulcy Fanny. Earleen Newport DO  [facility]  [facility]   Electronically Signed   By: Corrie Mckusick D.O.   On: 01/29/2015 11:08   Patient: DELANO, FRATE Collected: 01/29/2015 Client: [facility] Accession: [id number] Received: 01/29/2015 Corrie Mckusick, DO DOB: 1946-06-21 Age: 62 Gender: M Reported: 02/07/15 [address] Patient Ph: [phone] MRN #: [id number] [place], [place] [address] Visit #: [id number] Chart #: Phone: [phone] Fax: CC: Curt Bears, MD BONE MARROW REPORT FINAL DIAGNOSIS Diagnosis Bone Marrow, Aspirate,Biopsy, and Clot - SLIGHTLY HYPERCELLULAR BONE  MARROW FOR AGE WITH TRILINEAGE HEMATOPOIESIS AND 3% PLASMA CELLS. - SEE COMMENT. PERIPHERAL BLOOD: - NORMOCYTIC-NORMOCHROMIC ANEMIA. - LEUKOPENIA. Diagnosis Note The bone marrow is slightly hypercellular for age with trilineage hematopoiesis and nonspecific changes. The plasma cells represent 3% of all cells with lack of large aggregates or sheets. Immunohistochemical stains show polyclonal staining pattern for kappa and lambda light chains in plasma cells. The findings are considered nonspecific and not diagnostic of plasma cell dyscrasia/neoplasm. No amyloid deposits are seen with Congo red stain. Correlation with cytogenetic studies is recommended. (BNS:ecj 2015/02/07) Susanne Greenhouse MD Pathologist, Electronic Signature (Case signed 02/07/2015)  ASSESSMENT AND PLAN: This is a very pleasant 69 years old African-American male who was referred to me by Dr. Aundra Dubin for evaluation and to rule out multiple myeloma. The patient underwent several studies recently including bone marrow biopsy and aspirate that showed only 3% plasma cells with no specific findings concerning for plasma cell dyscrasia. Also the Congo red stains did not show any amyloid deposits.  The myeloma panel showed no concerning findings for monoclonal gammopathy or multiple myeloma. I discussed the lab and biopsy results with the patient and his wife today. I recommended for him to continue his routine follow-up visit with his primary care physician. For hypertension, I strongly advised the patient to take his blood pressure medication at regular basis and to reconsult with his primary care physician and cardiologist regarding adjustment of his medication. I will discharge the patient from the clinic and would be happy to see him in the future if needed. The patient voices understanding of current disease status and treatment options and is in agreement with the current care plan.  All questions were answered. The patient knows to  call the clinic with any problems, questions or concerns. We can certainly see the patient much sooner if necessary.  Disclaimer: This note was dictated with voice recognition software. Similar sounding words can inadvertently be transcribed and may not be corrected upon review.

## 2015-02-28 ENCOUNTER — Ambulatory Visit (INDEPENDENT_AMBULATORY_CARE_PROVIDER_SITE_OTHER): Payer: Medicare Other | Admitting: *Deleted

## 2015-02-28 DIAGNOSIS — R7989 Other specified abnormal findings of blood chemistry: Secondary | ICD-10-CM | POA: Diagnosis not present

## 2015-02-28 DIAGNOSIS — Z5181 Encounter for therapeutic drug level monitoring: Secondary | ICD-10-CM | POA: Diagnosis not present

## 2015-02-28 DIAGNOSIS — R778 Other specified abnormalities of plasma proteins: Secondary | ICD-10-CM

## 2015-02-28 DIAGNOSIS — I409 Acute myocarditis, unspecified: Secondary | ICD-10-CM

## 2015-02-28 LAB — POCT INR: INR: 3.1

## 2015-03-12 ENCOUNTER — Other Ambulatory Visit (HOSPITAL_COMMUNITY): Payer: Self-pay | Admitting: Cardiology

## 2015-03-28 ENCOUNTER — Ambulatory Visit (INDEPENDENT_AMBULATORY_CARE_PROVIDER_SITE_OTHER): Payer: Medicare Other | Admitting: *Deleted

## 2015-03-28 DIAGNOSIS — I409 Acute myocarditis, unspecified: Secondary | ICD-10-CM | POA: Diagnosis not present

## 2015-03-28 DIAGNOSIS — Z5181 Encounter for therapeutic drug level monitoring: Secondary | ICD-10-CM

## 2015-03-28 DIAGNOSIS — R7989 Other specified abnormal findings of blood chemistry: Secondary | ICD-10-CM | POA: Diagnosis not present

## 2015-03-28 DIAGNOSIS — R778 Other specified abnormalities of plasma proteins: Secondary | ICD-10-CM

## 2015-03-28 LAB — POCT INR: INR: 2.9

## 2015-04-08 ENCOUNTER — Encounter: Payer: Self-pay | Admitting: Internal Medicine

## 2015-04-08 ENCOUNTER — Ambulatory Visit (INDEPENDENT_AMBULATORY_CARE_PROVIDER_SITE_OTHER): Payer: Medicare Other | Admitting: Internal Medicine

## 2015-04-08 VITALS — BP 130/74 | HR 62 | Temp 97.7°F | Resp 16 | Ht 67.0 in | Wt 174.0 lb

## 2015-04-08 DIAGNOSIS — E0590 Thyrotoxicosis, unspecified without thyrotoxic crisis or storm: Secondary | ICD-10-CM

## 2015-04-08 LAB — T4, FREE: Free T4: 1.12 ng/dL (ref 0.60–1.60)

## 2015-04-08 LAB — TSH: TSH: 0.27 u[IU]/mL — ABNORMAL LOW (ref 0.35–4.50)

## 2015-04-08 LAB — T3, FREE: T3, Free: 3.2 pg/mL (ref 2.3–4.2)

## 2015-04-08 NOTE — Patient Instructions (Signed)
Please stop at the lab.  Think about having a thyroid ultrasound.  Let me know if you feel the following symptoms:  Hyperthyroidism The thyroid is a large gland located in the lower front part of your neck. The thyroid helps control metabolism. Metabolism is how your body uses food. It controls metabolism with the hormone thyroxine. When the thyroid is overactive, it produces too much hormone. When this happens, these following problems may occur:   Nervousness  Heat intolerance  Weight loss (in spite of increase food intake)  Diarrhea  Change in hair or skin texture  Palpitations (heart skipping or having extra beats)  Tachycardia (rapid heart rate)  Loss of menstruation (amenorrhea)  Shaking of the hands CAUSES  Grave's Disease (the immune system attacks the thyroid gland). This is the most common cause.  Inflammation of the thyroid gland.  Tumor (usually benign) in the thyroid gland or elsewhere.  Excessive use of thyroid medications (both prescription and 'natural').  Excessive ingestion of Iodine. DIAGNOSIS  To prove hyperthyroidism, your caregiver may do blood tests and ultrasound tests. Sometimes the signs are hidden. It may be necessary for your caregiver to watch this illness with blood tests, either before or after diagnosis and treatment. TREATMENT Short-term treatment There are several treatments to control symptoms. Drugs called beta blockers may give some relief. Drugs that decrease hormone production will provide temporary relief in many people. These measures will usually not give permanent relief. Definitive therapy There are treatments available which can be discussed between you and your caregiver which will permanently treat the problem. These treatments range from surgery (removal of the thyroid), to the use of radioactive iodine (destroys the thyroid by radiation), to the use of antithyroid drugs (interfere with hormone synthesis). The first two  treatments are permanent and usually successful. They most often require hormone replacement therapy for life. This is because it is impossible to remove or destroy the exact amount of thyroid required to make a person euthyroid (normal). HOME CARE INSTRUCTIONS  See your caregiver if the problems you are being treated for get worse. Examples of this would be the problems listed above. SEEK MEDICAL CARE IF: Your general condition worsens. MAKE SURE YOU:   Understand these instructions.  Will watch your condition.  Will get help right away if you are not doing well or get worse. Document Released: 06/22/2005 Document Revised: 09/14/2011 Document Reviewed: 11/03/2006 [employer] [facility] Patient Information 2015 [place], [place]. This information is not intended to replace advice given to you by your health care provider. Make sure you discuss any questions you have with your health care provider.

## 2015-04-08 NOTE — Progress Notes (Signed)
Patient ID: Bryan Lamb, male   DOB: 1946/03/26, 69 y.o.   MRN: [id number]   HPI  Bryan Lamb is a 69 y.o.-year-old male, initially referred by his cardiologist, Dr. Aundra Dubin, returning for f/u for subclinical thyrotoxicosis. Last visit 4 mo ago.  Reviewed and addended history: His low TSH was discovered during investigation for heart ds. Pt has a AMI in 09/03/2014. 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 He had a 2D Echo on 12/06/2014:  - Moderately dilated LV with EF 25% (prev. 20% in 09/2014), diffuse hypokinesis. No LVthrombus. Normal RV size with mildly decreased systolic function. No significant valvular abnormalities.  He had an appointment with cardiology in June and at that visit, he refused an ICD placement. He was advised to wear his external defibrillator (vest), however, he is now wearing this.  He is on Carvedilol 18.75 mg bid.  I reviewed pt's thyroid tests: Lab Results  Component Value Date   TSH 0.15* 12/06/2014   TSH 0.187* 10/08/2014   TSH 0.060* 09/04/2014   FREET4 1.16 12/06/2014   FREET4 1.05 10/08/2014    TSI     <140 % baseline 36   At last visit, since his TSH was lower than before, I suggested an uptake and scan. He had a thyroid uptake and scan on 01/10/2015: 24 hour radio iodine uptake calculated at 9%, slightly below the normal range.  Imaging of the thyroid gland demonstrates poor tracer localization within the thyroid lobes, with a few small areas of tracer localization seen with additional areas of decreased tracer localization.  Findings suggest presence of an enlarged multinodular thyroid gland though localization is insufficient for adequate characterization.  Potentially the thyroid gland could extend into the superior Mediastinum.  Patient's thyroid iodine uptake  was not elevated. He has very mild toxic multinodular goiter. Since he did not have hyperthyroid symptoms,  We decided to just follow his thyroid labs for  now.  Based on the results of the uptake and scan, his goiter could extend into the superior mediastinum. He has no compression symptoms,  But I suggested a thyroid ultrasound to better characterize his goiter.   Pt denies feeling nodules in neck, hoarseness, dysphagia/odynophagia, SOB with lying down; he c/o: - no fatigue - no excessive sweating/heat intolerance - no tremors - no anxiety - no palpitations - no hyperdefecation and constipation - no weight loss/gain - no hair loss  Pt does have a FH of thyroid ds.: sister. No FH of thyroid cancer. No h/o radiation tx to head or neck.  No seaweed or kelp, no recent contrast studies. No steroid use. No herbal supplements. No Biotin use.  ROS: Constitutional: + weight gain, no fatigue, no subjective hyperthermia/hypothermia Eyes: no blurry vision, no xerophthalmia ENT: no sore throat, no nodules palpated in throat, no dysphagia/odynophagia, no hoarseness Cardiovascular: no CP/SOB/palpitations/leg swelling Respiratory: no cough/SOB Gastrointestinal: no N/V/D/C Musculoskeletal: no muscle/joint aches Skin: no rashes Neurological: no tremors/numbness/tingling/dizziness  I reviewed pt's medications, allergies, PMH, social hx, family hx, and changes were documented in the history of present illness. Otherwise, unchanged from my initial visit note.  Past Medical History  Diagnosis Date  . Urinary incontinence   . Nonischemic cardiomyopathy ([place])   . Chronic systolic dysfunction of left ventricle   . NSVT (nonsustained ventricular tachycardia) [facility])    Past Surgical History  Procedure Laterality Date  . Knee surgery    . Ankle fracture surgery    . Left heart catheterization with coronary angiogram N/A 09/04/2014  Procedure: LEFT HEART CATHETERIZATION WITH CORONARY ANGIOGRAM;  Surgeon: Sanda Klein, MD;  Location: [facility] CATH LAB;  Service: Cardiovascular;  Laterality: N/A;   History   Social History  . Marital Status: Married     Spouse Name: N/A  . Number of Children: N/A   Occupational History  . N/a   Social History Main Topics  . Smoking status: Light Tobacco Smoker  . Smokeless tobacco: Not on file  . Alcohol Use: No  . Drug Use: No   Current Outpatient Prescriptions on File Prior to Visit  Medication Sig Dispense Refill  . atorvastatin (LIPITOR) 80 MG tablet   1  . BIDIL 20-37.5 MG per tablet take 1 tablet by mouth three times a day 90 tablet 3  . carvedilol (COREG) 12.5 MG tablet Take 1.5 tablets (18.75 mg total) by mouth 2 (two) times daily with a meal. 90 tablet 3  . finasteride (PROSCAR) 5 MG tablet Take 5 mg by mouth daily.  0  . furosemide (LASIX) 40 MG tablet Take 1 tablet (40 mg total) by mouth daily. 30 tablet 11  . nitroGLYCERIN (NITROSTAT) 0.4 MG SL tablet Place 1 tablet (0.4 mg total) under the tongue every 5 (five) minutes x 3 doses as needed for chest pain. 25 tablet 12  . spironolactone (ALDACTONE) 25 MG tablet Take 1 tablet (25 mg total) by mouth daily. 30 tablet 11  . warfarin (COUMADIN) 7.5 MG tablet Take 1 tablet (7.5 mg total) by mouth one time only at 6 PM. Take as directed, start with 1 and 1/2 tablets daily. (Patient taking differently: Take 7.5 mg by mouth one time only at 6 PM. Take 1 tablet daily, except take 1/2 tablet on Tuesday, Thursday, Saturday.) 60 tablet 3  . sacubitril-valsartan (ENTRESTO) 24-26 MG Take 1 tablet by mouth 2 (two) times daily. (Patient not taking: Reported on 01/29/2015) 60 tablet 3   No current facility-administered medications on file prior to visit.   No Known Allergies   FH: - see HPI  PE: BP 130/74 mmHg  Pulse 62  Temp(Src) 97.7 F (36.5 C) (Oral)  Resp 16  Ht 5\' 7"  (1.702 m)  Wt 174 lb (78.926 kg)  BMI 27.25 kg/m2  SpO2 98% Wt Readings from Last 3 Encounters:  04/08/15 174 lb (78.926 kg)  02/26/15 175 lb 8 oz (79.606 kg)  01/29/15 165 lb (74.844 kg)   Constitutional: normal weight, in NAD Eyes: PERRLA, EOMI, slight B exophthalmos, no  lid lag, no stare ENT: moist mucous membranes, no thyromegaly, no cervical lymphadenopathy Cardiovascular: bradycardia, RR, No MRG Respiratory: CTA B Gastrointestinal: abdomen soft, NT, ND, BS+ Musculoskeletal: no deformities, strength intact in all 4 Skin: moist, warm, no rashes Neurological: no tremor with outstretched hands, DTR normal in all 4  ASSESSMENT: 1. Subclinical Thyrotoxicosis  PLAN:  1. Patient with Subclinical Thyrotoxicosis, with normal free T4 and free T3 and without thyrotoxic sxs: weight loss, heat intolerance, hyperdefecation, palpitations, anxiety.  He does have significant CHF, with an EF of 25% , and was recommended an ICD, which he refused... We need to try to get his thyroid tests under control, as hyperthyroidism may influence his heart function. However, at last 2 checks, his TSH was mildly decreased and his T4 and T3 were normal (mild toxic multinodular goiter), therefore, we elected just to follow his thyroid tests and not intervene yet. -  We will check TSH, fT3 and fT4  Today, and, depending on the results, we may need radioactive iodine treatment, low-dose  methimazole, or just follow the labs. -  We discussed about possible symptoms of hyperthyroidism and gave him a list (see patient instructions) >>  Advised him to call me if he develops these before next visit. -  As his thyroid may descend into the upper mediastinum per his  Thyroid scan report,  I discussed with him and his wife today to get a thyroid ultrasound to further investigate this. He is undecided today, and I advised him to think about this and let me know if  He would like to have this done. - he is on beta blocker: carvedilol (per cardiology) - I advised him to join my chart to communicate easier - refused - RTC in 4 months, but possibly sooner for repeat labs   Office Visit on 04/08/2015  Component Date Value Ref Range Status  . TSH 04/08/2015 0.27* 0.35 - 4.50 uIU/mL Final  . Free T4  04/08/2015 1.12  0.60 - 1.60 ng/dL Final  . T3, Free 04/08/2015 3.2  2.3 - 4.2 pg/mL Final   TFTs close to normal. Will recheck at next visit but no intervention needed for now.

## 2015-04-25 ENCOUNTER — Ambulatory Visit (INDEPENDENT_AMBULATORY_CARE_PROVIDER_SITE_OTHER): Payer: Medicare Other

## 2015-04-25 DIAGNOSIS — Z5181 Encounter for therapeutic drug level monitoring: Secondary | ICD-10-CM | POA: Diagnosis not present

## 2015-04-25 DIAGNOSIS — R778 Other specified abnormalities of plasma proteins: Secondary | ICD-10-CM

## 2015-04-25 DIAGNOSIS — R7989 Other specified abnormal findings of blood chemistry: Secondary | ICD-10-CM | POA: Diagnosis not present

## 2015-04-25 DIAGNOSIS — I409 Acute myocarditis, unspecified: Secondary | ICD-10-CM | POA: Diagnosis not present

## 2015-04-25 LAB — POCT INR: INR: 5.1

## 2015-05-06 ENCOUNTER — Ambulatory Visit (INDEPENDENT_AMBULATORY_CARE_PROVIDER_SITE_OTHER): Payer: Medicare Other | Admitting: *Deleted

## 2015-05-06 DIAGNOSIS — R7989 Other specified abnormal findings of blood chemistry: Secondary | ICD-10-CM | POA: Diagnosis not present

## 2015-05-06 DIAGNOSIS — R778 Other specified abnormalities of plasma proteins: Secondary | ICD-10-CM

## 2015-05-06 DIAGNOSIS — I409 Acute myocarditis, unspecified: Secondary | ICD-10-CM

## 2015-05-06 DIAGNOSIS — Z5181 Encounter for therapeutic drug level monitoring: Secondary | ICD-10-CM

## 2015-05-06 LAB — POCT INR: INR: 2.1

## 2015-05-17 ENCOUNTER — Other Ambulatory Visit (HOSPITAL_COMMUNITY): Payer: Self-pay | Admitting: *Deleted

## 2015-05-17 MED ORDER — CARVEDILOL 12.5 MG PO TABS: 18.7500 mg | ORAL_TABLET | Freq: Two times a day (BID) | ORAL | Status: DC

## 2015-05-20 ENCOUNTER — Ambulatory Visit (INDEPENDENT_AMBULATORY_CARE_PROVIDER_SITE_OTHER): Payer: Medicare Other | Admitting: *Deleted

## 2015-05-20 DIAGNOSIS — I409 Acute myocarditis, unspecified: Secondary | ICD-10-CM

## 2015-05-20 DIAGNOSIS — Z5181 Encounter for therapeutic drug level monitoring: Secondary | ICD-10-CM

## 2015-05-20 DIAGNOSIS — R7989 Other specified abnormal findings of blood chemistry: Secondary | ICD-10-CM

## 2015-05-20 DIAGNOSIS — R778 Other specified abnormalities of plasma proteins: Secondary | ICD-10-CM

## 2015-05-20 LAB — POCT INR: INR: 2.4

## 2015-06-10 ENCOUNTER — Ambulatory Visit (INDEPENDENT_AMBULATORY_CARE_PROVIDER_SITE_OTHER): Payer: Medicare Other | Admitting: Pharmacist

## 2015-06-10 DIAGNOSIS — I409 Acute myocarditis, unspecified: Secondary | ICD-10-CM

## 2015-06-10 DIAGNOSIS — R7989 Other specified abnormal findings of blood chemistry: Secondary | ICD-10-CM | POA: Diagnosis not present

## 2015-06-10 DIAGNOSIS — R778 Other specified abnormalities of plasma proteins: Secondary | ICD-10-CM

## 2015-06-10 DIAGNOSIS — Z5181 Encounter for therapeutic drug level monitoring: Secondary | ICD-10-CM

## 2015-06-10 LAB — POCT INR: INR: 2.4

## 2015-07-15 ENCOUNTER — Ambulatory Visit (INDEPENDENT_AMBULATORY_CARE_PROVIDER_SITE_OTHER): Payer: Medicare Other

## 2015-07-15 DIAGNOSIS — I409 Acute myocarditis, unspecified: Secondary | ICD-10-CM | POA: Diagnosis not present

## 2015-07-15 DIAGNOSIS — Z5181 Encounter for therapeutic drug level monitoring: Secondary | ICD-10-CM

## 2015-07-15 DIAGNOSIS — R7989 Other specified abnormal findings of blood chemistry: Secondary | ICD-10-CM

## 2015-07-15 DIAGNOSIS — R778 Other specified abnormalities of plasma proteins: Secondary | ICD-10-CM

## 2015-07-15 LAB — POCT INR: INR: 2.1

## 2015-07-17 ENCOUNTER — Other Ambulatory Visit (HOSPITAL_COMMUNITY): Payer: Self-pay | Admitting: *Deleted

## 2015-07-17 MED ORDER — ISOSORB DINITRATE-HYDRALAZINE 20-37.5 MG PO TABS: 1.0000 | ORAL_TABLET | Freq: Three times a day (TID) | ORAL | Status: DC

## 2015-07-26 ENCOUNTER — Other Ambulatory Visit: Payer: Self-pay | Admitting: Pharmacist

## 2015-07-26 ENCOUNTER — Other Ambulatory Visit (HOSPITAL_COMMUNITY): Payer: Self-pay | Admitting: *Deleted

## 2015-07-26 DIAGNOSIS — Z7901 Long term (current) use of anticoagulants: Secondary | ICD-10-CM

## 2015-07-26 MED ORDER — WARFARIN SODIUM 7.5 MG PO TABS: ORAL_TABLET | ORAL | Status: DC

## 2015-07-26 NOTE — Telephone Encounter (Signed)
Open in error refill already called in

## 2015-07-26 NOTE — Telephone Encounter (Signed)
Pt wife calling to request refill on warfarin. States pharmacy has been waiting for 4 days for refill authorization. Confirmed pharmacy and sent refill for warfarin.

## 2015-08-26 ENCOUNTER — Ambulatory Visit (INDEPENDENT_AMBULATORY_CARE_PROVIDER_SITE_OTHER): Payer: Medicare Other | Admitting: *Deleted

## 2015-08-26 DIAGNOSIS — I409 Acute myocarditis, unspecified: Secondary | ICD-10-CM

## 2015-08-26 DIAGNOSIS — Z5181 Encounter for therapeutic drug level monitoring: Secondary | ICD-10-CM

## 2015-08-26 DIAGNOSIS — R778 Other specified abnormalities of plasma proteins: Secondary | ICD-10-CM

## 2015-08-26 DIAGNOSIS — R7989 Other specified abnormal findings of blood chemistry: Secondary | ICD-10-CM | POA: Diagnosis not present

## 2015-08-26 LAB — POCT INR: INR: 3.1

## 2015-09-13 ENCOUNTER — Other Ambulatory Visit (HOSPITAL_COMMUNITY): Payer: Self-pay | Admitting: *Deleted

## 2015-09-13 DIAGNOSIS — R972 Elevated prostate specific antigen [PSA]: Secondary | ICD-10-CM | POA: Diagnosis not present

## 2015-09-13 DIAGNOSIS — N402 Nodular prostate without lower urinary tract symptoms: Secondary | ICD-10-CM | POA: Diagnosis not present

## 2015-09-13 MED ORDER — CARVEDILOL 12.5 MG PO TABS: 18.7500 mg | ORAL_TABLET | Freq: Two times a day (BID) | ORAL | Status: DC

## 2015-09-30 ENCOUNTER — Ambulatory Visit (INDEPENDENT_AMBULATORY_CARE_PROVIDER_SITE_OTHER): Payer: Medicare Other | Admitting: *Deleted

## 2015-09-30 ENCOUNTER — Telehealth: Payer: Self-pay | Admitting: *Deleted

## 2015-09-30 DIAGNOSIS — Z5181 Encounter for therapeutic drug level monitoring: Secondary | ICD-10-CM | POA: Diagnosis not present

## 2015-09-30 DIAGNOSIS — I409 Acute myocarditis, unspecified: Secondary | ICD-10-CM

## 2015-09-30 DIAGNOSIS — R7989 Other specified abnormal findings of blood chemistry: Secondary | ICD-10-CM

## 2015-09-30 DIAGNOSIS — R778 Other specified abnormalities of plasma proteins: Secondary | ICD-10-CM

## 2015-09-30 LAB — POCT INR: INR: 2

## 2015-09-30 NOTE — Telephone Encounter (Signed)
Noted.  These should not affect his INR.

## 2015-09-30 NOTE — Telephone Encounter (Signed)
Patients wife called and stated that the patient was seen in the coumadin clinic earlier today and was asked to call back and let the coumadin clinic know which medications that he was no longer taking. She reports that he is no longer taking the furosemide and spironolactone. I have removed these medications from the patients list.

## 2015-10-09 ENCOUNTER — Encounter: Payer: Self-pay | Admitting: Internal Medicine

## 2015-10-09 ENCOUNTER — Ambulatory Visit (INDEPENDENT_AMBULATORY_CARE_PROVIDER_SITE_OTHER): Payer: Medicare Other | Admitting: Internal Medicine

## 2015-10-09 VITALS — BP 110/60 | HR 68 | Temp 98.0°F | Resp 12 | Wt 179.0 lb

## 2015-10-09 DIAGNOSIS — E0520 Thyrotoxicosis with toxic multinodular goiter without thyrotoxic crisis or storm: Secondary | ICD-10-CM

## 2015-10-09 LAB — T4, FREE: FREE T4: 1.17 ng/dL (ref 0.60–1.60)

## 2015-10-09 LAB — T3, FREE: T3 FREE: 3.4 pg/mL (ref 2.3–4.2)

## 2015-10-09 LAB — TSH: TSH: 0.2 u[IU]/mL — ABNORMAL LOW (ref 0.35–4.50)

## 2015-10-09 NOTE — Progress Notes (Signed)
Patient ID: Bryan Lamb, male   DOB: 02-Jul-1946, 70 y.o.   MRN: [id number]   HPI  Bryan Lamb is a 70 y.o.-year-old male, initially referred by his cardiologist, Dr. 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 Last visit 6 mo ago.  Reviewed and addended history: His low TSH was discovered during investigation for heart ds.  Pt has a AMI in 09/03/2014. 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 He is followed by Dr. Rayann Heman.  He had a 2D Echo on 12/06/2014:  - Moderately dilated LV with EF 25% (prev. 20% in 09/2014), diffuse hypokinesis. No LVthrombus. Normal RV size with mildly decreased systolic function. No significant valvular abnormalities.  He saw cardiology in 12/2014 and at that visit, he refused an ICD placement. He was advised to wear his external defibrillator (vest), now stopped using it. He is on Carvedilol 18.75 mg bid.  He denies SOB, CP, palpitations, syncope.  I reviewed pt's thyroid tests: Lab Results  Component Value Date   TSH 0.27* 04/08/2015   TSH 0.15* 12/06/2014   TSH 0.187* 10/08/2014   TSH 0.060* 09/04/2014   FREET4 1.12 04/08/2015   FREET4 1.16 12/06/2014   FREET4 1.05 10/08/2014    TSI     <140 % baseline 36   He had a thyroid uptake and scan on 01/10/2015: 24 hour radio iodine uptake calculated at 9%, slightly below the normal range.  Imaging of the thyroid gland demonstrates poor tracer localization within the thyroid lobes, with a few small areas of tracer localization seen with additional areas of decreased tracer localization.  Findings suggest presence of an enlarged multinodular thyroid gland though localization is insufficient for adequate characterization.  Potentially the thyroid gland could extend into the superior Mediastinum.  Patient's thyroid iodine uptake  was not elevated. He has very mild toxic multinodular goiter. Since he did not have hyperthyroid symptoms,  We decided to  just follow his thyroid labs for now.  Based on the results of the uptake and scan, his goiter could extend into the superior mediastinum. He has no compression symptoms,  But I suggested a thyroid ultrasound to better characterize his goiter. He did not go for the U/S.  Pt denies feeling nodules in neck, hoarseness, dysphagia/odynophagia, SOB with lying down; he c/o: - no fatigue - no excessive sweating/heat intolerance - no tremors - no anxiety - no palpitations - no hyperdefecation and constipation - no weight loss/gain - no hair loss  Pt does have a FH of thyroid ds.: sister. No FH of thyroid cancer. No h/o radiation tx to head or neck.  No seaweed or kelp, no recent contrast studies. No steroid use. No herbal supplements. No Biotin use.  ROS: Constitutional: no weight gain, no fatigue, no subjective hyperthermia/hypothermia Eyes: no blurry vision, no xerophthalmia ENT: no sore throat, no nodules palpated in throat, no dysphagia/odynophagia, no hoarseness Cardiovascular: no CP/SOB/palpitations/leg swelling Respiratory: no cough/SOB Gastrointestinal: no N/V/D/C Musculoskeletal: no muscle/joint aches Skin: no rashes Neurological: no tremors/numbness/tingling/dizziness  I reviewed pt's medications, allergies, PMH, social hx, family hx, and changes were documented in the history of present illness. Otherwise, unchanged from my initial visit note.  Past Medical History  Diagnosis Date  . Urinary incontinence   . Nonischemic cardiomyopathy ([place])   . Chronic systolic dysfunction of left ventricle   . NSVT (nonsustained ventricular tachycardia) [facility])    Past Surgical History  Procedure Laterality Date  . Knee surgery    . Ankle fracture  surgery    . Left heart catheterization with coronary angiogram N/A 09/04/2014    Procedure: LEFT HEART CATHETERIZATION WITH CORONARY ANGIOGRAM;  Surgeon: Sanda Klein, MD;  Location: [place] CATH [facility];  Service: Cardiovascular;  Laterality: N/A;    History   Social History  . Marital Status: Married    Spouse Name: N/A  . Number of Children: N/A   Occupational History  . N/a   Social History Main Topics  . Smoking status: Light Tobacco Smoker  . Smokeless tobacco: Not on file  . Alcohol Use: No  . Drug Use: No   Current Outpatient Prescriptions on File Prior to Visit  Medication Sig Dispense Refill  . atorvastatin (LIPITOR) 80 MG tablet   1  . carvedilol (COREG) 12.5 MG tablet Take 1.5 tablets (18.75 mg total) by mouth 2 (two) times daily with a meal. 90 tablet 3  . finasteride (PROSCAR) 5 MG tablet Take 5 mg by mouth daily.  0  . isosorbide-hydrALAZINE (BIDIL) 20-37.5 MG tablet Take 1 tablet by mouth 3 (three) times daily. 90 tablet 3  . nitroGLYCERIN (NITROSTAT) 0.4 MG SL tablet Place 1 tablet (0.4 mg total) under the tongue every 5 (five) minutes x 3 doses as needed for chest pain. 25 tablet 12  . sacubitril-valsartan (ENTRESTO) 24-26 MG Take 1 tablet by mouth 2 (two) times daily. 60 tablet 3  . warfarin (COUMADIN) 7.5 MG tablet Take as directed by coumadin clinic. 30 tablet 3   No current facility-administered medications on file prior to visit.   No Known Allergies   FH: - see HPI  PE: BP 110/60 mmHg  Pulse 68  Temp(Src) 98 F (36.7 C) (Oral)  Resp 12  Wt 179 lb (81.194 kg)  SpO2 95% Body mass index is 28.03 kg/(m^2). Wt Readings from Last 3 Encounters:  10/09/15 179 lb (81.194 kg)  04/08/15 174 lb (78.926 kg)  02/26/15 175 lb 8 oz (79.606 kg)   Constitutional: normal weight, in NAD Eyes: PERRLA, EOMI, slight B exophthalmos, no lid lag, no stare ENT: moist mucous membranes, L thyroid fullness, no cervical lymphadenopathy Cardiovascular: RRR, No MRG Respiratory: CTA B Gastrointestinal: abdomen soft, NT, ND, BS+ Musculoskeletal: no deformities, strength intact in all 4 Skin: moist, warm, no rashes Neurological: no tremor with outstretched hands, DTR normal in all 4  ASSESSMENT: 1. Mild Toxic  MNC - Subclinical Thyrotoxicosis  PLAN:  1. Patient with Subclinical Thyrotoxicosis, with normal free T4 and free T3 and without thyrotoxic sxs: weight loss, heat intolerance, hyperdefecation, palpitations, anxiety.  He does have significant CHF, with an EF of 25% , and was recommended an ICD, which he refused... We need to try to get his thyroid tests under control, as hyperthyroidism may influence his heart function. However, at last checks, his TSH was mildly decreased and improving and his T4 and T3 were normal (mild toxic multinodular goiter), therefore, we elected just to follow his thyroid tests and not intervene yet. -  We will check TSH, fT3 and fT4  Today, and, depending on the results, we may need radioactive iodine treatment, low-dose methimazole, or just follow the labs. -  We discussed about possible symptoms of hyperthyroidism >>  Advised him to call me if he develops these before next visit. -  As his thyroid may descend into the upper mediastinum per his  Thyroid scan report,  I discussed with him to get a thyroid ultrasound to further investigate this. He refuses this. We can just follow this clinically for now  especially since no SOB or dysphagia. - he is on beta blocker: carvedilol (per cardiology) - I advised him to join my chart to communicate easier - refused - RTC in 6 months, but possibly sooner for repeat labs  Office Visit on 10/09/2015  Component Date Value Ref Range Status  . Free T4 10/09/2015 1.17  0.60 - 1.60 ng/dL Final  . T3, Free 10/09/2015 3.4  2.3 - 4.2 pg/mL Final  . TSH 10/09/2015 0.20* 0.35 - 4.50 uIU/mL Final   Very mild decrease in TSH. I would suggest to start the low-dose methimazole, 5 mg daily and recheck his TFTs in 5-6 weeks. I will ask him to discuss with the Coumadin clinic about the fact that he was started on methimazole.

## 2015-10-09 NOTE — Patient Instructions (Signed)
Please stop at the lab.  Please come back for a follow-up appointment in 6 months.  

## 2015-10-10 MED ORDER — METHIMAZOLE 5 MG PO TABS: 5.0000 mg | ORAL_TABLET | Freq: Every day | ORAL | Status: DC

## 2015-10-29 ENCOUNTER — Ambulatory Visit (INDEPENDENT_AMBULATORY_CARE_PROVIDER_SITE_OTHER): Payer: Medicare Other | Admitting: *Deleted

## 2015-10-29 DIAGNOSIS — Z5181 Encounter for therapeutic drug level monitoring: Secondary | ICD-10-CM

## 2015-10-29 DIAGNOSIS — R7989 Other specified abnormal findings of blood chemistry: Secondary | ICD-10-CM | POA: Diagnosis not present

## 2015-10-29 DIAGNOSIS — I409 Acute myocarditis, unspecified: Secondary | ICD-10-CM | POA: Diagnosis not present

## 2015-10-29 DIAGNOSIS — R778 Other specified abnormalities of plasma proteins: Secondary | ICD-10-CM

## 2015-10-29 LAB — POCT INR: INR: 2.2

## 2015-11-12 ENCOUNTER — Ambulatory Visit (INDEPENDENT_AMBULATORY_CARE_PROVIDER_SITE_OTHER): Payer: Medicare Other | Admitting: *Deleted

## 2015-11-12 DIAGNOSIS — R778 Other specified abnormalities of plasma proteins: Secondary | ICD-10-CM

## 2015-11-12 DIAGNOSIS — R7989 Other specified abnormal findings of blood chemistry: Secondary | ICD-10-CM

## 2015-11-12 DIAGNOSIS — Z5181 Encounter for therapeutic drug level monitoring: Secondary | ICD-10-CM | POA: Diagnosis not present

## 2015-11-12 DIAGNOSIS — I409 Acute myocarditis, unspecified: Secondary | ICD-10-CM | POA: Diagnosis not present

## 2015-11-12 LAB — POCT INR: INR: 2.2

## 2015-12-05 ENCOUNTER — Other Ambulatory Visit (HOSPITAL_COMMUNITY): Payer: Self-pay | Admitting: *Deleted

## 2015-12-05 MED ORDER — ISOSORB DINITRATE-HYDRALAZINE 20-37.5 MG PO TABS: 1.0000 | ORAL_TABLET | Freq: Three times a day (TID) | ORAL | Status: DC

## 2015-12-10 ENCOUNTER — Ambulatory Visit (INDEPENDENT_AMBULATORY_CARE_PROVIDER_SITE_OTHER): Payer: Medicare Other

## 2015-12-10 DIAGNOSIS — R778 Other specified abnormalities of plasma proteins: Secondary | ICD-10-CM

## 2015-12-10 DIAGNOSIS — I409 Acute myocarditis, unspecified: Secondary | ICD-10-CM

## 2015-12-10 DIAGNOSIS — Z5181 Encounter for therapeutic drug level monitoring: Secondary | ICD-10-CM

## 2015-12-10 DIAGNOSIS — R7989 Other specified abnormal findings of blood chemistry: Secondary | ICD-10-CM | POA: Diagnosis not present

## 2015-12-10 LAB — POCT INR: INR: 2.1

## 2016-01-08 ENCOUNTER — Other Ambulatory Visit: Payer: Self-pay | Admitting: *Deleted

## 2016-01-08 DIAGNOSIS — Z7901 Long term (current) use of anticoagulants: Secondary | ICD-10-CM

## 2016-01-08 MED ORDER — WARFARIN SODIUM 7.5 MG PO TABS: ORAL_TABLET | ORAL | Status: DC

## 2016-01-21 ENCOUNTER — Ambulatory Visit (INDEPENDENT_AMBULATORY_CARE_PROVIDER_SITE_OTHER): Payer: Medicare Other

## 2016-01-21 DIAGNOSIS — R778 Other specified abnormalities of plasma proteins: Secondary | ICD-10-CM

## 2016-01-21 DIAGNOSIS — R7989 Other specified abnormal findings of blood chemistry: Secondary | ICD-10-CM | POA: Diagnosis not present

## 2016-01-21 DIAGNOSIS — Z5181 Encounter for therapeutic drug level monitoring: Secondary | ICD-10-CM | POA: Diagnosis not present

## 2016-01-21 DIAGNOSIS — I409 Acute myocarditis, unspecified: Secondary | ICD-10-CM

## 2016-01-21 LAB — POCT INR: INR: 2.1

## 2016-02-07 ENCOUNTER — Other Ambulatory Visit (HOSPITAL_COMMUNITY): Payer: Self-pay | Admitting: Cardiology

## 2016-02-28 IMAGING — MR MR CARD MORPHOLOGY WO/W CM
13 of 14 series · 38 of 40 positions shown · IV contrast (multihance)
Comparison: none

CLINICAL DATA: 68-year-old male with NSTEMI and normal coronaries.

EXAM:
CARDIAC MRI
TECHNIQUE: The patient was scanned on a 1.5 Tesla GE magnet. A dedicated
cardiac coil was used. Functional imaging was done using Fiesta
sequences. [DATE], and 4 chamber views were done to assess for RWMA's.
Modified Aujla rule using a short axis stack was used to
calculate an ejection fraction on a dedicated work station using
Circle software. The patient received 24 cc of Multihance. After 10
minutes inversion recovery sequences were used to assess for
infiltration and scar tissue.
CONTRAST:  24 cc of Multihance

[Series 3: bSSFP · sagittal · 8.0mm · 1.41mm/px · 1 of 18 slices shown (1 of 7)]
[im 1/18]
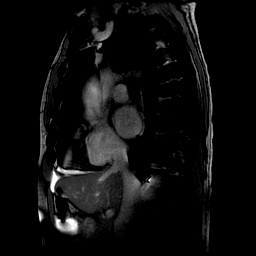

[Series 4: bSSFP · oblique · 8.0mm · 1.37mm/px · 1 of 20 slices shown (2 of 7)]
[im 1/20]
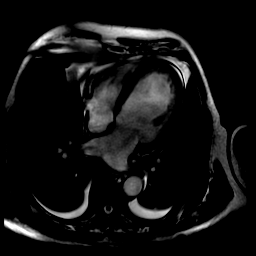

[Series 5: bSSFP · oblique · 8.0mm · 1.48mm/px · 23 of 380 slices shown (3 of 7)]
[im 1/380]
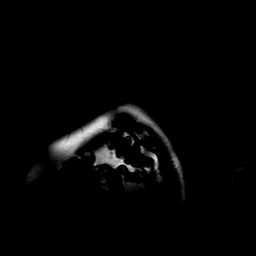
[im 18/380]
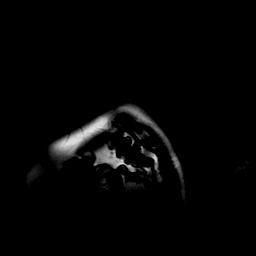
[im 35/380]
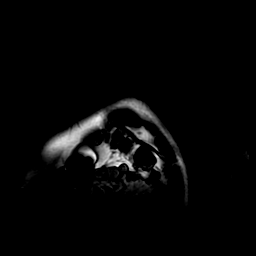
[im 52/380]
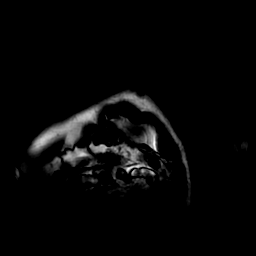
[im 69/380]
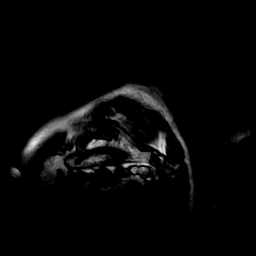
[im 87/380]
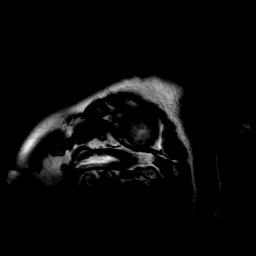
[im 104/380]
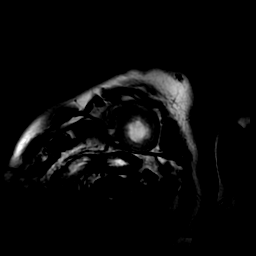
[im 121/380]
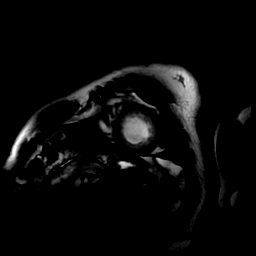
[im 138/380]
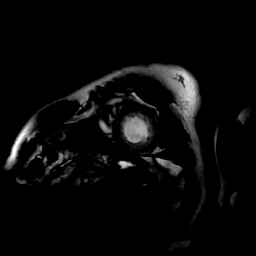
[im 156/380]
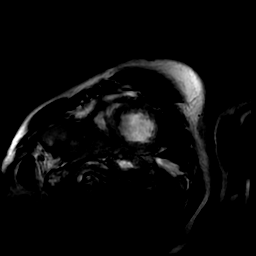
[im 173/380]
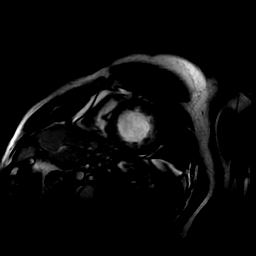
[im 190/380]
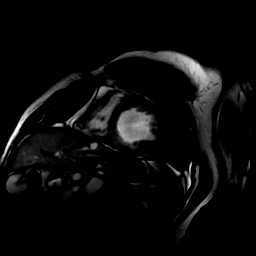
[im 207/380]
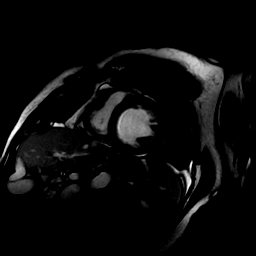
[im 224/380]
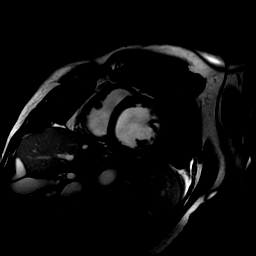
[im 242/380]
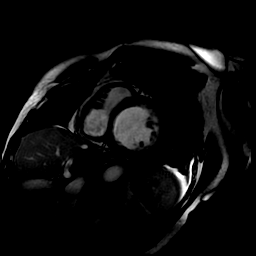
[im 259/380]
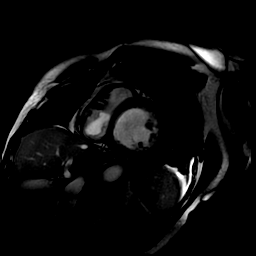
[im 276/380]
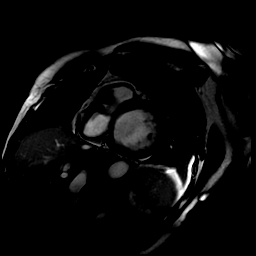
[im 293/380]
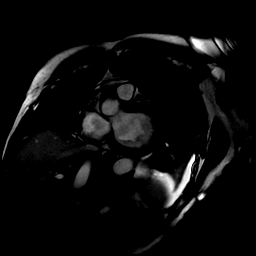
[im 311/380]
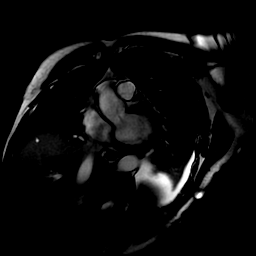
[im 328/380]
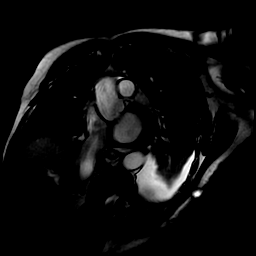
[im 345/380]
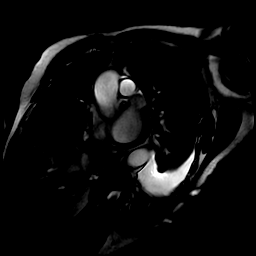
[im 362/380]
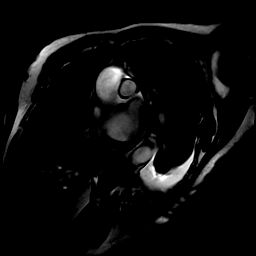
[im 380/380]
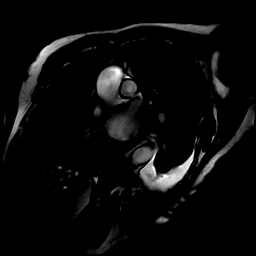

[Series 8: bSSFP · oblique · 8.0mm · 1.48mm/px · 1 of 20 slices shown (4 of 7)]
[im 1/20]
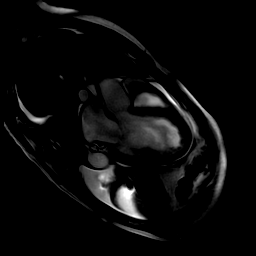

[Series 11: bSSFP · axial · 8.0mm · 1.48mm/px · z∈[-169,+24]mm · 3 of 40 slices shown (5 of 7)]
[im 1/40]
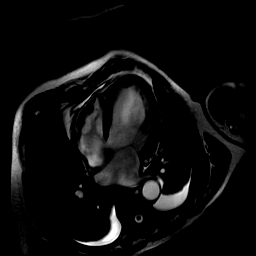
[im 20/40]
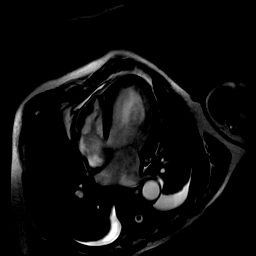
[im 40/40]
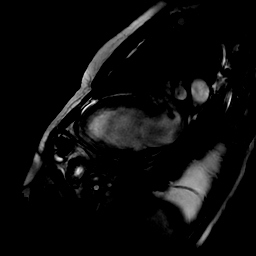

[Series 13: double ir t2fs · oblique · 12.0mm · 1.48mm/px · 1 of 13 slices shown]
[im 1/13]
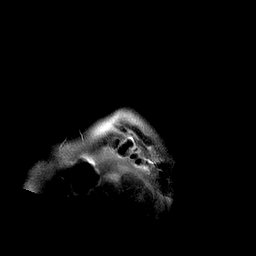

[Series 17: bSSFP · sagittal · 8.0mm · 1.41mm/px · 1 of 18 slices shown (6 of 7)]
[im 1/18]
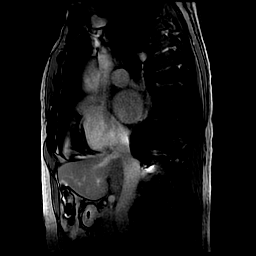

[Series 18: bSSFP · axial · 8.0mm · 1.37mm/px · 1 of 20 slices shown (7 of 7)]
[im 1/20]
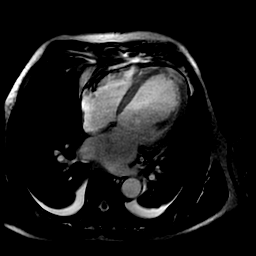

[Series 19: cine ir · axial · 10.0mm · 1.48mm/px · z∈[-71,-71]mm · 2 of 30 slices shown]
[im 1/30]
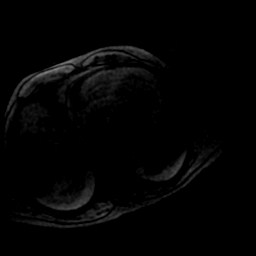
[im 30/30]
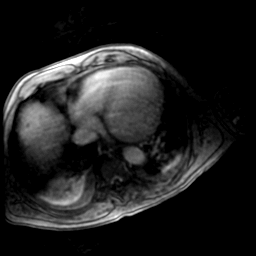

[Series 22: delayed ir prep · oblique · 8.0mm · 1.48mm/px · 1 of 16 slices shown]
[im 1/16]
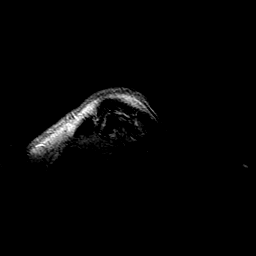

[Series 23: delayed 2ch · oblique · 8.0mm · 1.52mm/px · 1 of 7 slices shown]
[im 1/7]
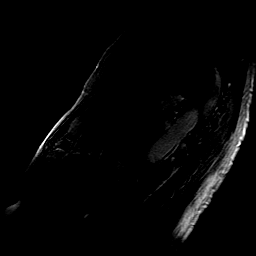

[Series 24: delayed 3ch · oblique · 8.0mm · 1.52mm/px · 1 of 3 slices shown]
[im 1/3]
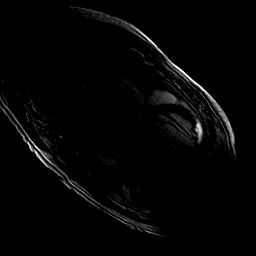

[Series 25: delayed 4ch · oblique · 8.0mm · 1.52mm/px · 1 of 3 slices shown]
[im 1/3]
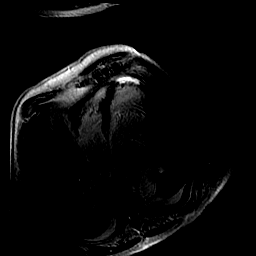

[38 of 40 positions shown; findings below may reference images not displayed]

FINDINGS: 1. Moderately dilated left ventricle with mild concentric
hypertrophy and severely decreased systolic function (LVEF = 22%).

There is diffuse hypokinesis with akinesis of the mid inferior wall,
all apical segments and paradoxical septal motion.

There is significant blood stasis in the apical segment suspicious
for an early thrombus formation.

The measurements are as follows:

LVEDD:  69 mm

LVESD:  61 mm

LVEDV:  271 ml

LVESV:  212 ml

SV:  58 ml

CO:  4.2 L/minute

Myocardial mass:  259 g

2. Normal right ventricular size with normal thickness and mildly
impaired systolic function (RVEF = 39%).

There are no regional wall motion abnormalities.

RVEDV:  114 ml

RVESV:  69 ml

SV:  46 ml

3.  Mild left atrial enlargement.

4.  Mild aortic, mitral and tricuspid regurgitation.

5. Normal caliber of the main pulmonary artery, aortic root and
thoracic aorta.

5. T2 images are showing diffuse left myocardial edema more
pronounced in the basal and mid anterolateral, mid inferior walls,
in all apical segments as well as in right ventricular myocardium.

6. There is mid-wall late gadolinium enhancement in the basal and
mid anteroseptal, inferoseptal, mid anterolateral walls, and the
entire RV free wall.

There is transmural enhancement in the mid inferior, all apical
segments including the true apex.
IMPRESSION: 1. Moderately dilated left ventricle with mild concentric
hypertrophy and severely decreased systolic function (LVEF = 22%).

There is diffuse hypokinesis with akinesis of the mid inferior wall,
all apical segments and paradoxical septal motion.

There is significant blood stasis in the apical segment suspicious
for an early thrombus formation.

2. Normal right ventricular size with normal thickness and mildly
impaired systolic function (RVEF = 39%).

3.  Mild left atrial enlargement.

4.  Mild aortic, mitral and tricuspid regurgitation.

5. There is evidence of diffuse left myocardial edema more
pronounced in the basal and mid anterolateral, mid inferior walls,
in all apical segments as well as in right ventricular myocardium.

6. There is mid-wall late gadolinium enhancement in the basal and
mid anteroseptal, inferoseptal, mid anterolateral walls, and the
entire RV free wall. Transmural enhancement is seen in the mid
inferior, all apical segments including the true apex.

Collectively, these findings are consistent with a case of severe
diffuse acute myocarditis involving at least 11 segments of the left
ventricle and RV free wall resulting in the biventricular function
impairement.

RECOMMENDATIONS:
RECOMMENDATIONS
1. Aggressive HF therapy and systemic anticoagulation should be
initiated.

The results were discussed with Dr Drey on 09/05/2014 at 6 pm.

Lorenz Jumper

## 2016-03-03 ENCOUNTER — Encounter (INDEPENDENT_AMBULATORY_CARE_PROVIDER_SITE_OTHER): Payer: Self-pay

## 2016-03-03 ENCOUNTER — Ambulatory Visit (INDEPENDENT_AMBULATORY_CARE_PROVIDER_SITE_OTHER): Payer: Medicare Other

## 2016-03-03 DIAGNOSIS — R778 Other specified abnormalities of plasma proteins: Secondary | ICD-10-CM

## 2016-03-03 DIAGNOSIS — R7989 Other specified abnormal findings of blood chemistry: Secondary | ICD-10-CM | POA: Diagnosis not present

## 2016-03-03 DIAGNOSIS — Z5181 Encounter for therapeutic drug level monitoring: Secondary | ICD-10-CM

## 2016-03-03 DIAGNOSIS — I409 Acute myocarditis, unspecified: Secondary | ICD-10-CM | POA: Diagnosis not present

## 2016-03-03 LAB — POCT INR: INR: 2

## 2016-03-08 ENCOUNTER — Other Ambulatory Visit: Payer: Self-pay | Admitting: Internal Medicine

## 2016-04-02 ENCOUNTER — Other Ambulatory Visit (HOSPITAL_COMMUNITY): Payer: Self-pay | Admitting: Cardiology

## 2016-04-09 ENCOUNTER — Ambulatory Visit (INDEPENDENT_AMBULATORY_CARE_PROVIDER_SITE_OTHER): Payer: Medicare Other | Admitting: Internal Medicine

## 2016-04-09 ENCOUNTER — Encounter: Payer: Self-pay | Admitting: Internal Medicine

## 2016-04-09 VITALS — BP 142/82 | HR 58 | Wt 177.0 lb

## 2016-04-09 DIAGNOSIS — E0590 Thyrotoxicosis, unspecified without thyrotoxic crisis or storm: Secondary | ICD-10-CM | POA: Diagnosis not present

## 2016-04-09 LAB — TSH: TSH: 0.81 u[IU]/mL (ref 0.35–4.50)

## 2016-04-09 LAB — T4, FREE: Free T4: 0.93 ng/dL (ref 0.60–1.60)

## 2016-04-09 LAB — T3, FREE: T3, Free: 2.8 pg/mL (ref 2.3–4.2)

## 2016-04-09 NOTE — Progress Notes (Signed)
Patient ID: Bryan Lamb, male   DOB: 1945-09-19, 70 y.o.   MRN: [id number]   HPI  Bryan Lamb is a 70 y.o.-year-old male, initially referred by his cardiologist, Dr. 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 Last visit 6 mo ago.  Reviewed and addended history: His low TSH was discovered during investigation for heart ds.  Pt has a AMI in 09/03/2014. 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 He is followed by Dr. Rayann Heman.  He had a 2D Echo on 12/06/2014:  - Moderately dilated LV with EF 25% (prev. 20% in 09/2014), diffuse hypokinesis. No LVthrombus. Normal RV size with mildly decreased systolic function. No significant valvular abnormalities.  He saw cardiology in 12/2014 and at that visit, he refused an ICD placement. He was advised to wear his external defibrillator (vest), but stopped using it shortly after. He is on Carvedilol.  He denies SOB, CP, palpitations, syncope.  I reviewed pt's thyroid tests: Lab Results  Component Value Date   TSH 0.20 (L) 10/09/2015   TSH 0.27 (L) 04/08/2015   TSH 0.15 (L) 12/06/2014   TSH 0.187 (L) 10/08/2014   TSH 0.060 (L) 09/04/2014   FREET4 1.17 10/09/2015   FREET4 1.12 04/08/2015   FREET4 1.16 12/06/2014   FREET4 1.05 10/08/2014    TSI     <140 % baseline 36   He had a thyroid uptake and scan on 01/10/2015: 24 hour radio iodine uptake calculated at 9%, slightly below the normal range.  Imaging of the thyroid gland demonstrates poor tracer localization within the thyroid lobes, with a few small areas of tracer localization seen with additional areas of decreased tracer localization.  Findings suggest presence of an enlarged multinodular thyroid gland though localization is insufficient for adequate characterization.  Potentially the thyroid gland could extend into the superior Mediastinum.  Patient's thyroid iodine uptake  was not elevated. He has very mild toxic multinodular  goiter. Since he did not have hyperthyroid symptoms,  We decided to just follow his thyroid labs for now.  Based on the results of the uptake and scan, his goiter could extend into the superior mediastinum. He has no compression symptoms, but I suggested a thyroid ultrasound to better characterize his goiter. He did not go for the U/S. He again refuses one today.  At last visit, we started  low-dose methimazole, 5 mg daily. He did not come back for f/u TFTs...  Pt denies feeling nodules in neck, hoarseness, dysphagia/odynophagia, SOB with lying down; he c/o: - no fatigue - no excessive sweating/heat intolerance - no tremors - no anxiety - no palpitations - no hyperdefecation and constipation - no weight loss/gain - no hair loss  Pt does have a FH of thyroid ds.: sister. No FH of thyroid cancer. No h/o radiation tx to head or neck.  No seaweed or kelp, no recent contrast studies. No steroid use. No herbal supplements. No Biotin use.  ROS: Constitutional: no weight gain, no fatigue, no subjective hyperthermia/hypothermia Eyes: no blurry vision, no xerophthalmia ENT: no sore throat, no nodules palpated in throat, no dysphagia/odynophagia, no hoarseness Cardiovascular: no CP/SOB/palpitations/leg swelling Respiratory: no cough/SOB Gastrointestinal: no N/V/D/C Musculoskeletal: no muscle/joint aches Skin: no rashes Neurological: no tremors/numbness/tingling/dizziness  I reviewed pt's medications, allergies, PMH, social hx, family hx, and changes were documented in the history of present illness. Otherwise, unchanged from my initial visit note.  Past Medical History:  Diagnosis Date  . Chronic systolic dysfunction of left ventricle   .  Nonischemic cardiomyopathy ([place])   . NSVT (nonsustained ventricular tachycardia) ([place])   . Urinary incontinence    Past Surgical History:  Procedure Laterality Date  . ANKLE FRACTURE SURGERY    . KNEE SURGERY    . LEFT HEART CATHETERIZATION WITH  CORONARY ANGIOGRAM N/A 09/04/2014   Procedure: LEFT HEART CATHETERIZATION WITH CORONARY ANGIOGRAM;  Surgeon: Sanda Klein, MD;  Location: [place] [facility];  Service: Cardiovascular;  Laterality: N/A;   History   Social History  . Marital Status: Married    Spouse Name: N/A  . Number of Children: N/A   Occupational History  . N/a   Social History Main Topics  . Smoking status: Light Tobacco Smoker  . Smokeless tobacco: Not on file  . Alcohol Use: No  . Drug Use: No   Current Outpatient Prescriptions on File Prior to Visit  Medication Sig Dispense Refill  . atorvastatin (LIPITOR) 40 MG tablet take 1 tablet by mouth once daily AT 6 PM 90 tablet 3  . BIDIL 20-37.5 MG tablet take 1 tablet by mouth three times a day 90 tablet 3  . carvedilol (COREG) 12.5 MG tablet TAKE 1 AND 1/2 TABLETS BY MOUTH  TWICE DAILY WITH MEALS 90 tablet 3  . finasteride (PROSCAR) 5 MG tablet Take 5 mg by mouth daily.  0  . methimazole (TAPAZOLE) 5 MG tablet take 1 tablet by mouth once daily 90 tablet 1  . nitroGLYCERIN (NITROSTAT) 0.4 MG SL tablet Place 1 tablet (0.4 mg total) under the tongue every 5 (five) minutes x 3 doses as needed for chest pain. 25 tablet 12  . sacubitril-valsartan (ENTRESTO) 24-26 MG Take 1 tablet by mouth 2 (two) times daily. 60 tablet 3  . warfarin (COUMADIN) 7.5 MG tablet Take as directed by coumadin clinic. 30 tablet 3  . atorvastatin (LIPITOR) 80 MG tablet   1   No current facility-administered medications on file prior to visit.    No Known Allergies   FH: - see HPI  PE: BP (!) 142/82 (BP Location: Left Arm, Patient Position: Sitting)   Pulse (!) 58   Wt 177 lb (80.3 kg)   SpO2 97%   BMI 27.72 kg/m  Body mass index is 27.72 kg/m. Wt Readings from Last 3 Encounters:  04/09/16 177 lb (80.3 kg)  10/09/15 179 lb (81.2 kg)  04/08/15 174 lb (78.9 kg)   Constitutional: normal weight, in NAD Eyes: PERRLA, EOMI, slight B exophthalmos, no lid lag, no stare ENT: moist mucous  membranes, L thyroid fullness, no cervical lymphadenopathy Cardiovascular: RRR, No MRG Respiratory: CTA B Gastrointestinal: abdomen soft, NT, ND, BS+ Musculoskeletal: no deformities, strength intact in all 4 Skin: moist, warm, no rashes Neurological: no tremor with outstretched hands, DTR normal in all 4  ASSESSMENT: 1. Mild Toxic MNC - Subclinical Thyrotoxicosis  PLAN:  1. Patient with Subclinical Thyrotoxicosis, with normal free T4 and free T3 and without thyrotoxic sxs: weight loss, heat intolerance, hyperdefecation, palpitations, anxiety.  He does have significant CHF, with an EF of 25% , and was recommended an ICD, which he refused... I again advised him that we need to try to get his thyroid tests under control, as hyperthyroidism may influence his heart function. We started MMI 5 mg daily >> feels well on this, but did not come back for labs >> has been on this dose for 5 mo. -  We will check TSH, fT3 and fT4  Today, and, depending on the results, we may need radioactive iodine treatment, continue  low-dose methimazole or even decrease MMi dose. -  As his thyroid may descend into the upper mediastinum per his  Thyroid scan report,  I again discussed with him to get a thyroid ultrasound to further investigate this. He refuses this. We can just follow this clinically for now especially since no SOB or dysphagia. - he is on beta blocker: carvedilol (per cardiology) - RTC in 6 months, but possibly sooner for repeat labs  Office Visit on 04/09/2016  Component Date Value Ref Range Status  . Free T4 04/09/2016 0.93  0.60 - 1.60 ng/dL Final   Comment: Specimens from patients who are undergoing biotin therapy and /or ingesting biotin supplements may contain high levels of biotin.  The higher biotin concentration in these specimens interferes with this Free T4 assay.  Specimens that contain high levels  of biotin may cause false high results for this Free T4 assay.  Please interpret results in  light of the total clinical presentation of the patient.    . T3, Free 04/09/2016 2.8  2.3 - 4.2 pg/mL Final  . TSH 04/09/2016 0.81  0.35 - 4.50 uIU/mL Final   Labs are normal. We can continue with the current dose of methimazole for now. I will recheck his tests when he comes back in 6 months.  Philemon Kingdom, MD PhD [facility] Endocr[facility]

## 2016-04-09 NOTE — Patient Instructions (Signed)
Please continue Methimazole 5 mg daily.  Please stop at the lab.  Please return in 6 months.   

## 2016-04-10 ENCOUNTER — Telehealth: Payer: Self-pay

## 2016-04-10 ENCOUNTER — Other Ambulatory Visit (HOSPITAL_COMMUNITY): Payer: Self-pay | Admitting: Cardiology

## 2016-04-10 NOTE — Telephone Encounter (Signed)
Called and spoke with patient wife, advised her of lab results and to continue current dose of methimazole, no changes made at this time. No questions or concerns.

## 2016-04-15 ENCOUNTER — Ambulatory Visit (INDEPENDENT_AMBULATORY_CARE_PROVIDER_SITE_OTHER): Payer: Medicare Other | Admitting: *Deleted

## 2016-04-15 DIAGNOSIS — R748 Abnormal levels of other serum enzymes: Secondary | ICD-10-CM | POA: Diagnosis not present

## 2016-04-15 DIAGNOSIS — Z5181 Encounter for therapeutic drug level monitoring: Secondary | ICD-10-CM | POA: Diagnosis not present

## 2016-04-15 DIAGNOSIS — R7989 Other specified abnormal findings of blood chemistry: Secondary | ICD-10-CM

## 2016-04-15 DIAGNOSIS — I409 Acute myocarditis, unspecified: Secondary | ICD-10-CM | POA: Diagnosis not present

## 2016-04-15 DIAGNOSIS — R778 Other specified abnormalities of plasma proteins: Secondary | ICD-10-CM

## 2016-04-15 LAB — POCT INR: INR: 2.1

## 2016-05-27 ENCOUNTER — Ambulatory Visit (INDEPENDENT_AMBULATORY_CARE_PROVIDER_SITE_OTHER): Payer: Medicare Other | Admitting: *Deleted

## 2016-05-27 DIAGNOSIS — R778 Other specified abnormalities of plasma proteins: Secondary | ICD-10-CM

## 2016-05-27 DIAGNOSIS — R7989 Other specified abnormal findings of blood chemistry: Secondary | ICD-10-CM

## 2016-05-27 DIAGNOSIS — R748 Abnormal levels of other serum enzymes: Secondary | ICD-10-CM

## 2016-05-27 DIAGNOSIS — Z5181 Encounter for therapeutic drug level monitoring: Secondary | ICD-10-CM

## 2016-05-27 DIAGNOSIS — I409 Acute myocarditis, unspecified: Secondary | ICD-10-CM

## 2016-05-27 LAB — POCT INR: INR: 2.1

## 2016-06-06 ENCOUNTER — Other Ambulatory Visit (HOSPITAL_COMMUNITY): Payer: Self-pay | Admitting: Cardiology

## 2016-06-25 ENCOUNTER — Other Ambulatory Visit: Payer: Self-pay | Admitting: Cardiology

## 2016-06-25 DIAGNOSIS — Z7901 Long term (current) use of anticoagulants: Secondary | ICD-10-CM

## 2016-07-08 ENCOUNTER — Ambulatory Visit (INDEPENDENT_AMBULATORY_CARE_PROVIDER_SITE_OTHER): Payer: Medicare Other | Admitting: *Deleted

## 2016-07-08 DIAGNOSIS — I409 Acute myocarditis, unspecified: Secondary | ICD-10-CM | POA: Diagnosis not present

## 2016-07-08 DIAGNOSIS — R748 Abnormal levels of other serum enzymes: Secondary | ICD-10-CM | POA: Diagnosis not present

## 2016-07-08 DIAGNOSIS — R778 Other specified abnormalities of plasma proteins: Secondary | ICD-10-CM

## 2016-07-08 DIAGNOSIS — Z5181 Encounter for therapeutic drug level monitoring: Secondary | ICD-10-CM

## 2016-07-08 DIAGNOSIS — R7989 Other specified abnormal findings of blood chemistry: Secondary | ICD-10-CM

## 2016-07-08 LAB — POCT INR: INR: 1.7

## 2016-07-15 ENCOUNTER — Other Ambulatory Visit: Payer: Self-pay | Admitting: Internal Medicine

## 2016-07-29 ENCOUNTER — Ambulatory Visit (INDEPENDENT_AMBULATORY_CARE_PROVIDER_SITE_OTHER): Payer: Medicare Other | Admitting: *Deleted

## 2016-07-29 DIAGNOSIS — R748 Abnormal levels of other serum enzymes: Secondary | ICD-10-CM

## 2016-07-29 DIAGNOSIS — R7989 Other specified abnormal findings of blood chemistry: Secondary | ICD-10-CM

## 2016-07-29 DIAGNOSIS — Z5181 Encounter for therapeutic drug level monitoring: Secondary | ICD-10-CM

## 2016-07-29 DIAGNOSIS — I409 Acute myocarditis, unspecified: Secondary | ICD-10-CM

## 2016-07-29 DIAGNOSIS — R778 Other specified abnormalities of plasma proteins: Secondary | ICD-10-CM

## 2016-07-29 LAB — POCT INR: INR: 1.8

## 2016-08-01 ENCOUNTER — Other Ambulatory Visit (HOSPITAL_COMMUNITY): Payer: Self-pay | Admitting: Cardiology

## 2016-08-12 ENCOUNTER — Ambulatory Visit (INDEPENDENT_AMBULATORY_CARE_PROVIDER_SITE_OTHER): Payer: Medicare Other | Admitting: *Deleted

## 2016-08-12 ENCOUNTER — Encounter (INDEPENDENT_AMBULATORY_CARE_PROVIDER_SITE_OTHER): Payer: Self-pay

## 2016-08-12 DIAGNOSIS — I409 Acute myocarditis, unspecified: Secondary | ICD-10-CM

## 2016-08-12 DIAGNOSIS — Z5181 Encounter for therapeutic drug level monitoring: Secondary | ICD-10-CM | POA: Diagnosis not present

## 2016-08-12 DIAGNOSIS — R778 Other specified abnormalities of plasma proteins: Secondary | ICD-10-CM

## 2016-08-12 DIAGNOSIS — R748 Abnormal levels of other serum enzymes: Secondary | ICD-10-CM | POA: Diagnosis not present

## 2016-08-12 DIAGNOSIS — R7989 Other specified abnormal findings of blood chemistry: Secondary | ICD-10-CM

## 2016-08-12 LAB — POCT INR: INR: 2.5

## 2016-09-02 ENCOUNTER — Ambulatory Visit (INDEPENDENT_AMBULATORY_CARE_PROVIDER_SITE_OTHER): Payer: Medicare Other | Admitting: *Deleted

## 2016-09-02 DIAGNOSIS — I409 Acute myocarditis, unspecified: Secondary | ICD-10-CM

## 2016-09-02 DIAGNOSIS — R748 Abnormal levels of other serum enzymes: Secondary | ICD-10-CM | POA: Diagnosis not present

## 2016-09-02 DIAGNOSIS — R778 Other specified abnormalities of plasma proteins: Secondary | ICD-10-CM

## 2016-09-02 DIAGNOSIS — Z5181 Encounter for therapeutic drug level monitoring: Secondary | ICD-10-CM | POA: Diagnosis not present

## 2016-09-02 DIAGNOSIS — R7989 Other specified abnormal findings of blood chemistry: Secondary | ICD-10-CM

## 2016-09-02 LAB — POCT INR: INR: 2.9

## 2016-09-30 ENCOUNTER — Ambulatory Visit (INDEPENDENT_AMBULATORY_CARE_PROVIDER_SITE_OTHER): Payer: Medicare Other | Admitting: Pharmacist

## 2016-09-30 DIAGNOSIS — R778 Other specified abnormalities of plasma proteins: Secondary | ICD-10-CM

## 2016-09-30 DIAGNOSIS — Z5181 Encounter for therapeutic drug level monitoring: Secondary | ICD-10-CM | POA: Diagnosis not present

## 2016-09-30 DIAGNOSIS — R7989 Other specified abnormal findings of blood chemistry: Secondary | ICD-10-CM

## 2016-09-30 DIAGNOSIS — R748 Abnormal levels of other serum enzymes: Secondary | ICD-10-CM | POA: Diagnosis not present

## 2016-09-30 DIAGNOSIS — I409 Acute myocarditis, unspecified: Secondary | ICD-10-CM | POA: Diagnosis not present

## 2016-09-30 LAB — POCT INR: INR: 2.6

## 2016-10-13 ENCOUNTER — Ambulatory Visit (INDEPENDENT_AMBULATORY_CARE_PROVIDER_SITE_OTHER): Payer: Medicare Other | Admitting: Internal Medicine

## 2016-10-13 ENCOUNTER — Encounter: Payer: Self-pay | Admitting: Internal Medicine

## 2016-10-13 VITALS — BP 120/64 | HR 75 | Wt 179.0 lb

## 2016-10-13 DIAGNOSIS — E0520 Thyrotoxicosis with toxic multinodular goiter without thyrotoxic crisis or storm: Secondary | ICD-10-CM | POA: Diagnosis not present

## 2016-10-13 LAB — T4, FREE: Free T4: 0.95 ng/dL (ref 0.60–1.60)

## 2016-10-13 LAB — T3, FREE: T3, Free: 3.8 pg/mL (ref 2.3–4.2)

## 2016-10-13 LAB — TSH: TSH: 1.23 u[IU]/mL (ref 0.35–4.50)

## 2016-10-13 NOTE — Progress Notes (Signed)
Patient ID: Bryan Lamb, male   DOB: 04-Sep-1945, 71 y.o.   MRN: [id number]   HPI  Bryan Lamb is a 71 y.o.-year-old male, initially referred by his cardiologist, Dr. 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 Last visit 6 mo ago.   Reviewed and addended history: His low TSH was discovered during investigation for heart ds.  Pt has a AMI in 09/03/2014. 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 He is followed by Dr. Rayann Heman.  He had a 2D Echo on 12/06/2014:  - Moderately dilated LV with EF 25% (prev. 20% in 09/2014), diffuse hypokinesis. No LVthrombus. Normal RV size with mildly decreased systolic function. No significant valvular abnormalities.  He saw cardiology in 12/2014 and at that visit, he refused an ICD placement. He was advised to wear his external defibrillator (vest), but stopped using it shortly after. He is on Carvedilol.  He denies SOB, CP, palpitations, syncope.  We started low-dose methimazole, 5 mg daily. He usually does not come back for f/u TFTs between appts... He is compliant with MMI, though. He has a pillbox >> no missed doses.  I reviewed pt's thyroid tests - last TSH normal, on MMI: Lab Results  Component Value Date   TSH 0.81 04/09/2016   TSH 0.20 (L) 10/09/2015   TSH 0.27 (L) 04/08/2015   TSH 0.15 (L) 12/06/2014   TSH 0.187 (L) 10/08/2014   TSH 0.060 (L) 09/04/2014   FREET4 0.93 04/09/2016   FREET4 1.17 10/09/2015   FREET4 1.12 04/08/2015   FREET4 1.16 12/06/2014   FREET4 1.05 10/08/2014    TSI     <140 % baseline 36   He had a thyroid uptake and scan on 01/10/2015: 24 hour radio iodine uptake calculated at 9%, slightly below the normal range.  Imaging of the thyroid gland demonstrates poor tracer localization within the thyroid lobes, with a few small areas of tracer localization seen with additional areas of decreased tracer localization.  Findings suggest presence of an enlarged multinodular  thyroid gland though localization is insufficient for adequate characterization.  Potentially the thyroid gland could extend into the superior Mediastinum.  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 Since he did not have hyperthyroid symptoms,  we decided to just follow his thyroid labs.  Based on the results of the uptake and scan, his goiter could extend into the superior mediastinum (no compression symptoms), but at each visit, including today), I suggested a thyroid ultrasound to better characterize his goiter. He again refused this at last visit.  Pt denies feeling nodules in neck, hoarseness, dysphagia/odynophagia, SOB with lying down; he mentions: - no fatigue - no excessive sweating/heat intolerance - no tremors - no anxiety - no palpitations - no hyperdefecation and constipation - no weight loss/gain - no hair loss  Pt does have a FH of thyroid ds.: sister. No FH of thyroid cancer. No h/o radiation tx to head or neck.  No seaweed or kelp, no recent contrast studies. No steroid use. No herbal supplements. No Biotin use.  ROS: Constitutional: no weight gain, no fatigue, no subjective hyperthermia/hypothermia Eyes: no blurry vision, no xerophthalmia ENT: no sore throat, no nodules palpated in throat, no dysphagia/odynophagia, no hoarseness Cardiovascular: no CP/SOB/palpitations/leg swelling Respiratory: no cough/SOB Gastrointestinal: no N/V/D/C Musculoskeletal: no muscle/joint aches Skin: no rashes Neurological: no tremors/numbness/tingling/dizziness  I reviewed pt's medications, allergies, PMH, social hx, family hx, and changes were documented in  the history of present illness. Otherwise, unchanged from my initial visit note.  Past Medical History:  Diagnosis Date  . Chronic systolic dysfunction of left ventricle   . Nonischemic cardiomyopathy ([place])   . NSVT (nonsustained ventricular tachycardia) ([place])   .  Urinary incontinence    Past Surgical History:  Procedure Laterality Date  . ANKLE FRACTURE SURGERY    . KNEE SURGERY    . LEFT HEART CATHETERIZATION WITH CORONARY ANGIOGRAM N/A 09/04/2014   Procedure: LEFT HEART CATHETERIZATION WITH CORONARY ANGIOGRAM;  Surgeon: Sanda Klein, MD;  Location: [facility];  Service: Cardiovascular;  Laterality: N/A;   History   Social History  . Marital Status: Married    Spouse Name: N/A  . Number of Children: N/A   Occupational History  . N/a   Social History Main Topics  . Smoking status: Light Tobacco Smoker  . Smokeless tobacco: Not on file  . Alcohol Use: No  . Drug Use: No   Current Outpatient Prescriptions on File Prior to Visit  Medication Sig Dispense Refill  . atorvastatin (LIPITOR) 40 MG tablet take 1 tablet by mouth once daily AT 6 PM 90 tablet 3  . atorvastatin (LIPITOR) 80 MG tablet   1  . BIDIL 20-37.5 MG tablet take 1 tablet by mouth three times a day 90 tablet 3  . BIDIL 20-37.5 MG tablet take 1 tablet by mouth three times a day 90 tablet 3  . carvedilol (COREG) 12.5 MG tablet take 1 and 1/2 tablets by mouth twice a day with food 90 tablet 3  . finasteride (PROSCAR) 5 MG tablet Take 5 mg by mouth daily.  0  . methimazole (TAPAZOLE) 5 MG tablet take 1 tablet by mouth once daily 90 tablet 1  . nitroGLYCERIN (NITROSTAT) 0.4 MG SL tablet Place 1 tablet (0.4 mg total) under the tongue every 5 (five) minutes x 3 doses as needed for chest pain. 25 tablet 12  . sacubitril-valsartan (ENTRESTO) 24-26 MG Take 1 tablet by mouth 2 (two) times daily. 60 tablet 3  . warfarin (COUMADIN) 7.5 MG tablet as directed 30 tablet 3   No current facility-administered medications on file prior to visit.    No Known Allergies   FH: - see HPI  PE: BP 120/64 (BP Location: Left Arm, Patient Position: Sitting)   Pulse 75   Wt 179 lb (81.2 kg)   SpO2 97%   BMI 28.04 kg/m  Body mass index is 28.04 kg/m. Wt Readings from Last 3 Encounters:   10/13/16 179 lb (81.2 kg)  04/09/16 177 lb (80.3 kg)  10/09/15 179 lb (81.2 kg)   Constitutional: normal weight, in NAD Eyes: PERRLA, EOMI, slight B exophthalmos, no lid lag, no stare ENT: moist mucous membranes, L thyroid fullness, no cervical lymphadenopathy Cardiovascular: RRR, No MRG Respiratory: CTA B Gastrointestinal: abdomen soft, NT, ND, BS+ Musculoskeletal: no deformities, strength intact in all 4 Skin: moist, warm, no rashes Neurological: no tremor with outstretched hands, DTR normal in all 4  ASSESSMENT: 1. Mild Toxic MNG - Subclinical Thyrotoxicosis  PLAN:  1. Patient with Subclinical Thyrotoxicosis in the context of mild toxic multinodular goiter. He had slightly low TSH with normal free thyroid hormones and without thyrotoxic symptoms. He denies weight loss, heat intolerance, hyper defecation, palpitations, anxiety. He does have significant CHF, with an EF of 25% , and was recommended an ICD, which he refused. He is also off his defibrillator vest. However, he mentions that he has no chest pain, shortness of  breath, or palpitations. He also does not have fluid retention or other signs of CHF. - We again discussed about different options for treatment of his TMNG. He is doing well on low-dose methimazole and refuses again RAI treatment or surgery. Also, with his labs normal at last check, I feel it is okay to continue with this treatment, although, it is not definitive treatment for his condition. -  We will check TSH, fT3 and fT4  today, and, depending on the results, we may need to change the MMI dose accordingly - his thyroid may descend into the upper mediastinum per his thyroid scan report,  but he repeatedly refused a thyroid ultrasound to further investigate this. We can just follow this clinically as he has no SOB or dysphagia. - he is on beta blocker: carvedilol (per cardiology)  - RTC in 6 months, but possibly sooner for repeat labs  Component     Latest Ref Rng &  Units 10/13/2016  TSH     0.35 - 4.50 uIU/mL 1.23  Triiodothyronine,Free,Serum     2.3 - 4.2 pg/mL 3.8  T4,Free(Direct)     0.60 - 1.60 ng/dL 0.95   Labs are normal. We'll continue with methimazole 5 mg daily.  Philemon Kingdom, MD PhD [facility]

## 2016-10-13 NOTE — Patient Instructions (Signed)
Please continue Methimazole 5 mg daily.  Please stop at the lab.  Please return in 6 months.   

## 2016-10-14 ENCOUNTER — Telehealth: Payer: Self-pay

## 2016-10-14 NOTE — Telephone Encounter (Signed)
LVM, gave lab results. Gave call back number if any questions or concerns.  

## 2016-10-14 NOTE — Telephone Encounter (Signed)
-----   Message from Philemon Kingdom, MD sent at 10/13/2016  6:08 PM EDT ----- Almyra Free, can you please call pt: Labs are normal. He can continue with the current dose of methimazole, 5 mg daily.

## 2016-11-11 ENCOUNTER — Ambulatory Visit (INDEPENDENT_AMBULATORY_CARE_PROVIDER_SITE_OTHER): Payer: Medicare Other | Admitting: Pharmacist

## 2016-11-11 ENCOUNTER — Other Ambulatory Visit (HOSPITAL_COMMUNITY): Payer: Self-pay | Admitting: Cardiology

## 2016-11-11 DIAGNOSIS — I409 Acute myocarditis, unspecified: Secondary | ICD-10-CM

## 2016-11-11 DIAGNOSIS — Z5181 Encounter for therapeutic drug level monitoring: Secondary | ICD-10-CM | POA: Diagnosis not present

## 2016-11-11 DIAGNOSIS — R748 Abnormal levels of other serum enzymes: Secondary | ICD-10-CM

## 2016-11-11 DIAGNOSIS — R7989 Other specified abnormal findings of blood chemistry: Secondary | ICD-10-CM

## 2016-11-11 DIAGNOSIS — R778 Other specified abnormalities of plasma proteins: Secondary | ICD-10-CM

## 2016-11-11 LAB — POCT INR: INR: 3.6

## 2016-11-25 ENCOUNTER — Other Ambulatory Visit: Payer: Self-pay | Admitting: Cardiology

## 2016-11-25 DIAGNOSIS — Z7901 Long term (current) use of anticoagulants: Secondary | ICD-10-CM

## 2016-12-03 ENCOUNTER — Other Ambulatory Visit: Payer: Self-pay | Admitting: Cardiology

## 2016-12-09 ENCOUNTER — Ambulatory Visit (INDEPENDENT_AMBULATORY_CARE_PROVIDER_SITE_OTHER): Payer: Medicare Other | Admitting: *Deleted

## 2016-12-09 DIAGNOSIS — R748 Abnormal levels of other serum enzymes: Secondary | ICD-10-CM | POA: Diagnosis not present

## 2016-12-09 DIAGNOSIS — R7989 Other specified abnormal findings of blood chemistry: Secondary | ICD-10-CM

## 2016-12-09 DIAGNOSIS — I409 Acute myocarditis, unspecified: Secondary | ICD-10-CM | POA: Diagnosis not present

## 2016-12-09 DIAGNOSIS — Z5181 Encounter for therapeutic drug level monitoring: Secondary | ICD-10-CM | POA: Diagnosis not present

## 2016-12-09 DIAGNOSIS — R778 Other specified abnormalities of plasma proteins: Secondary | ICD-10-CM

## 2016-12-09 LAB — POCT INR: INR: 4

## 2016-12-14 ENCOUNTER — Other Ambulatory Visit (HOSPITAL_COMMUNITY): Payer: Self-pay | Admitting: Cardiology

## 2016-12-17 ENCOUNTER — Encounter: Payer: Self-pay | Admitting: Internal Medicine

## 2016-12-17 ENCOUNTER — Ambulatory Visit (INDEPENDENT_AMBULATORY_CARE_PROVIDER_SITE_OTHER): Payer: Medicare Other | Admitting: Internal Medicine

## 2016-12-17 VITALS — BP 146/70 | HR 61 | Ht 68.0 in | Wt 176.0 lb

## 2016-12-17 DIAGNOSIS — I5021 Acute systolic (congestive) heart failure: Secondary | ICD-10-CM

## 2016-12-17 DIAGNOSIS — R0602 Shortness of breath: Secondary | ICD-10-CM | POA: Diagnosis not present

## 2016-12-17 DIAGNOSIS — I428 Other cardiomyopathies: Secondary | ICD-10-CM

## 2016-12-17 DIAGNOSIS — I519 Heart disease, unspecified: Secondary | ICD-10-CM | POA: Diagnosis not present

## 2016-12-17 MED ORDER — ISOSORB DINITRATE-HYDRALAZINE 20-37.5 MG PO TABS: 1.0000 | ORAL_TABLET | Freq: Three times a day (TID) | ORAL | 0 refills | Status: DC

## 2016-12-17 MED ORDER — ATORVASTATIN CALCIUM 40 MG PO TABS: ORAL_TABLET | ORAL | 3 refills | Status: DC

## 2016-12-17 MED ORDER — CARVEDILOL 12.5 MG PO TABS: ORAL_TABLET | ORAL | 0 refills | Status: DC

## 2016-12-17 MED ORDER — SACUBITRIL-VALSARTAN 24-26 MG PO TABS: 1.0000 | ORAL_TABLET | Freq: Two times a day (BID) | ORAL | 3 refills | Status: DC

## 2016-12-17 NOTE — Patient Instructions (Signed)
Medication Instructions:  Your physician recommends that you continue on your current medications as directed. Please refer to the Current Medication list given to you today.   Labwork: Your physician recommends that you return for lab work today: BNP/BMP/Mag   Testing/Procedures: Your physician has requested that you have an echocardiogram. Echocardiography is a painless test that uses sound waves to create images of your heart. It provides your doctor with information about the size and shape of your heart and how well your heart's chambers and valves are working. This procedure takes approximately one hour. There are no restrictions for this procedure.    Follow-Up: Your physician recommends that you schedule a follow-up appointment as needed with Dr Rayann Heman.  Needs to get back in with the CHF clinic   Any Other Special Instructions Will Be Listed Below (If Applicable).     If you need a refill on your cardiac medications before your next appointment, please call your pharmacy.

## 2016-12-17 NOTE — Progress Notes (Signed)
Primary Cardiologist: Dr Marin Shutter Bryan Lamb is a 71 y.o. male who presents today for electrophysiology followup.  Since last being seen in our clinic, the patient reports doing very well.  He states "I dont know anything is wrong with my heart".  He is active, without limitation and continues to mow his own lawn without difficulty.  He does have SOB with heavy exertion.  Today, he denies symptoms of palpitations, chest pain, orthopnea, PND, lower extremity edema, dizziness, presyncope, or syncope.  The patient is otherwise without complaint today.   Past Medical History:  Diagnosis Date  . Chronic systolic dysfunction of left ventricle   . Nonischemic cardiomyopathy ([place])   . NSVT (nonsustained ventricular tachycardia) ([place])   . Urinary incontinence    Past Surgical History:  Procedure Laterality Date  . ANKLE FRACTURE SURGERY    . KNEE SURGERY    . LEFT HEART CATHETERIZATION WITH CORONARY ANGIOGRAM N/A 09/04/2014   Procedure: LEFT HEART CATHETERIZATION WITH CORONARY ANGIOGRAM;  Surgeon: Sanda Klein, MD;  Location: [facility];  Service: Cardiovascular;  Laterality: N/A;    ROS- all systems are reviewed and negatives except as per HPI above  Current Outpatient Prescriptions  Medication Sig Dispense Refill  . atorvastatin (LIPITOR) 40 MG tablet take 1 tablet by mouth once daily AT 6 PM 90 tablet 3  . BIDIL 20-37.5 MG tablet take 1 tablet by mouth three times a day 90 tablet 3  . carvedilol (COREG) 12.5 MG tablet take 1 and 1/2 tablets by mouth twice a day with food 90 tablet 3  . finasteride (PROSCAR) 5 MG tablet Take 5 mg by mouth daily.  0  . methimazole (TAPAZOLE) 5 MG tablet take 1 tablet by mouth once daily 90 tablet 1  . nitroGLYCERIN (NITROSTAT) 0.4 MG SL tablet Place 1 tablet (0.4 mg total) under the tongue every 5 (five) minutes x 3 doses as needed for chest pain. 25 tablet 12  . sacubitril-valsartan (ENTRESTO) 24-26 MG Take 1 tablet by mouth 2 (two) times daily. 60  tablet 3  . warfarin (COUMADIN) 7.5 MG tablet take as directed by prescriber 30 tablet 3   No current facility-administered medications for this visit.     Physical Exam: Vitals:   12/17/16 1152  BP: (!) 146/70  Pulse: 61  SpO2: 97%  Weight: 176 lb (79.8 kg)  Height: 5\' 8"  (1.727 m)    GEN- The patient is well appearing, alert and oriented x 3 today.   Head- normocephalic, atraumatic Eyes-  Sclera clear, conjunctiva pink Ears- hearing intact Oropharynx- clear Lungs- Clear to ausculation bilaterally, normal work of breathing Heart- Regular rate and rhythm, no murmurs, rubs or gallops, PMI not laterally displaced GI- soft, NT, ND, + BS Extremities- no clubbing, cyanosis, or edema  EKG tracing ordered today is personally reviewed and shows sinus rhythm with diffuse prominent TWI  Assessment and Plan:  The patient has a nonischemic CM.  He has not seen any cardiologists in 2 years.  He reports compliance with medicines but has been out of them recently as he has not seen a CV provider.  His only reason to come to the office today was that he was out of medicines.  He remains clear that he is not interested in an ICD.  Given noncompliance, he is currently not a candidate anyway.  I would favor having him re-establish with the CHF team for medicine management.  I will obtain echo and labs today and then given him  enough medicine until he can be seen in the CHF clinic.  It is not clear to me that he will continue to require coumadin.  I will update echo and have Dr Aundra Dubin decide when he sees him again.  Return to EP clinic as needed  Thompson Grayer MD, [facility] 12/17/2016 12:25 PM

## 2016-12-18 LAB — BASIC METABOLIC PANEL
BUN/Creatinine Ratio: 10 (ref 10–24)
BUN: 10 mg/dL (ref 8–27)
CALCIUM: 8.9 mg/dL (ref 8.6–10.2)
CHLORIDE: 105 mmol/L (ref 96–106)
CO2: 21 mmol/L (ref 20–29)
Creatinine, Ser: 0.96 mg/dL (ref 0.76–1.27)
GFR calc Af Amer: 91 mL/min/{1.73_m2} (ref >59)
GFR calc non Af Amer: 79 mL/min/{1.73_m2} (ref >59)
GLUCOSE: 78 mg/dL (ref 65–99)
POTASSIUM: 4.2 mmol/L (ref 3.5–5.2)
Sodium: 142 mmol/L (ref 134–144)

## 2016-12-18 LAB — MAGNESIUM: MAGNESIUM: 1.9 mg/dL (ref 1.6–2.3)

## 2016-12-18 LAB — PRO B NATRIURETIC PEPTIDE: NT-Pro BNP: 500 pg/mL — ABNORMAL HIGH (ref 0–376)

## 2016-12-23 ENCOUNTER — Ambulatory Visit (INDEPENDENT_AMBULATORY_CARE_PROVIDER_SITE_OTHER): Payer: Medicare Other | Admitting: *Deleted

## 2016-12-23 DIAGNOSIS — I409 Acute myocarditis, unspecified: Secondary | ICD-10-CM

## 2016-12-23 DIAGNOSIS — Z5181 Encounter for therapeutic drug level monitoring: Secondary | ICD-10-CM

## 2016-12-23 DIAGNOSIS — R7989 Other specified abnormal findings of blood chemistry: Secondary | ICD-10-CM

## 2016-12-23 DIAGNOSIS — R748 Abnormal levels of other serum enzymes: Secondary | ICD-10-CM

## 2016-12-23 DIAGNOSIS — R778 Other specified abnormalities of plasma proteins: Secondary | ICD-10-CM

## 2016-12-23 LAB — POCT INR: INR: 2.5

## 2017-01-02 ENCOUNTER — Other Ambulatory Visit: Payer: Self-pay | Admitting: Internal Medicine

## 2017-01-04 ENCOUNTER — Other Ambulatory Visit: Payer: Self-pay

## 2017-01-04 ENCOUNTER — Ambulatory Visit (HOSPITAL_COMMUNITY): Payer: Medicare Other | Attending: Internal Medicine

## 2017-01-04 DIAGNOSIS — I428 Other cardiomyopathies: Secondary | ICD-10-CM | POA: Diagnosis not present

## 2017-01-04 DIAGNOSIS — I071 Rheumatic tricuspid insufficiency: Secondary | ICD-10-CM | POA: Diagnosis not present

## 2017-01-11 ENCOUNTER — Other Ambulatory Visit: Payer: Self-pay | Admitting: Internal Medicine

## 2017-01-13 ENCOUNTER — Ambulatory Visit (INDEPENDENT_AMBULATORY_CARE_PROVIDER_SITE_OTHER): Payer: Medicare Other | Admitting: *Deleted

## 2017-01-13 DIAGNOSIS — I409 Acute myocarditis, unspecified: Secondary | ICD-10-CM | POA: Diagnosis not present

## 2017-01-13 DIAGNOSIS — Z5181 Encounter for therapeutic drug level monitoring: Secondary | ICD-10-CM

## 2017-01-13 DIAGNOSIS — R7989 Other specified abnormal findings of blood chemistry: Secondary | ICD-10-CM

## 2017-01-13 DIAGNOSIS — R778 Other specified abnormalities of plasma proteins: Secondary | ICD-10-CM

## 2017-01-13 DIAGNOSIS — R748 Abnormal levels of other serum enzymes: Secondary | ICD-10-CM

## 2017-01-13 LAB — POCT INR: INR: 3.2

## 2017-01-27 ENCOUNTER — Telehealth: Payer: Self-pay

## 2017-02-01 ENCOUNTER — Other Ambulatory Visit: Payer: Self-pay | Admitting: Internal Medicine

## 2017-02-03 ENCOUNTER — Ambulatory Visit (INDEPENDENT_AMBULATORY_CARE_PROVIDER_SITE_OTHER): Payer: Medicare Other | Admitting: *Deleted

## 2017-02-03 DIAGNOSIS — R778 Other specified abnormalities of plasma proteins: Secondary | ICD-10-CM

## 2017-02-03 DIAGNOSIS — Z5181 Encounter for therapeutic drug level monitoring: Secondary | ICD-10-CM

## 2017-02-03 DIAGNOSIS — R7989 Other specified abnormal findings of blood chemistry: Secondary | ICD-10-CM

## 2017-02-03 DIAGNOSIS — I409 Acute myocarditis, unspecified: Secondary | ICD-10-CM

## 2017-02-03 DIAGNOSIS — R748 Abnormal levels of other serum enzymes: Secondary | ICD-10-CM | POA: Diagnosis not present

## 2017-02-03 LAB — POCT INR: INR: 3.3

## 2017-02-04 ENCOUNTER — Ambulatory Visit (HOSPITAL_COMMUNITY)
Admission: RE | Admit: 2017-02-04 | Discharge: 2017-02-04 | Disposition: A | Discharge status: Home / Self Care | Payer: Medicare Other | Source: Ambulatory Visit | Attending: Cardiology | Admitting: Cardiology

## 2017-02-04 ENCOUNTER — Encounter (HOSPITAL_COMMUNITY): Payer: Self-pay | Admitting: Cardiology

## 2017-02-04 VITALS — BP 100/60 | HR 67 | Wt 166.1 lb

## 2017-02-04 DIAGNOSIS — I429 Cardiomyopathy, unspecified: Secondary | ICD-10-CM | POA: Insufficient documentation

## 2017-02-04 DIAGNOSIS — E0520 Thyrotoxicosis with toxic multinodular goiter without thyrotoxic crisis or storm: Secondary | ICD-10-CM | POA: Diagnosis not present

## 2017-02-04 DIAGNOSIS — Z79899 Other long term (current) drug therapy: Secondary | ICD-10-CM | POA: Insufficient documentation

## 2017-02-04 DIAGNOSIS — I5022 Chronic systolic (congestive) heart failure: Secondary | ICD-10-CM | POA: Insufficient documentation

## 2017-02-04 DIAGNOSIS — I519 Heart disease, unspecified: Secondary | ICD-10-CM | POA: Diagnosis not present

## 2017-02-04 DIAGNOSIS — Z7982 Long term (current) use of aspirin: Secondary | ICD-10-CM | POA: Diagnosis not present

## 2017-02-04 DIAGNOSIS — F17210 Nicotine dependence, cigarettes, uncomplicated: Secondary | ICD-10-CM | POA: Insufficient documentation

## 2017-02-04 DIAGNOSIS — I493 Ventricular premature depolarization: Secondary | ICD-10-CM | POA: Insufficient documentation

## 2017-02-04 DIAGNOSIS — E785 Hyperlipidemia, unspecified: Secondary | ICD-10-CM | POA: Diagnosis not present

## 2017-02-04 DIAGNOSIS — Z7901 Long term (current) use of anticoagulants: Secondary | ICD-10-CM | POA: Diagnosis not present

## 2017-02-04 MED ORDER — ISOSORB DINITRATE-HYDRALAZINE 20-37.5 MG PO TABS: 1.0000 | ORAL_TABLET | Freq: Three times a day (TID) | ORAL | 6 refills | Status: DC

## 2017-02-04 MED ORDER — ASPIRIN EC 81 MG PO TBEC: 81.0000 mg | DELAYED_RELEASE_TABLET | Freq: Every day | ORAL | 6 refills | Status: DC

## 2017-02-04 NOTE — Patient Instructions (Signed)
STOP Coumadin.  START Aspirin 81 mg tablet once daily.  Bidil has been sent to [facility] for financial assistance. Address: [address] [place], [place] [address] Phone: [phone]  Follow up 6 months with Dr. Aundra Dubin. We will call you closer to this time, or you may call our office to schedule 1 month before you are due to be seen. Take all medication as prescribed the day of your appointment. Bring all medications with you to your appointment.  Do the following things EVERYDAY: 1) Weigh yourself in the morning before breakfast. Write it down and keep it in a log. 2) Take your medicines as prescribed 3) Eat low salt foods-Limit salt (sodium) to 2000 mg per day.  4) Stay as active as you can everyday 5) Limit all fluids for the day to less than 2 liters

## 2017-02-06 NOTE — Progress Notes (Signed)
Patient ID: Bryan Lamb, male   DOB: 1946-01-12, 71 y.o.   MRN: [id number] PCP: None  Primary Cardiologist: Dr Aundra Dubin INR: [facility].   HPI: Bryan Lamb is a 71 yo smoker with history of myocarditis, systolic heart failure, PVCs, apical blood stasis on coumadin, and former smoker (quit in February 2016).   Admitted to [facility] in 2/16 with dyspnea and found to have probable acute myocarditis. Had LHC and CMRI as noted below. Due to frequent PVCs and NSVT, he was discharged with a Lifevest. Started CHF meds. He refused ICD eventually and Lifevest was taken off.   I have not seen him for 2 years.  In the mean time, he has actually continued his cardiac meds.  He had a workup for amyloidosis => fat pad biopsy was negative for amyloidosis in 2016.  Given positive SPEP, he saw heme/onc and had bone marrow biopsy that was negative for amyloidosis and did not suggest multiple myeloma.  He had repeat myeloma panel done that was negative.  Conclusion was no myeloma, no AL amyloidosis, no MGUS.   He is being treated by endocrinology with methimazole for toxic multinodular goiter.    Symptomatically, he is doing well. Able to push mow his yard.  No exertional dyspnea. No chest pain.  No orthopnea/PND.  No palpitations or lightheadedness. Weight has remained stable. He is still smoking.   Repeat echo was done in 7/18, this showed EF up to normal range.    LHC (09/04/2014): No angiographic CAD. Cardiac MRI (09/05/2014 ): EF 15-20% diffuse hypokinesis, moderate RV systolic dysfunction, concern for early thrombus LV apex, mid inferior and peri-apical transmural LGE with mid-wall RV LGE => concern for acute myocarditis.  ECHO 08/08/2014 EF 20% LV moderately dilated.  ECG (3/16): NSR, LVH with marked anterolateral T wave inversions Echo (6/16): Moderately dilated LV with EF 25%, normal RV size with mildly decreased systolic function.  Echo (7/18): EF 55-60%, moderate LVH, trivial Bryan  Labs (3/16): K 3.9, creatinine 1.3, BNP 162,  HIV negative, ferritin 133, LDL 112, TSH 0.06, SPEP with monoclonal protein Labs (4/16): LFTs normal, free T3 normal, free T4 normal, TSH 0.187 (low), K 3.9, creatinine 1.03, LDL 60 Labs (5/16): K 4.5, creatinine 1.07, BNP 230 Labs (7/18): K 4.2, creatinine 0.96, NT-proBNP 500  ECG (personally reviewed): NSR, deep inferior and anterolateral T wave inversions  ROS: All systems negative except as listed in HPI, PMH and Problem List.  SH:  Social History   Social History  . Marital status: Married    Spouse name: N/A  . Number of children: N/A  . Years of education: N/A   Occupational History  . Not on file.   Social History Main Topics  . Smoking status: Heavy Tobacco Smoker    Packs/day: 1.00    Years: 52.00  . Smokeless tobacco: Never Used  . Alcohol use No     Comment: remote heavy ETOH, none for years  . Drug use: No  . Sexual activity: Not on file   Other Topics Concern  . Not on file   Social History Narrative   Pt lives in [place] with spouse.  Previously worked at [employer].  Retired.  2 grown daughters are healthy.    FH: No cardiomyopathy or premature CAD.   Past Medical History:  Diagnosis Date  . Chronic systolic dysfunction of left ventricle   . Nonischemic cardiomyopathy ([place])   . NSVT (nonsustained ventricular tachycardia) ([place])   . Urinary incontinence  Current Outpatient Prescriptions  Medication Sig Dispense Refill  . atorvastatin (LIPITOR) 40 MG tablet take 1 tablet by mouth once daily AT 6 PM 90 tablet 3  . carvedilol (COREG) 12.5 MG tablet Take 1.5 tablets (18.75 mg total) by mouth 2 (two) times daily with a meal. 270 tablet 3  . finasteride (PROSCAR) 5 MG tablet Take 5 mg by mouth daily.  0  . isosorbide-hydrALAZINE (BIDIL) 20-37.5 MG tablet Take 1 tablet by mouth 3 (three) times daily. 90 tablet 6  . methimazole (TAPAZOLE) 5 MG tablet take 1 tablet by mouth once daily 90 tablet 1  . nitroGLYCERIN (NITROSTAT) 0.4 MG SL  tablet Place 1 tablet (0.4 mg total) under the tongue every 5 (five) minutes x 3 doses as needed for chest pain. 25 tablet 12  . sacubitril-valsartan (ENTRESTO) 24-26 MG Take 1 tablet by mouth 2 (two) times daily. 60 tablet 3  . aspirin EC 81 MG tablet Take 1 tablet (81 mg total) by mouth daily. 30 tablet 6   No current facility-administered medications for this encounter.     Vitals:   02/04/17 1100  BP: 100/60  Pulse: 67  SpO2: 93%  Weight: 166 lb 1.9 oz (75.4 kg)    PHYSICAL EXAM: General: NAD Neck: No JVD, no thyromegaly or thyroid nodule.  Lungs: Clear to auscultation bilaterally with normal respiratory effort. CV: Nondisplaced PMI.  Heart regular S1/S2, no S3/S4, no murmur.  No peripheral edema.  No carotid bruit.  Normal pedal pulses.  Abdomen: Soft, nontender, no hepatosplenomegaly, no distention.  Skin: Intact without lesions or rashes.  Neurologic: Alert and oriented x 3.  Psych: Normal affect. Extremities: No clubbing or cyanosis.  HEENT: Normal.   ASSESSMENT & PLAN: 1. Chronic Systolic heart Failure: Nonischemic cardiomyopathy. Left heart cath 3/16 showed no angiographic CAD. Cardiac MRI (see description above) was concerning for acute myocarditis with T2 enhancement and multiple areas of mid-wall and transmural LGE. There was also concern for blood stasis/early thrombus in apex so warfarin started. EF 15-20% at that time. ECG and echo appearance not classic for amyloidosis, and there was late gadolinium enhancement on MRI but more in a myocarditis-type pattern.  However, SPEP showed a monoclonal protein => he had extensive evaluation with abdominal fat pad biopsy and bone marrow biopsy, neither showed amyloidosis.  Hematology evaluation was NOT suggestive of multiple myeloma, MGUS, or AL amyloidosis.  He was lost to followup but returns today.  Recent echo showed improvement in LV function to normal range with EF 55-60%.   - He is not on Lasix, does not appear to need a  diuretic.   - Continue Coreg 18.75 mg bid, Bidil 1 tab tid, Entresto 24/26 bid.  EF improve on this regimen so I would continue it.    2. Ventricular ectopy: EF now improved beyond ICD range.  3. 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  With recovery of LV function and no apical aneurysm, I do not think Bryan Wahlen needs to stay on warfarin.  - Can stop warfarin.  - Would start ASA 81 4. Hyperlipidemia: Continue statin, check lipids.  5. Toxic multinodular goiter: Followed by endocrinology.   Followup in 6 months.    Loralie Champagne 02/06/2017

## 2017-03-31 ENCOUNTER — Ambulatory Visit (INDEPENDENT_AMBULATORY_CARE_PROVIDER_SITE_OTHER): Payer: Medicare Other | Admitting: Physician Assistant

## 2017-03-31 ENCOUNTER — Encounter: Payer: Self-pay | Admitting: Physician Assistant

## 2017-03-31 VITALS — BP 122/72 | HR 80 | Temp 97.7°F | Resp 17 | Ht 67.0 in | Wt 169.0 lb

## 2017-03-31 DIAGNOSIS — R600 Localized edema: Secondary | ICD-10-CM

## 2017-03-31 DIAGNOSIS — L309 Dermatitis, unspecified: Secondary | ICD-10-CM | POA: Diagnosis not present

## 2017-03-31 NOTE — Progress Notes (Signed)
Subjective:    Patient ID: Bryan Lamb, male    DOB: 1946/06/12, 71 y.o.   MRN: [id number]  HPI  Patient presents as a new patient for evaluation of new onset bilateral leg swelling and itching. He states it began about 1 week ago. He thinks he got into "some poison ivy when mowing." He denies any symptoms like this in the past. Patient used rubbing alcohol on his legs which helped it feel better. He denied using any oral medication for relief. He notes that it no longer itches, but the swelling and flaking persists.  Patient states he is compliant with his medications and taking them daily. Of note, patient is a poor historian.  Review of Systems  Constitutional: Negative for fever.  Respiratory: Negative for shortness of breath.   Skin: Positive for color change and rash.       Swelling of legs   Prior to Admission medications   Medication Sig Start Date End Date Taking? Authorizing Provider  aspirin EC 81 MG tablet Take 1 tablet (81 mg total) by mouth daily. 02/04/17  Yes Larey Dresser, MD  atorvastatin (LIPITOR) 40 MG tablet take 1 tablet by mouth once daily AT 6 PM 12/17/16  Yes Allred, Jeneen Rinks, MD  carvedilol (COREG) 12.5 MG tablet Take 1.5 tablets (18.75 mg total) by mouth 2 (two) times daily with a meal. 02/01/17  Yes Allred, Jeneen Rinks, MD  finasteride (PROSCAR) 5 MG tablet Take 5 mg by mouth daily. 07/05/14  Yes [provider]  isosorbide-hydrALAZINE (BIDIL) 20-37.5 MG tablet Take 1 tablet by mouth 3 (three) times daily. 02/04/17  Yes Larey Dresser, MD  methimazole (TAPAZOLE) 5 MG tablet take 1 tablet by mouth once daily 01/12/17  Yes Philemon Kingdom, MD  nitroGLYCERIN (NITROSTAT) 0.4 MG SL tablet Place 1 tablet (0.4 mg total) under the tongue every 5 (five) minutes x 3 doses as needed for chest pain. 09/07/14  Yes Barrett, Evelene Croon, PA-C   No Known Allergies Social History   Social History  . Marital status: Married    Spouse name: Visua  . Number of children: 2  .  Years of education: 10th grade   Occupational History  . 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 Smoking status: Heavy Tobacco Smoker    Packs/day: 1.00    Years: 52.00  . Smokeless tobacco: Never Used  . Alcohol use No     Comment: remote heavy ETOH, none for years  . Drug use: No  . Sexual activity: Not on file   Other Topics Concern  . Not on file   Social History Narrative   Pt lives in [place] with spouse.  Previously worked at [employer].  Retired.  2 grown daughters are healthy.   Patient Active Problem List   Diagnosis Date Noted  . Hypertension 02/26/2015  . Monoclonal gammopathy 01/14/2015  . Amyloidosis ([place])   . 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 Chronic anticoagulation 09/13/2014  . Encounter for therapeutic drug monitoring 09/10/2014  . Acute myocarditis 09/07/2014  . Nonischemic dilated cardiomyopathy ([place]) 09/05/2014  . Acute systolic CHF (congestive heart failure) ([place]) 09/05/2014  . Tobacco abuse 09/04/2014  . Dyslipidemia 09/04/2014  . Elevated troponin 09/03/2014  . Urinary incontinence        Objective:   Physical Exam  Constitutional: He is oriented to person, place, and time. He appears well-developed.  HENT:  Missing teeth  Cardiovascular: Normal  rate, regular rhythm and normal heart sounds.   Pulmonary/Chest: Effort normal. He has rhonchi in the right lower field and the left lower field.  Neurological: He is alert and oriented to person, place, and time.  Skin:         Assessment & Plan:  1. Dermatitis 2. Bilateral lower extremity edema - Instructed patient to keep the legs clean and dry. Moisturize with emollient at least twice a day.  - Keep legs elevated as much as possible to reduce swelling.  - Follow up in one week, sooner if swelling worsens, develops pain, redness, or fever.  Respectfully, Denny Levy PA-S 2019

## 2017-03-31 NOTE — Progress Notes (Signed)
Patient ID: Bryan Lamb, male     DOB: Sep 18, 1945, 71 y.o.    MRN: [id number]  PCP: Patient, No Pcp Per  Patient Care Team: Patient, No Pcp Per as PCP - General (General Practice) Larey Dresser, MD as Consulting Physician (Cardiology) Philemon Kingdom, MD as Consulting Physician (Internal Medicine) Thompson Grayer, MD as Consulting Physician (Cardiology) Curt Bears, MD as Consulting Physician (Oncology) Franchot Gallo, MD as Consulting Physician (Urology)  Chief Complaint  Patient presents with  . Rash    Subjective:   This patient is new to this practice and presents for evaluation of possible poison ivy. He is accompanied by his wife, who is known to me. He is a poor historian, and so his wife helps in relating the specifics.  He developed a rash, itching and swelling of both lower legs about 1 week ago following mowing the yard. He has been applying rubbing alcohol to help soothe the itching, but has taken no oral medications nor used other topical products. The itching has resolved. The swelling nearly resolves overnight with lying down, but recurs by late morning when he is up and about.  He denies CP, SOB, dizziness, orthopnea. No fever, chills, nausea, vomiting, diarrhea.   Review of Systems As above.  Prior to Admission medications   Medication Sig Start Date End Date Taking? Authorizing Provider  aspirin EC 81 MG tablet Take 1 tablet (81 mg total) by mouth daily. 02/04/17  Yes Larey Dresser, MD  atorvastatin (LIPITOR) 40 MG tablet take 1 tablet by mouth once daily AT 6 PM 12/17/16  Yes Allred, Jeneen Rinks, MD  carvedilol (COREG) 12.5 MG tablet Take 1.5 tablets (18.75 mg total) by mouth 2 (two) times daily with a meal. 02/01/17  Yes Allred, Jeneen Rinks, MD  finasteride (PROSCAR) 5 MG tablet Take 5 mg by mouth daily. 07/05/14  Yes [provider]  isosorbide-hydrALAZINE (BIDIL) 20-37.5 MG tablet Take 1 tablet by mouth 3 (three) times daily. 02/04/17  Yes  Larey Dresser, MD  methimazole (TAPAZOLE) 5 MG tablet take 1 tablet by mouth once daily 01/12/17  Yes Philemon Kingdom, MD  nitroGLYCERIN (NITROSTAT) 0.4 MG SL tablet Place 1 tablet (0.4 mg total) under the tongue every 5 (five) minutes x 3 doses as needed for chest pain. 09/07/14  Yes Barrett, Evelene Croon, PA-C     No Known Allergies   Patient Active Problem List   Diagnosis Date Noted  . Hypertension 02/26/2015  . Monoclonal gammopathy 01/14/2015  . Amyloidosis ([place])   . 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 Chronic anticoagulation 09/13/2014  . Encounter for therapeutic drug monitoring 09/10/2014  . Acute myocarditis 09/07/2014  . 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 Acute systolic CHF (congestive heart failure) ([place]) 09/05/2014  . Tobacco abuse 09/04/2014  . Dyslipidemia 09/04/2014  . Elevated troponin 09/03/2014  . Urinary incontinence      Family History  Problem Relation Age of Onset  . Heart disease Unknown   . Stroke Unknown      Social History   Social History  . Marital status: Married    Spouse name: Visua  . Number of children: 2  . Years of education: 10th grade   Occupational History  . 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 Smoking status: Heavy Tobacco Smoker    Packs/day: 1.00    Years: 52.00  . Smokeless tobacco: Never Used  . Alcohol use No  Comment: remote heavy ETOH, none for years  . Drug use: No  . Sexual activity: Not on file   Other Topics Concern  . Not on file   Social History Narrative   Pt lives in [place] with spouse.  Previously worked at [employer].  Retired.  2 grown daughters are healthy.         Objective:  Physical Exam  Constitutional: He is oriented to person, place, and time. He appears well-developed and well-nourished. He is active and cooperative. No distress.  BP 122/72   Pulse 80   Temp 97.7 F (36.5 C) (Oral)    Resp 17   Ht 5\' 7"  (1.702 m)   Wt 169 lb (76.7 kg)   SpO2 98%   BMI 26.47 kg/m   HENT:  Head: Normocephalic and atraumatic.  Right Ear: Hearing normal.  Left Ear: Hearing normal.  Eyes: Conjunctivae are normal. No scleral icterus.  Neck: Normal range of motion. Neck supple. No thyromegaly present.  Cardiovascular: Normal rate, regular rhythm and normal heart sounds.   Pulses:      Radial pulses are 2+ on the right side, and 2+ on the left side.       Dorsalis pedis pulses are 2+ on the right side, and 2+ on the left side.       Posterior tibial pulses are 2+ on the right side, and 2+ on the left side.  Pulmonary/Chest: Effort normal. He has no decreased breath sounds. He has no wheezes. He has no rhonchi. He has no rales.  Breath sounds are coarse in both bases.  Lymphadenopathy:       Head (right side): No tonsillar, no preauricular, no posterior auricular and no occipital adenopathy present.       Head (left side): No tonsillar, no preauricular, no posterior auricular and no occipital adenopathy present.    He has no cervical adenopathy.       Right: No supraclavicular adenopathy present.       Left: No supraclavicular adenopathy present.  Neurological: He is alert and oriented to person, place, and time. No sensory deficit.  Skin: Skin is warm, dry and intact. No cyanosis or erythema. Nails show no clubbing.  Both lower legs with circumferential crusted plaques with a thick gray scale. Scattered areas are weeping serous fluid. No erythema. No induration of skin free of scale. 1+ pitting edema. Non-tender.  Psychiatric: He has a normal mood and affect. His speech is normal and behavior is normal.       Assessment & Plan:  1. Dermatitis 2. Bilateral lower extremity edema This certainly could represent a severe reaction/contact dermatitis to Rhus species plants. Itching has resolved and there is no pain. No indication for oral steroids or antibiotics at this point. Elevate legs.  Wash daily with soap and water, dry thoroughly. Apply emollient 2+ times daily.     Return in about 1 week (around 04/07/2017) for re-evaluation of rash. Sooner if symptoms worsen, develops pain, redness or fever.   Fara Chute, PA-C [facility] at [facility]

## 2017-03-31 NOTE — Patient Instructions (Addendum)
Elevate your legs as much as you can.  Apply an emollient at least twice a day all over, especially the legs and feet. I like Aquaphor, Moisturel, Lubriderm and Cetaphil. Creams are better than lotions.  If you develop MORE swelling, any redness, pain, fever, then COME BACK RIGHT AWAY.   IF you received an x-ray today, you will receive an invoice from [facility]. Please contact [facility] at [phone] with questions or concerns regarding your invoice.   IF you received labwork today, you will receive an invoice from [facility]. Please contact [facility] at [phone] with questions or concerns regarding your invoice.   Our billing staff will not be able to assist you with questions regarding bills from these companies.  You will be contacted with the lab results as soon as they are available. The fastest way to get your results is to activate your My Chart account. Instructions are located on the last page of this paperwork. If you have not heard from [place] regarding the results in 2 weeks, please contact this office.

## 2017-04-09 ENCOUNTER — Telehealth: Payer: Self-pay | Admitting: Physician Assistant

## 2017-04-09 ENCOUNTER — Ambulatory Visit (INDEPENDENT_AMBULATORY_CARE_PROVIDER_SITE_OTHER): Payer: Medicare Other | Admitting: Physician Assistant

## 2017-04-09 ENCOUNTER — Encounter (HOSPITAL_COMMUNITY): Payer: Self-pay | Admitting: Emergency Medicine

## 2017-04-09 ENCOUNTER — Encounter: Payer: Self-pay | Admitting: Physician Assistant

## 2017-04-09 ENCOUNTER — Emergency Department (HOSPITAL_COMMUNITY)
Admission: EM | Admit: 2017-04-09 | Discharge: 2017-04-09 | Disposition: A | Discharge status: Home / Self Care | Payer: Medicare Other | Attending: Emergency Medicine | Admitting: Emergency Medicine

## 2017-04-09 ENCOUNTER — Ambulatory Visit (HOSPITAL_BASED_OUTPATIENT_CLINIC_OR_DEPARTMENT_OTHER)
Admission: RE | Admit: 2017-04-09 | Discharge: 2017-04-09 | Disposition: A | Discharge status: Home / Self Care | Payer: Medicare Other | Source: Ambulatory Visit | Attending: Physician Assistant | Admitting: Physician Assistant

## 2017-04-09 VITALS — BP 118/70 | HR 69 | Temp 98.6°F | Resp 16 | Ht 67.0 in | Wt 170.2 lb

## 2017-04-09 DIAGNOSIS — E0590 Thyrotoxicosis, unspecified without thyrotoxic crisis or storm: Secondary | ICD-10-CM | POA: Diagnosis not present

## 2017-04-09 DIAGNOSIS — I110 Hypertensive heart disease with heart failure: Secondary | ICD-10-CM | POA: Insufficient documentation

## 2017-04-09 DIAGNOSIS — Z79899 Other long term (current) drug therapy: Secondary | ICD-10-CM | POA: Diagnosis not present

## 2017-04-09 DIAGNOSIS — Z23 Encounter for immunization: Secondary | ICD-10-CM | POA: Diagnosis not present

## 2017-04-09 DIAGNOSIS — M7989 Other specified soft tissue disorders: Secondary | ICD-10-CM | POA: Insufficient documentation

## 2017-04-09 DIAGNOSIS — I5022 Chronic systolic (congestive) heart failure: Secondary | ICD-10-CM | POA: Insufficient documentation

## 2017-04-09 DIAGNOSIS — I82411 Acute embolism and thrombosis of right femoral vein: Secondary | ICD-10-CM | POA: Insufficient documentation

## 2017-04-09 DIAGNOSIS — L309 Dermatitis, unspecified: Secondary | ICD-10-CM

## 2017-04-09 DIAGNOSIS — F17200 Nicotine dependence, unspecified, uncomplicated: Secondary | ICD-10-CM | POA: Insufficient documentation

## 2017-04-09 DIAGNOSIS — Z7982 Long term (current) use of aspirin: Secondary | ICD-10-CM | POA: Insufficient documentation

## 2017-04-09 DIAGNOSIS — R600 Localized edema: Secondary | ICD-10-CM

## 2017-04-09 DIAGNOSIS — L03116 Cellulitis of left lower limb: Secondary | ICD-10-CM | POA: Diagnosis not present

## 2017-04-09 DIAGNOSIS — R2243 Localized swelling, mass and lump, lower limb, bilateral: Secondary | ICD-10-CM | POA: Diagnosis present

## 2017-04-09 HISTORY — DX: Heart failure, unspecified: I50.9

## 2017-04-09 MED ORDER — RIVAROXABAN (XARELTO) VTE STARTER PACK (15 & 20 MG): ORAL_TABLET | ORAL | 0 refills | Status: DC

## 2017-04-09 MED ORDER — DOXYCYCLINE HYCLATE 100 MG PO CAPS: 100.0000 mg | ORAL_CAPSULE | Freq: Two times a day (BID) | ORAL | 0 refills | Status: DC

## 2017-04-09 NOTE — Progress Notes (Signed)
Subjective:    Patient ID: Bryan Lamb, male    DOB: 1945-12-02, 71 y.o.   MRN: [id number]  HPI  Chief Complaint  Patient presents with  . Follow-up    Patient presents to office to recheck rash on legs. Patient states that legs are now swollen, denies any pain   Patient returns today for re-evaluation of the rash on his bilateral legs. He states that his legs continue to be swollen, LEFT greater than RIGHT. "Other than the swelling, I am doing alright." He reports the rash has improved with moisturizing. He denies any pain or itching of the legs. He denies chest pain, SOB, palpitations, cough, headaches, dizziness, syncope, orthopnea, or PND.   Echo from 01/04/2017 showed EF of 55-60%. Compared to a prior study in 2016, the LVEF has normalized. History of nonischemic dilated cardiomyopathy, acute systolic heart failure, and acute myocarditis.   Wt Readings from Last 3 Encounters:  04/09/17 170 lb 3.2 oz (77.2 kg)  03/31/17 169 lb (76.7 kg)  02/04/17 166 lb 1.9 oz (75.4 kg)   Review of Systems As above.  Patient Active Problem List   Diagnosis Date Noted  . Hypertension 02/26/2015  . Monoclonal gammopathy 01/14/2015  . Amyloidosis ([place])   . 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 Chronic anticoagulation 09/13/2014  . Encounter for therapeutic drug monitoring 09/10/2014  . Acute myocarditis 09/07/2014  . 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 Acute systolic CHF (congestive heart failure) ([place]) 09/05/2014  . Tobacco abuse 09/04/2014  . Dyslipidemia 09/04/2014  . Elevated troponin 09/03/2014  . Urinary incontinence    Prior to Admission medications   Medication Sig Start Date End Date Taking? Authorizing Provider  aspirin EC 81 MG tablet Take 1 tablet (81 mg total) by mouth daily. 02/04/17  Yes Larey Dresser, MD  atorvastatin (LIPITOR) 40 MG tablet take 1 tablet by mouth once daily AT 6 PM 12/17/16  Yes Allred, Jeneen Rinks, MD  carvedilol (COREG) 12.5 MG tablet  Take 1.5 tablets (18.75 mg total) by mouth 2 (two) times daily with a meal. 02/01/17  Yes Allred, Jeneen Rinks, MD  finasteride (PROSCAR) 5 MG tablet Take 5 mg by mouth daily. 07/05/14  Yes [provider]  isosorbide-hydrALAZINE (BIDIL) 20-37.5 MG tablet Take 1 tablet by mouth 3 (three) times daily. 02/04/17  Yes Larey Dresser, MD  methimazole (TAPAZOLE) 5 MG tablet take 1 tablet by mouth once daily 01/12/17  Yes Philemon Kingdom, MD  nitroGLYCERIN (NITROSTAT) 0.4 MG SL tablet Place 1 tablet (0.4 mg total) under the tongue every 5 (five) minutes x 3 doses as needed for chest pain. 09/07/14  Yes Barrett, Evelene Croon, PA-C   No Known Allergies Social History   Social History  . Marital status: Married    Spouse name: Visua  . Number of children: 2  . Years of education: 10th grade   Occupational History  . 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 Smoking status: Heavy Tobacco Smoker    Packs/day: 1.00    Years: 52.00  . Smokeless tobacco: Never Used  . Alcohol use No     Comment: remote heavy ETOH, none for years  . Drug use: No  . Sexual activity: Not on file   Other Topics Concern  . Not on file   Social History Narrative   Pt lives in [place] with spouse.  Previously worked at [employer].  Retired.  2 grown daughters  are healthy.      Objective:   Physical Exam  Constitutional: He is oriented to person, place, and time. He appears well-developed and well-nourished. No distress.  Cardiovascular: Normal rate, regular rhythm, normal heart sounds and intact distal pulses.  Exam reveals no gallop and no friction rub.   No murmur heard. Pulmonary/Chest: Effort normal and breath sounds normal. No respiratory distress. He has no wheezes. He has no rales. He exhibits no tenderness.  Neurological: He is alert and oriented to person, place, and time.  Skin: Rash noted. He is not diaphoretic.         Assessment &  Plan:  1. Bilateral lower extremity edema - VAS [place] LOWER EXTREMITY VENOUS (DVT); Future  2. Dermatitis - Healing rash, continue to moisturize  3. Need for influenza vaccination - Patient declined today.   Respectfully, Denny Levy PA-S 2019  Addendum: Radiology called and notified us that patient has acute DVT of the RIGHT leg (even though swelling was greater on the LEFT leg). Patient was instructed to report to the ED. Radiology tech escorted patient and his wife. Charge nurse at ED was notified of patient's arrival.

## 2017-04-09 NOTE — Telephone Encounter (Signed)
Call from Radiology.  Patient's dopplers notable for DVT on the RIGHT (not the LEFT, as anticipated). Tech to take patient and wife to the ED, for additional evaluation/treatment initiation for DVT. Triage nurse, Cassie, notified.  Suspect that the edema is due to decompensating heart failure as he has been OFF Entresto since his last visit with cardiology (he was to continue it, but may have misunderstood). Likely needs outpatient follow-up with cardiology. Would be happy to start diuretic here until he can get in to cardiology.

## 2017-04-09 NOTE — Progress Notes (Signed)
Patient ID: Bryan Lamb, male    DOB: Oct 18, 1945, 71 y.o.   MRN: [id number]  PCP: Patient, No Pcp Per  Chief Complaint  Patient presents with  . Follow-up    Patient presents to office to recheck rash on legs. Patient states that legs are now swollen, denies any pain    Subjective:   Presents for evaluation of bilateral LE edema and dermatitis. He is accompanied by his wife.  I saw him 03/31/2017 for this problem. The symptoms had developed the previous week following mowing the yard, and he thought he had poison ivy. At the time, there was no presacral edema, no CP, SOB, DOE or orthopnea. He was advised to elevate and moisturize the legs and to return today for re-evaluation.  He reports he is doing better overall, that the swelling is reduced and the dermatitis is improving, though not resolved. He continues to deny CP, SOB, DOE and orthopnea.  He had a visit with cardiology 02/04/2017, with a history of myocarditis, systolic heart failure. Echocardiogram 01/2017 revealed a normal EF. At the visit, diuretic was not prescribed, as he appeared euvolemic. He was to continue carvedilol, Bidil and Entresto, warfarin was discontinued and he was to remain on ASA 81 mg. He is no longer on Entresto, he is under the impression that it was discontinued.  Review of Systems As above.    Patient Active Problem List   Diagnosis Date Noted  . Hypertension 02/26/2015  . Monoclonal gammopathy 01/14/2015  . Amyloidosis ([place])   . 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 Chronic anticoagulation 09/13/2014  . Encounter for therapeutic drug monitoring 09/10/2014  . Acute myocarditis 09/07/2014  . 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 Acute systolic CHF (congestive heart failure) ([place]) 09/05/2014  . Tobacco abuse 09/04/2014  . Dyslipidemia 09/04/2014  . Elevated troponin 09/03/2014  . Urinary incontinence      Prior to Admission medications   Medication Sig Start Date  End Date Taking? Authorizing Provider  aspirin EC 81 MG tablet Take 1 tablet (81 mg total) by mouth daily. 02/04/17  Yes Larey Dresser, MD  atorvastatin (LIPITOR) 40 MG tablet take 1 tablet by mouth once daily AT 6 PM 12/17/16  Yes Allred, Jeneen Rinks, MD  carvedilol (COREG) 12.5 MG tablet Take 1.5 tablets (18.75 mg total) by mouth 2 (two) times daily with a meal. 02/01/17  Yes Allred, Jeneen Rinks, MD  finasteride (PROSCAR) 5 MG tablet Take 5 mg by mouth daily. 07/05/14  Yes [provider]  isosorbide-hydrALAZINE (BIDIL) 20-37.5 MG tablet Take 1 tablet by mouth 3 (three) times daily. 02/04/17  Yes Larey Dresser, MD  methimazole (TAPAZOLE) 5 MG tablet take 1 tablet by mouth once daily 01/12/17  Yes Philemon Kingdom, MD  nitroGLYCERIN (NITROSTAT) 0.4 MG SL tablet Place 1 tablet (0.4 mg total) under the tongue every 5 (five) minutes x 3 doses as needed for chest pain. 09/07/14  Yes Barrett, Rhonda G, PA-C     No Known Allergies     Objective:  Physical Exam  Constitutional: He is oriented to person, place, and time. He appears well-developed and well-nourished. He is active and cooperative. No distress.  BP 118/70 (BP Location: Left Arm, Patient Position: Sitting, Cuff Size: Normal)   Pulse 69   Temp 98.6 F (37 C) (Oral)   Resp 16   Ht 5\' 7"  (1.702 m)   Wt 170 lb 3.2 oz (77.2 kg)   SpO2 97%   BMI 26.66 kg/m  HENT:  Head: Normocephalic and atraumatic.  Right Ear: Hearing normal.  Left Ear: Hearing normal.  Eyes: Conjunctivae are normal. No scleral icterus.  Neck: Normal range of motion. Neck supple. No thyromegaly present.  Cardiovascular: Normal rate, regular rhythm and normal heart sounds.   No murmur heard. Pulses:      Radial pulses are 2+ on the right side, and 2+ on the left side.  LLE 1-2+ to the upper anterior tibia. RLE trace-1+ to the mid anterior tibia. No presacral edema.  Pulmonary/Chest: Effort normal and breath sounds normal.  Lymphadenopathy:       Head (right  side): No tonsillar, no preauricular, no posterior auricular and no occipital adenopathy present.       Head (left side): No tonsillar, no preauricular, no posterior auricular and no occipital adenopathy present.    He has no cervical adenopathy.       Right: No supraclavicular adenopathy present.       Left: No supraclavicular adenopathy present.  Neurological: He is alert and oriented to person, place, and time. No sensory deficit.  Skin: Skin is warm, dry and intact. No rash noted. No cyanosis or erythema. Nails show no clubbing.  Psychiatric: He has a normal mood and affect. His speech is normal and behavior is normal.   Wt Readings from Last 3 Encounters:  04/09/17 170 lb 3.2 oz (77.2 kg)  03/31/17 169 lb (76.7 kg)  02/04/17 166 lb 1.9 oz (75.4 kg)        Assessment & Plan:   1. Bilateral lower extremity edema He is at higher risk for DVT, so bilateral LE dopplers today. If results are NEGATIVE, will order diuretic and plan re-evaluation in 1 week. Likely needs to resume Entresto. May need follow-up with cardiology. - VAS [place] LOWER EXTREMITY VENOUS (DVT); Future  2. Dermatitis Likely due to LE edema. Improving.  3. Need for influenza vaccination Refused.    Return pending doppler results.   Fara Chute, PA-C [facility] at [place]

## 2017-04-09 NOTE — Patient Instructions (Addendum)
[place] scheduled for 4pm at [facility], arrive at 3:45pm  If the ultrasound shows NO BLOOD CLOT, then I'll send in a prescription for a fluid pill to help reduce the swelling.    IF you received an x-ray today, you will receive an invoice from [facility]. Please contact [facility] at [phone] with questions or concerns regarding your invoice.   IF you received labwork today, you will receive an invoice from [place]. Please contact [facility] at [phone] with questions or concerns regarding your invoice.   Our billing staff will not be able to assist you with questions regarding bills from these companies.  You will be contacted with the lab results as soon as they are available. The fastest way to get your results is to activate your My Chart account. Instructions are located on the last page of this paperwork. If you have not heard from [place] regarding the results in 2 weeks, please contact this office.

## 2017-04-09 NOTE — Progress Notes (Signed)
**  Preliminary report by tech**  Bilateral lower extremity venous duplex complete. There is evidence of acute intramuscular deep vein thrombosis involving the right lower extremity. There is no evidence of superficial vein thrombosis involving the right lower extremity. There is no evidence of deep or superficial vein thrombosis involving the left lower extremity. There is no evidence of a Baker's cyst bilaterally. Results were given to [facility] PA.  04/09/17 4:26 PM Carlos Levering RVT

## 2017-04-09 NOTE — ED Provider Notes (Signed)
[facility] DEPT Provider Note   CSN: [id number] Arrival date & time: 04/09/17  1640     History   Chief Complaint Chief Complaint  Patient presents with  . Leg Swelling    HPI Bryan Lamb is a 71 y.o. male.  71 yo F with a chief complaint of bilateral lower extremity edema. This is been an ongoing issue. Started having pain to his left lower extremity. Had an outpatient DVT study that showed that he had a right lower extremity DVT. He has been recently treated for contact dermatitis secondary to poison ivy to bilateral lower extremities. He denies fevers or chills. Pain is worse with movement palpation walking. Denies trauma.   The history is provided by the patient.  Illness  This is a new problem. The current episode started less than 1 hour ago. The problem occurs constantly. The problem has not changed since onset.Pertinent negatives include no chest pain, no abdominal pain, no headaches and no shortness of breath. Nothing aggravates the symptoms. Nothing relieves the symptoms. He has tried nothing for the symptoms. The treatment provided no relief.    Past Medical History:  Diagnosis Date  . CHF (congestive heart failure) ([place])   . Chronic systolic dysfunction of left ventricle   . Nonischemic cardiomyopathy ([place])   . NSVT (nonsustained ventricular tachycardia) ([place])   . Urinary incontinence     Patient Active Problem List   Diagnosis Date Noted  . Hypertension 02/26/2015  . Monoclonal gammopathy 01/14/2015  . Amyloidosis ([place])   . 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 Chronic anticoagulation 09/13/2014  . Encounter for therapeutic drug monitoring 09/10/2014  . Acute myocarditis 09/07/2014  . 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 Acute systolic CHF (congestive heart failure) ([place]) 09/05/2014  . Tobacco abuse 09/04/2014  . Dyslipidemia 09/04/2014  . Elevated troponin 09/03/2014  . Urinary incontinence     Past Surgical History:    Procedure Laterality Date  . ANKLE FRACTURE SURGERY    . KNEE SURGERY    . LEFT HEART CATHETERIZATION WITH CORONARY ANGIOGRAM N/A 09/04/2014   Procedure: LEFT HEART CATHETERIZATION WITH CORONARY ANGIOGRAM;  Surgeon: Sanda Klein, MD;  Location: [facility] CATH LAB;  Service: Cardiovascular;  Laterality: N/A;       Home Medications    Prior to Admission medications   Medication Sig Start Date End Date Taking? Authorizing Provider  aspirin EC 81 MG tablet Take 1 tablet (81 mg total) by mouth daily. 02/04/17   Larey Dresser, MD  atorvastatin (LIPITOR) 40 MG tablet take 1 tablet by mouth once daily AT 6 PM 12/17/16   Allred, Jeneen Rinks, MD  carvedilol (COREG) 12.5 MG tablet Take 1.5 tablets (18.75 mg total) by mouth 2 (two) times daily with a meal. 02/01/17   Allred, Jeneen Rinks, MD  doxycycline (VIBRAMYCIN) 100 MG capsule Take 1 capsule (100 mg total) by mouth 2 (two) times daily. 04/09/17   Deno Etienne, DO  finasteride (PROSCAR) 5 MG tablet Take 5 mg by mouth daily. 07/05/14   [provider]  isosorbide-hydrALAZINE (BIDIL) 20-37.5 MG tablet Take 1 tablet by mouth 3 (three) times daily. 02/04/17   Larey Dresser, MD  methimazole (TAPAZOLE) 5 MG tablet take 1 tablet by mouth once daily 01/12/17   Philemon Kingdom, MD  nitroGLYCERIN (NITROSTAT) 0.4 MG SL tablet Place 1 tablet (0.4 mg total) under the tongue every 5 (five) minutes x 3 doses as needed for chest pain. 09/07/14   Barrett, Evelene Croon, PA-C  Rivaroxaban 15 & 20 MG  TBPK Take as directed on package: Start with one 15mg  tablet by mouth twice a day with food. On Day 22, switch to one 20mg  tablet once a day with food. 04/09/17   Deno Etienne, DO    Family History Family History  Problem Relation Age of Onset  . Heart disease Unknown   . Stroke Unknown   . Cancer Sister        breast cancer    Social History Social History  Substance Use Topics  . Smoking status: Heavy Tobacco Smoker    Packs/day: 1.00    Years: 52.00  . Smokeless tobacco:  Never Used  . Alcohol use No     Comment: remote heavy ETOH, none for years     Allergies   Patient has no known allergies.   Review of Systems Review of Systems  Constitutional: Negative for chills and fever.  HENT: Negative for congestion and facial swelling.   Eyes: Negative for discharge and visual disturbance.  Respiratory: Negative for shortness of breath.   Cardiovascular: Positive for leg swelling. Negative for chest pain and palpitations.  Gastrointestinal: Negative for abdominal pain, diarrhea and vomiting.  Musculoskeletal: Negative for arthralgias and myalgias.  Skin: Positive for color change. Negative for rash.  Neurological: Negative for tremors, syncope and headaches.  Psychiatric/Behavioral: Negative for confusion and dysphoric mood.     Physical Exam Updated Vital Signs BP (!) 145/70   Pulse 61   Temp 98.3 F (36.8 C) (Oral)   Resp 17   Ht 5\' 7"  (1.702 m)   Wt 77.1 kg (170 lb)   SpO2 100%   BMI 26.63 kg/m   Physical Exam  Constitutional: He is oriented to person, place, and time. He appears well-developed and well-nourished.  HENT:  Head: Normocephalic and atraumatic.  Eyes: Pupils are equal, round, and reactive to light. EOM are normal.  Neck: Normal range of motion. Neck supple. No JVD present.  Cardiovascular: Normal rate and regular rhythm.  Exam reveals no gallop and no friction rub.   No murmur heard. Pulmonary/Chest: No respiratory distress. He has no wheezes.  Abdominal: He exhibits no distension and no mass. There is no tenderness. There is no rebound and no guarding.  Musculoskeletal: Normal range of motion. He exhibits edema and tenderness.  Bilateral lower extremity edema worse on the left than the right. Pulse motor and sensation is intact bilateral lower extremities.Left lower extremity with some warmth and erythema.  Neurological: He is alert and oriented to person, place, and time.  Skin: No rash noted. No pallor.  Psychiatric: He  has a normal mood and affect. His behavior is normal.  Nursing note and vitals reviewed.    ED Treatments / Results  Labs (all labs ordered are listed, but only abnormal results are displayed) Labs Reviewed - No data to display  EKG  EKG Interpretation None       Radiology No results found.  Procedures Procedures (including critical care time)  Medications Ordered in ED Medications - No data to display   Initial Impression / Assessment and Plan / ED Course  I have reviewed the triage vital signs and the nursing notes.  Pertinent labs & imaging results that were available during my care of the patient were reviewed by me and considered in my medical decision making (see chart for details).     71 yo F with a chief complaints of left lower extremity edema and pain. This is negative for DVT but is positive on the  other side. Denies any chest pain or shortness of breath. Left lower extremity erythema and pain I think this is likely cellulitis. Will treat with antibiotics. Start on xarelto PCP follow-up.  10:59 PM:  I have discussed the diagnosis/risks/treatment options with the patient and family and believe the pt to be eligible for discharge home to follow-up with PCP. We also discussed returning to the ED immediately if new or worsening sx occur. We discussed the sx which are most concerning (e.g., sudden worsening pain, fever, inability to tolerate by mouth) that necessitate immediate return. Medications administered to the patient during their visit and any new prescriptions provided to the patient are listed below.  Medications given during this visit Medications - No data to display   The patient appears reasonably screen and/or stabilized for discharge and I doubt any other medical condition or other [facility] requiring further screening, evaluation, or treatment in the ED at this time prior to discharge.    Final Clinical Impressions(s) / ED Diagnoses   Final diagnoses:    Acute deep vein thrombosis (DVT) of femoral vein of right lower extremity (HCC)  Cellulitis of left lower extremity    New Prescriptions Discharge Medication List as of 04/09/2017  9:11 PM    START taking these medications   Details  doxycycline (VIBRAMYCIN) 100 MG capsule Take 1 capsule (100 mg total) by mouth 2 (two) times daily., Starting Fri 04/09/2017, Print    Rivaroxaban 15 & 20 MG TBPK Take as directed on package: Start with one 15mg  tablet by mouth twice a day with food. On Day 22, switch to one 20mg  tablet once a day with food., Print         Deno Etienne, DO 04/09/17 2259

## 2017-04-09 NOTE — ED Triage Notes (Signed)
Pt sent by doctor for DVT in left leg. Pt c/o bilateral leg swelling, denies pain. Hx CHF.

## 2017-04-14 ENCOUNTER — Telehealth (HOSPITAL_COMMUNITY): Payer: Self-pay | Admitting: Cardiology

## 2017-04-14 NOTE — Telephone Encounter (Signed)
Left message for patient to call back on mobile number listed as priority.  When I called home number, no answer and no machine.  Per staff message from Kevan Rosebush, RN, pt needs appt with Dr. Aundra Dubin or on APP side, whichever we can schedule sooner.

## 2017-04-15 ENCOUNTER — Encounter: Payer: Self-pay | Admitting: Internal Medicine

## 2017-04-15 ENCOUNTER — Ambulatory Visit (INDEPENDENT_AMBULATORY_CARE_PROVIDER_SITE_OTHER): Payer: Medicare Other | Admitting: Internal Medicine

## 2017-04-15 VITALS — BP 134/82 | HR 61 | Wt 175.0 lb

## 2017-04-15 DIAGNOSIS — E0590 Thyrotoxicosis, unspecified without thyrotoxic crisis or storm: Secondary | ICD-10-CM | POA: Diagnosis not present

## 2017-04-15 DIAGNOSIS — E0520 Thyrotoxicosis with toxic multinodular goiter without thyrotoxic crisis or storm: Secondary | ICD-10-CM | POA: Diagnosis not present

## 2017-04-15 LAB — TSH: TSH: 1.72 u[IU]/mL (ref 0.35–4.50)

## 2017-04-15 LAB — T4, FREE: FREE T4: 0.96 ng/dL (ref 0.60–1.60)

## 2017-04-15 LAB — T3, FREE: T3, Free: 3 pg/mL (ref 2.3–4.2)

## 2017-04-15 NOTE — Addendum Note (Signed)
Addended by: Philemon Kingdom on: 04/15/2017 04:59 PM   Modules accepted: Orders

## 2017-04-15 NOTE — Patient Instructions (Signed)
Please continue Methimazole 5 mg daily.  Please stop at the lab.  Please come back for labs in 6 months and for a new visit in 1 year.  

## 2017-04-15 NOTE — Progress Notes (Addendum)
Patient ID: Bryan Lamb, male   DOB: [birth year], 71 y.o.   MRN: [id number]   HPI  Bryan Lamb is a 71 y.o.-year-old male, initially referred by his cardiologist, Dr. 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 Last visit 6 mo ago. He is here with his wife, who offers part of the hx especially related to [place] dosing and his sxs.  He had a DVT 04/09/2017. Also, cellulitis L leg.   Reviewed hx: His low TSH was discovered during investigation for heart ds.  Pt has a AMI in 09/03/2014. 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 He is followed by Dr. Rayann Heman.  He had a 2D Echo on 12/06/2014:  - Moderately dilated LV with EF 25% (prev. 20% in 09/2014), diffuse hypokinesis. No LVthrombus. Normal RV size with mildly decreased systolic function. No significant valvular abnormalities.  He saw cardiology in 12/2014 and at that visit, he refused an ICD placement. He was advised to wear his external defibrillator (vest), but stopped using it shortly after. He is on Carvedilol.  No SOB, CP, palpitations, syncope.  Thyroid uptake and scan on 01/10/2015: 24 hour radio iodine uptake calculated at 9%, slightly below the normal range.  Imaging of the thyroid gland demonstrates poor tracer localization within the thyroid lobes, with a few small areas of tracer localization seen with additional areas of decreased tracer localization.  Findings suggest presence of an enlarged multinodular thyroid gland though localization is insufficient for adequate characterization.  Potentially the thyroid gland could extend into the superior Mediastinum.  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 Since he did not have hyperthyroid symptoms,  we decided to just follow his thyroid labs.  Based on the results of the uptake and scan, his goiter could extend into the superior mediastinum (no compression  symptoms), I repeatedly suggested (including at today's visit) a thyroid ultrasound to better characterize his goiter. He refuses it.  We started low dose MMI as he refused RAI tx: 5 mg daily. No missed doses (has a pillbox).  I reviewed pt's thyroid tests - last TSH normal, on 5 mg MMI daily: Lab Results  Component Value Date   TSH 1.23 10/13/2016   TSH 0.81 04/09/2016   TSH 0.20 (L) 10/09/2015   TSH 0.27 (L) 04/08/2015   TSH 0.15 (L) 12/06/2014   TSH 0.187 (L) 10/08/2014   TSH 0.060 (L) 09/04/2014   FREET4 0.95 10/13/2016   FREET4 0.93 04/09/2016   FREET4 1.17 10/09/2015   FREET4 1.12 04/08/2015   FREET4 1.16 12/06/2014   FREET4 1.05 10/08/2014    Lab Results  Component Value Date   TSI 36 12/06/2014   Pt denies: - feeling nodules in neck - hoarseness - dysphagia - choking - SOB with lying down  Pt does have a FH of thyroid ds.: sister. No FH of thyroid cancer. No h/o radiation tx to head or neck.  No seaweed or kelp. No recent contrast studies. No herbal supplements. No Biotin use. No recent steroids use.   ROS: Constitutional: no weight gain/no weight loss, no fatigue, no subjective hyperthermia, no subjective hypothermia Eyes: no blurry vision, no xerophthalmia ENT: no sore throat, + see HPI Cardiovascular: no CP/no SOB/no palpitations/no leg swelling Respiratory: no cough/no SOB/no wheezing Gastrointestinal: no N/no V/no D/no C/no acid reflux Musculoskeletal: no muscle aches/no joint aches Skin: no rashes, no hair loss Neurological: no tremors/no numbness/no tingling/no dizziness  I  reviewed pt's medications, allergies, PMH, social hx, family hx, and changes were documented in the history of present illness. Otherwise, unchanged from my initial visit note.   Past Medical History:  Diagnosis Date  . CHF (congestive heart failure) ([place])   . Chronic systolic dysfunction of left ventricle   . Nonischemic cardiomyopathy ([place])   . NSVT (nonsustained ventricular  tachycardia) ([place])   . Urinary incontinence    Past Surgical History:  Procedure Laterality Date  . ANKLE FRACTURE SURGERY    . KNEE SURGERY    . LEFT HEART CATHETERIZATION WITH CORONARY ANGIOGRAM N/A 09/04/2014   Procedure: LEFT HEART CATHETERIZATION WITH CORONARY ANGIOGRAM;  Surgeon: Sanda Klein, MD;  Location: [facility] CATH LAB;  Service: Cardiovascular;  Laterality: N/A;   History   Social History  . Marital Status: Married    Spouse Name: N/A  . Number of Children: N/A   Occupational History  . N/a   Social History Main Topics  . Smoking status: Light Tobacco Smoker  . Smokeless tobacco: Not on file  . Alcohol Use: No  . Drug Use: No   Current Outpatient Prescriptions on File Prior to Visit  Medication Sig Dispense Refill  . aspirin EC 81 MG tablet Take 1 tablet (81 mg total) by mouth daily. 30 tablet 6  . atorvastatin (LIPITOR) 40 MG tablet take 1 tablet by mouth once daily AT 6 PM 90 tablet 3  . carvedilol (COREG) 12.5 MG tablet Take 1.5 tablets (18.75 mg total) by mouth 2 (two) times daily with a meal. 270 tablet 3  . doxycycline (VIBRAMYCIN) 100 MG capsule Take 1 capsule (100 mg total) by mouth 2 (two) times daily. 20 capsule 0  . isosorbide-hydrALAZINE (BIDIL) 20-37.5 MG tablet Take 1 tablet by mouth 3 (three) times daily. 90 tablet 6  . methimazole (TAPAZOLE) 5 MG tablet take 1 tablet by mouth once daily 90 tablet 1  . nitroGLYCERIN (NITROSTAT) 0.4 MG SL tablet Place 1 tablet (0.4 mg total) under the tongue every 5 (five) minutes x 3 doses as needed for chest pain. 25 tablet 12  . Rivaroxaban 15 & 20 MG TBPK Take as directed on package: Start with one 15mg  tablet by mouth twice a day with food. On Day 22, switch to one 20mg  tablet once a day with food. 51 each 0  . finasteride (PROSCAR) 5 MG tablet Take 5 mg by mouth daily.  0   No current facility-administered medications on file prior to visit.    No Known Allergies   FH: - see HPI  PE: BP 134/82 (BP Location:  Left Arm, Patient Position: Sitting)   Pulse 61   Wt 175 lb (79.4 kg)   SpO2 98%   BMI 27.41 kg/m  Body mass index is 27.41 kg/m. Wt Readings from Last 3 Encounters:  04/15/17 175 lb (79.4 kg)  04/09/17 170 lb (77.1 kg)  04/09/17 170 lb 3.2 oz (77.2 kg)   Constitutional: slightly overweight, in NAD Eyes: PERRLA, EOMI, no exophthalmos ENT: moist mucous membranes, + L thyroid fulness, no cervical lymphadenopathy Cardiovascular: RRR, No MRG Respiratory: CTA B Gastrointestinal: abdomen soft, NT, ND, BS+ Musculoskeletal: no deformities, strength intact in all 4 Skin: moist, warm, no rashes Neurological: no tremor with outstretched hands, DTR normal in all 4  ASSESSMENT: 1. Subclinical Thyrotoxicosis  2. Goiter  PLAN:  1. Patient with h/o TMNG, with subclinical thyrotoxicosis, now controlled on low dose MMI. He Refused RAI tx or surgery for the goiter. He also refused further  investigation for his mediastinal goiter as he is asymptomatic. No weight loss, no palpitations, no tremors, no anxiety. -  He does have significant CHF, with h/o EF of 25%, however, per review of the latest 2D ECHO from 01/2017: 55-60%. He refused an ICD in the past. At this visit, he again denies CP, SOB, palpitations.. - we again discussed about different options for treatment of his TMNG. He is doing well on low-dose methimazole and mentions that he is compliant with his doses as his wife organizes his pillbox. - Today we will check his TFTs and depending on the results, we may need to change the methimazole dose accordingly. -  We will check TSH, fT3 and fT4  today, and, depending on the results, we may need to change the MMI dose accordingly - He is on carvedilol per cardiology - I will see him back in one year, but I advised him to come back in 6 months to repeat his TFTs.  2. 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    Office Visit on 04/15/2017  Component Date Value Ref Range Status  . TSH 04/15/2017 1.72  0.35 - 4.50 uIU/mL Final  . Free T4 04/15/2017 0.96  0.60 - 1.60 ng/dL Final   Comment: Specimens from patients who are undergoing biotin therapy and /or ingesting biotin supplements may contain high levels of biotin.  The higher biotin concentration in these specimens interferes with this Free T4 assay.  Specimens that contain high levels  of biotin may cause false high results for this Free T4 assay.  Please interpret results in light of the total clinical presentation of the patient.    . T3, Free 04/15/2017 3.0  2.3 - 4.2 pg/mL Final    Labs are normal. Will continue current MMI dose.  Philemon Kingdom, MD PhD [facility] Endocrinology

## 2017-04-16 ENCOUNTER — Encounter: Payer: Self-pay | Admitting: Physician Assistant

## 2017-04-16 ENCOUNTER — Ambulatory Visit (INDEPENDENT_AMBULATORY_CARE_PROVIDER_SITE_OTHER): Payer: Medicare Other | Admitting: Physician Assistant

## 2017-04-16 VITALS — BP 100/60 | HR 84 | Temp 97.8°F | Resp 18 | Ht 67.0 in | Wt 171.0 lb

## 2017-04-16 DIAGNOSIS — J309 Allergic rhinitis, unspecified: Secondary | ICD-10-CM | POA: Insufficient documentation

## 2017-04-16 DIAGNOSIS — L309 Dermatitis, unspecified: Secondary | ICD-10-CM

## 2017-04-16 DIAGNOSIS — R600 Localized edema: Secondary | ICD-10-CM | POA: Diagnosis not present

## 2017-04-16 NOTE — Progress Notes (Signed)
Patient ID: Bryan Lamb, male    DOB: 24-Jun-1946, 71 y.o.   MRN: [id number]  PCP: System, Pcp Not In  Chief Complaint  Patient presents with  . Edema    both legs, pt reports legs are still swelling.  . Dermatitis  . Follow-up    Subjective:   Presents for evaluation of bilateral lower extremity edema, dermatitis and recent DVT. He is unaccompanied today.  He was seen 03/31/2017 with 1 week of swelling and rash on the legs, after mowing the yard, thinking he'd gotten in to poison ivy. He was treated with supportive care, and advised to return for a 1 week recheck.  At follow-up on 10/05, the symptoms were some improved, but he still had considerable edema on the LEFT. Chart review at that time revealed that he had stopped Entresto since his last cardiology visit, and decompensating heart failure was considered a likely cause/contributor, though he had no SOB, CP, fatigue, cough. Given the discrepancy in edema of the two legs, he was sent for LE dopplers, which revealed a DVT on the RIGHT. He was then seen in the ED, where he was started on Xarelto and doxycycline (thought a possible cause of the dermatitis and swelling) and advised to follow-up with his PCP. As he does not have a PCP, I reached out to arrange follow-up here.  [facility] has been trying to reach him to schedule follow-up there, but hasn't been successful. He relates that he was unaware of that and likes for his wife to manage all his appointments.  Tolerating the doxycycline. Less swelling, but still present, LEFT>RIGHT. Minimal pain. He remains without CP, SOB, orthopnea. No cough, increased fatigue, palpitations.  Declines vaccines, because he doesn't like shots. He is due for flu, pneumococcal and tetanus. "I keep a cold, year round." Sneezing, blowing nose. Usually only gets bad in the summertime. Declines treatment if it wouldn't be curative.  Has not yet had a colonoscopy or Hep C screening. He had  follow-up for toxic multinodular goiter with endocrinology yesterday.  Review of Systems As above.    Patient Active Problem List   Diagnosis Date Noted  . 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 Hypertension 02/26/2015  . Monoclonal gammopathy 01/14/2015  . Amyloidosis ([place])   . 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 Chronic anticoagulation 09/13/2014  . Encounter for therapeutic drug monitoring 09/10/2014  . Acute myocarditis 09/07/2014  . 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 Acute systolic CHF (congestive heart failure) ([place]) 09/05/2014  . Tobacco abuse 09/04/2014  . Dyslipidemia 09/04/2014  . Elevated troponin 09/03/2014  . Urinary incontinence      Prior to Admission medications   Medication Sig Start Date End Date Taking? Authorizing Provider  aspirin EC 81 MG tablet Take 1 tablet (81 mg total) by mouth daily. 02/04/17  Yes Larey Dresser, MD  atorvastatin (LIPITOR) 40 MG tablet take 1 tablet by mouth once daily AT 6 PM 12/17/16  Yes Allred, Jeneen Rinks, MD  carvedilol (COREG) 12.5 MG tablet Take 1.5 tablets (18.75 mg total) by mouth 2 (two) times daily with a meal. 02/01/17  Yes Allred, Jeneen Rinks, MD  doxycycline (VIBRAMYCIN) 100 MG capsule Take 1 capsule (100 mg total) by mouth 2 (two) times daily. 04/09/17  Yes Deno Etienne, DO  finasteride (PROSCAR) 5 MG tablet Take 5 mg by mouth daily. 07/05/14  Yes [provider]  isosorbide-hydrALAZINE (BIDIL) 20-37.5 MG tablet Take 1 tablet by mouth 3 (three) times daily. 02/04/17  Yes  Larey Dresser, MD  methimazole (TAPAZOLE) 5 MG tablet take 1 tablet by mouth once daily 01/12/17  Yes Philemon Kingdom, MD  Rivaroxaban 15 & 20 MG TBPK Take as directed on package: Start with one 15mg  tablet by mouth twice a day with food. On Day 22, switch to one 20mg  tablet once a day with food. 04/09/17  Yes Deno Etienne, DO  nitroGLYCERIN (NITROSTAT) 0.4 MG SL tablet Place 1 tablet (0.4 mg total) under the tongue every 5 (five) minutes  x 3 doses as needed for chest pain. Patient not taking: Reported on 04/16/2017 09/07/14   Barrett, Evelene Croon, PA-C     No Known Allergies     Objective:  Physical Exam  Constitutional: He is oriented to person, place, and time. He appears well-developed and well-nourished. He is active and cooperative. No distress.  BP 100/60 (BP Location: Left Arm, Patient Position: Sitting, Cuff Size: Normal)   Pulse 84   Temp 97.8 F (36.6 C) (Oral)   Resp 18   Ht 5\' 7"  (1.702 m)   Wt 171 lb (77.6 kg)   SpO2 98%   BMI 26.78 kg/m   HENT:  Head: Normocephalic and atraumatic.  Right Ear: Hearing normal.  Left Ear: Hearing normal.  Eyes: Conjunctivae are normal. No scleral icterus.  Neck: Normal range of motion. Neck supple. No thyromegaly present.  Cardiovascular: Normal rate, regular rhythm and normal heart sounds.   Pulses:      Radial pulses are 2+ on the right side, and 2+ on the left side.  1+ pitting edema of the LEFT lower leg. Trace edema of the RIGHT lower leg. No presacral edema.  Pulmonary/Chest: Effort normal and breath sounds normal.  Lymphadenopathy:       Head (right side): No tonsillar, no preauricular, no posterior auricular and no occipital adenopathy present.       Head (left side): No tonsillar, no preauricular, no posterior auricular and no occipital adenopathy present.    He has no cervical adenopathy.       Right: No supraclavicular adenopathy present.       Left: No supraclavicular adenopathy present.  Neurological: He is alert and oriented to person, place, and time. No sensory deficit.  Skin: Skin is warm, dry and intact. Rash (bilateral LE resolving cellulitis vs stasis dermatitis) noted. No cyanosis or erythema. Nails show no clubbing.  Psychiatric: He has a normal mood and affect. His speech is normal and behavior is normal.        Assessment & Plan:   1. Bilateral lower extremity edema 2. Dermatitis Resolving cellulitis vs stasis dermatitis. Suspect  decompensating heart failure, as he is no longer on Entresto. Had planned to start furosemide, but concerned that his pressure today would not tolerate it. Complete the doxycycline and continue with leg elevation and application of emollient. Provided the number to [facility] and urged him to call to schedule follow up.  3. Chronic allergic rhinitis He is not interested in non-curative treatment. Encouraged to let me know if he changes his mind.    Return in about 2 months (around 06/16/2017) for re-evaluation of chronic problems.   Fara Chute, PA-C [facility] [place]

## 2017-04-16 NOTE — Progress Notes (Signed)
Mr Wanat needs an appointment for CHF worsening.

## 2017-04-16 NOTE — Patient Instructions (Addendum)
Please call Dr. Claris Gladden office to schedule an appointment. They are trying to reach you. I'm concerned that the swelling is due to the heart failure and your medications may need to be adjusted. [phone]     IF you received an x-ray today, you will receive an invoice from [facility]. Please contact [facility] at [phone] with questions or concerns regarding your invoice.   IF you received labwork today, you will receive an invoice from [place]. Please contact [facility] at [phone] with questions or concerns regarding your invoice.   Our billing staff will not be able to assist you with questions regarding bills from these companies.  You will be contacted with the lab results as soon as they are available. The fastest way to get your results is to activate your My Chart account. Instructions are located on the last page of this paperwork. If you have not heard from [place] regarding the results in 2 weeks, please contact this office.

## 2017-04-19 NOTE — Telephone Encounter (Signed)
Pt's wife called on Friday 04/16/17 but d/t the [facility] her cell phone was almost out of battery so she was not able to wait for [place] to schedule appt.  I called this morning and spoke with patient's wife.  Pt is scheduled with APP 04/27/17 @11  am as pt needed a morning appt.

## 2017-04-21 ENCOUNTER — Ambulatory Visit (INDEPENDENT_AMBULATORY_CARE_PROVIDER_SITE_OTHER): Payer: Medicare Other | Admitting: Physician Assistant

## 2017-04-21 ENCOUNTER — Encounter: Payer: Self-pay | Admitting: Physician Assistant

## 2017-04-21 VITALS — BP 110/68 | HR 80 | Temp 97.7°F | Resp 18 | Ht 67.0 in | Wt 173.6 lb

## 2017-04-21 DIAGNOSIS — Z1211 Encounter for screening for malignant neoplasm of colon: Secondary | ICD-10-CM | POA: Diagnosis not present

## 2017-04-21 DIAGNOSIS — R399 Unspecified symptoms and signs involving the genitourinary system: Secondary | ICD-10-CM

## 2017-04-21 DIAGNOSIS — R300 Dysuria: Secondary | ICD-10-CM

## 2017-04-21 LAB — POCT URINALYSIS DIP (MANUAL ENTRY)
Bilirubin, UA: NEGATIVE
Glucose, UA: NEGATIVE mg/dL
Ketones, POC UA: NEGATIVE mg/dL
LEUKOCYTES UA: NEGATIVE
NITRITE UA: NEGATIVE
PH UA: 6 (ref 5.0–8.0)
PROTEIN UA: NEGATIVE mg/dL
RBC UA: NEGATIVE
Spec Grav, UA: <=1.005 — AB (ref 1.010–1.025)
UROBILINOGEN UA: 0.2 U/dL

## 2017-04-21 MED ORDER — FINASTERIDE 5 MG PO TABS: 5.0000 mg | ORAL_TABLET | Freq: Every day | ORAL | 3 refills | Status: DC

## 2017-04-21 NOTE — Progress Notes (Signed)
Patient ID: Bryan Lamb, male    DOB: 03/09/1946, 71 y.o.   MRN: [id number]  PCP: Harrison Mons, PA-C  Chief Complaint  Patient presents with  . Dysuria    x1 day, Pt states it stings to urinate.  . Medication Refill    Finasteride 5 MG, Pt's wife said he hs been out of the medcation for 3 weeks and thinks this is why he is having trouble using the bathroom.    Subjective:   Presents for evaluation of urinary frequency and dysuria. He is accompanied by his wife.  He relates progressively increasing urinary frequency and urgency over the past several weeks. He now also has some burning at the end of the urine stream. When he reported these symptoms to his wife, she realized that he had run out of finasteride at about the time the symptoms began. No fever, chills, nausea, vomiting. No hematuria. No pus. He is having some urge incontinence.  He was previously evaluated by urology, but he doesn't recall the diagnosis, and his wife was not present at that visit.  I have recently seen this patient x 3 visits, due to LE edema and dermatitis. He's completed a course of doxycycline for presumed cellulitis and started Rivaroxaban for treatment of a DVT in the RIGHT LE (dopplers performed due to concern for LEFT LE DVT). He is scheduled to follow-up with cardiology on 10/23 for evaluation of possible decompensation of heart failure, having stopped Entresto after his visit in August. He has not experienced any CP, SOB during the time he has had increased LE edema.    Review of Systems As above.    Patient Active Problem List   Diagnosis Date Noted  . Chronic allergic rhinitis 04/16/2017  . 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 Hypertension 02/26/2015  . Monoclonal gammopathy 01/14/2015  . Amyloidosis ([place])   . 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 Chronic anticoagulation 09/13/2014  . Encounter for therapeutic drug monitoring 09/10/2014  . Acute myocarditis 09/07/2014  .  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 Acute systolic CHF (congestive heart failure) ([place]) 09/05/2014  . Tobacco abuse 09/04/2014  . Dyslipidemia 09/04/2014  . Elevated troponin 09/03/2014  . Urinary incontinence      Prior to Admission medications   Medication Sig Start Date End Date Taking? Authorizing Provider  aspirin EC 81 MG tablet Take 1 tablet (81 mg total) by mouth daily. 02/04/17  Yes Larey Dresser, MD  atorvastatin (LIPITOR) 40 MG tablet take 1 tablet by mouth once daily AT 6 PM 12/17/16  Yes Allred, Jeneen Rinks, MD  carvedilol (COREG) 12.5 MG tablet Take 1.5 tablets (18.75 mg total) by mouth 2 (two) times daily with a meal. 02/01/17  Yes Allred, Jeneen Rinks, MD  doxycycline (VIBRAMYCIN) 100 MG capsule Take 1 capsule (100 mg total) by mouth 2 (two) times daily. 04/09/17  Yes Deno Etienne, DO  finasteride (PROSCAR) 5 MG tablet Take 5 mg by mouth daily. 07/05/14  Yes [provider]  isosorbide-hydrALAZINE (BIDIL) 20-37.5 MG tablet Take 1 tablet by mouth 3 (three) times daily. 02/04/17  Yes Larey Dresser, MD  methimazole (TAPAZOLE) 5 MG tablet take 1 tablet by mouth once daily 01/12/17  Yes Philemon Kingdom, MD  Rivaroxaban 15 & 20 MG TBPK Take as directed on package: Start with one 15mg  tablet by mouth twice a day with food. On Day 22, switch to one 20mg  tablet once a day with food. 04/09/17  Yes Deno Etienne, DO  nitroGLYCERIN (NITROSTAT)  0.4 MG SL tablet Place 1 tablet (0.4 mg total) under the tongue every 5 (five) minutes x 3 doses as needed for chest pain. Patient not taking: Reported on 04/21/2017 09/07/14   Barrett, Evelene Croon, PA-C     No Known Allergies     Objective:  Physical Exam  Constitutional: He is oriented to person, place, and time. He appears well-developed and well-nourished. He is active and cooperative. No distress.  BP 110/68 (BP Location: Left Arm, Patient Position: Sitting, Cuff Size: Normal)   Pulse 80   Temp 97.7 F (36.5 C) (Oral)   Resp 18    Ht 5\' 7"  (1.702 m)   Wt 173 lb 9.6 oz (78.7 kg)   SpO2 95%   BMI 27.19 kg/m   HENT:  Head: Normocephalic and atraumatic.  Right Ear: Hearing normal.  Left Ear: Hearing normal.  Eyes: Conjunctivae are normal. No scleral icterus.  Neck: Normal range of motion. Neck supple. No thyromegaly present.  Cardiovascular: Normal rate, regular rhythm, normal heart sounds and intact distal pulses.  Frequent extrasystoles are present.  Pulses:      Radial pulses are 2+ on the right side, and 2+ on the left side.  Pulmonary/Chest: Effort normal and breath sounds normal.  Abdominal: Soft. Normal appearance and bowel sounds are normal. He exhibits no distension and no mass. There is no hepatosplenomegaly. There is no tenderness. There is no CVA tenderness.  Palpation in the suprapubic increased urge to urinate. Crotch area is soiled with urine, as patient was unable to reach the bathroom in time.  Musculoskeletal:       Right lower leg: He exhibits edema (trace-1+). He exhibits no tenderness and no bony tenderness.       Left lower leg: He exhibits edema (1+). He exhibits no tenderness and no bony tenderness.  Lymphadenopathy:       Head (right side): No tonsillar, no preauricular, no posterior auricular and no occipital adenopathy present.       Head (left side): No tonsillar, no preauricular, no posterior auricular and no occipital adenopathy present.    He has no cervical adenopathy.       Right: No supraclavicular adenopathy present.       Left: No supraclavicular adenopathy present.  Neurological: He is alert and oriented to person, place, and time. No sensory deficit.  Skin: Skin is warm, dry and intact. No cyanosis or erythema. Nails show no clubbing.  Significant improvement in dermatitis on both lower legs  Psychiatric: He has a normal mood and affect. His speech is normal and behavior is normal.       Results for orders placed or performed in visit on 04/21/17  POCT urinalysis dipstick    Result Value Ref Range   Color, UA yellow yellow   Clarity, UA clear clear   Glucose, UA negative negative mg/dL   Bilirubin, UA negative negative   Ketones, POC UA negative negative mg/dL   Spec Grav, UA <=1.005 (A) 1.010 - 1.025   Blood, UA negative negative   pH, UA 6.0 5.0 - 8.0   Protein Ur, POC negative negative mg/dL   Urobilinogen, UA 0.2 0.2 or 1.0 E.U./dL   Nitrite, UA Negative Negative   Leukocytes, UA Negative Negative       Assessment & Plan:   1. Dysuria Suspect this is related to BPH. Resume finasteride, await UCx and follow-up with urology. - POCT urinalysis dipstick - Urine Culture  2. Lower urinary tract symptoms Likely BPH. Resume  finasteride. Follow-up with urology. - finasteride (PROSCAR) 5 MG tablet; Take 1 tablet (5 mg total) by mouth daily.  Dispense: 90 tablet; Refill: 3 - Ambulatory referral to Urology  3. Screening for colon cancer - Ambulatory referral to Gastroenterology    Return for re-evaluation, as previously planned, sooner if needed.   Fara Chute, PA-C [facility] [place]

## 2017-04-21 NOTE — Assessment & Plan Note (Signed)
Await UCx. Resume finasteride. Re-evaluate with urology.

## 2017-04-21 NOTE — Patient Instructions (Signed)
     IF you received an x-ray today, you will receive an invoice from [facility]. Please contact [facility] at [phone] with questions or concerns regarding your invoice.   IF you received labwork today, you will receive an invoice from [facility]. Please contact [facility] at [phone] with questions or concerns regarding your invoice.   Our billing staff will not be able to assist you with questions regarding bills from these companies.  You will be contacted with the lab results as soon as they are available. The fastest way to get your results is to activate your My Chart account. Instructions are located on the last page of this paperwork. If you have not heard from us regarding the results in 2 weeks, please contact this office.     

## 2017-04-22 LAB — URINE CULTURE: ORGANISM ID, BACTERIA: NO GROWTH

## 2017-04-23 ENCOUNTER — Telehealth: Payer: Self-pay

## 2017-04-23 NOTE — Telephone Encounter (Signed)
LVM, gave lab results. Gave call back number if any questions or concerns.  

## 2017-04-27 ENCOUNTER — Encounter (HOSPITAL_COMMUNITY): Payer: Self-pay

## 2017-04-27 ENCOUNTER — Ambulatory Visit (HOSPITAL_COMMUNITY)
Admission: RE | Admit: 2017-04-27 | Discharge: 2017-04-27 | Disposition: A | Discharge status: Home / Self Care | Payer: Medicare Other | Source: Ambulatory Visit | Attending: Cardiology | Admitting: Cardiology

## 2017-04-27 VITALS — BP 110/66 | HR 74 | Wt 167.2 lb

## 2017-04-27 DIAGNOSIS — E0520 Thyrotoxicosis with toxic multinodular goiter without thyrotoxic crisis or storm: Secondary | ICD-10-CM | POA: Diagnosis not present

## 2017-04-27 DIAGNOSIS — I82402 Acute embolism and thrombosis of unspecified deep veins of left lower extremity: Secondary | ICD-10-CM | POA: Diagnosis not present

## 2017-04-27 DIAGNOSIS — Z7901 Long term (current) use of anticoagulants: Secondary | ICD-10-CM | POA: Diagnosis not present

## 2017-04-27 DIAGNOSIS — R32 Unspecified urinary incontinence: Secondary | ICD-10-CM | POA: Insufficient documentation

## 2017-04-27 DIAGNOSIS — R0602 Shortness of breath: Secondary | ICD-10-CM | POA: Diagnosis not present

## 2017-04-27 DIAGNOSIS — Z86718 Personal history of other venous thrombosis and embolism: Secondary | ICD-10-CM | POA: Insufficient documentation

## 2017-04-27 DIAGNOSIS — I513 Intracardiac thrombosis, not elsewhere classified: Secondary | ICD-10-CM | POA: Diagnosis not present

## 2017-04-27 DIAGNOSIS — F17210 Nicotine dependence, cigarettes, uncomplicated: Secondary | ICD-10-CM | POA: Diagnosis not present

## 2017-04-27 DIAGNOSIS — Z7982 Long term (current) use of aspirin: Secondary | ICD-10-CM | POA: Diagnosis not present

## 2017-04-27 DIAGNOSIS — I493 Ventricular premature depolarization: Secondary | ICD-10-CM | POA: Diagnosis not present

## 2017-04-27 DIAGNOSIS — Z79899 Other long term (current) drug therapy: Secondary | ICD-10-CM | POA: Insufficient documentation

## 2017-04-27 DIAGNOSIS — I428 Other cardiomyopathies: Secondary | ICD-10-CM | POA: Diagnosis not present

## 2017-04-27 DIAGNOSIS — E785 Hyperlipidemia, unspecified: Secondary | ICD-10-CM | POA: Insufficient documentation

## 2017-04-27 DIAGNOSIS — I5022 Chronic systolic (congestive) heart failure: Secondary | ICD-10-CM | POA: Diagnosis not present

## 2017-04-27 LAB — BASIC METABOLIC PANEL
Anion gap: 8 (ref 5–15)
BUN: 9 mg/dL (ref 6–20)
CALCIUM: 8.7 mg/dL — AB (ref 8.9–10.3)
CHLORIDE: 106 mmol/L (ref 101–111)
CO2: 23 mmol/L (ref 22–32)
CREATININE: 0.99 mg/dL (ref 0.61–1.24)
GFR calc non Af Amer: >60 mL/min (ref >60)
Glucose, Bld: 109 mg/dL — ABNORMAL HIGH (ref 65–99)
Potassium: 4.1 mmol/L (ref 3.5–5.1)
SODIUM: 137 mmol/L (ref 135–145)

## 2017-04-27 LAB — CBC
HCT: 31.9 % — ABNORMAL LOW (ref 39.0–52.0)
Hemoglobin: 10 g/dL — ABNORMAL LOW (ref 13.0–17.0)
MCH: 28 pg (ref 26.0–34.0)
MCHC: 31.3 g/dL (ref 30.0–36.0)
MCV: 89.4 fL (ref 78.0–100.0)
PLATELETS: 194 10*3/uL (ref 150–400)
RBC: 3.57 MIL/uL — AB (ref 4.22–5.81)
RDW: 15.1 % (ref 11.5–15.5)
WBC: 4.4 10*3/uL (ref 4.0–10.5)

## 2017-04-27 MED ORDER — FUROSEMIDE 20 MG PO TABS: 20.0000 mg | ORAL_TABLET | ORAL | 6 refills | Status: DC

## 2017-04-27 NOTE — Progress Notes (Signed)
Patient ID: Bryan Lamb, male   DOB: 01/24/1946, 71 y.o.   MRN: [id number] PCP: None  Primary Cardiologist: Dr Aundra Dubin INR: [facility].   HPI: Bryan Lamb is a 71 yo smoker with history of myocarditis, systolic heart failure, PVCs, apical blood stasis on coumadin, and former smoker (quit in February 2016).   Admitted to [facility] in 2/16 with dyspnea and found to have probable acute myocarditis. Had LHC and CMRI as noted below. Due to frequent PVCs and NSVT, he was discharged with a Lifevest. Started CHF meds. He refused ICD eventually and Lifevest was taken off.   I have not seen him for 2 years.  In the mean time, he has actually continued his cardiac meds.  He had a workup for amyloidosis => fat pad biopsy was negative for amyloidosis in 2016.  Given positive SPEP, he saw heme/onc and had bone marrow biopsy that was negative for amyloidosis and did not suggest multiple myeloma.  He had repeat myeloma panel done that was negative.  Conclusion was no myeloma, no AL amyloidosis, no MGUS.   He is being treated by endocrinology with methimazole for toxic multinodular goiter.    Returns today for HF follow up. He was recently seen in the ED at the beginning of October and found to have  A DVT in his left leg. He was started on Xarelto and is taking that daily. He feels well, does endorse more lower extremity edema in both legs. Feels slightly SOB with walking in stores, SOB with stairs. Drinking more than 2L a day, does eat some high salt foods.   Repeat echo was done in 7/18, this showed EF up to normal range.    LHC (09/04/2014): No angiographic CAD. Cardiac MRI (09/05/2014 ): EF 15-20% diffuse hypokinesis, moderate RV systolic dysfunction, concern for early thrombus LV apex, mid inferior and peri-apical transmural LGE with mid-wall RV LGE => concern for acute myocarditis.  ECHO 08/08/2014 EF 20% LV moderately dilated.  ECG (3/16): NSR, LVH with marked anterolateral T wave inversions Echo (6/16): Moderately dilated LV  with EF 25%, normal RV size with mildly decreased systolic function.  Echo (7/18): EF 55-60%, moderate LVH, trivial Bryan  Labs (3/16): K 3.9, creatinine 1.3, BNP 162, HIV negative, ferritin 133, LDL 112, TSH 0.06, SPEP with monoclonal protein Labs (4/16): LFTs normal, free T3 normal, free T4 normal, TSH 0.187 (low), K 3.9, creatinine 1.03, LDL 60 Labs (5/16): K 4.5, creatinine 1.07, BNP 230 Labs (7/18): K 4.2, creatinine 0.96, NT-proBNP 500  ECG (personally reviewed): NSR, deep inferior and anterolateral T wave inversions  ROS: All systems negative except as listed in HPI, PMH and Problem List.  SH:  Social History   Social History  . Marital status: Married    Spouse name: Visua  . Number of children: 2  . Years of education: 10th grade   Occupational History  . 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 Smoking status: Heavy Tobacco Smoker    Packs/day: 1.00    Years: 52.00  . Smokeless tobacco: Never Used  . Alcohol use No     Comment: remote heavy ETOH, none for years  . Drug use: No  . Sexual activity: Not on file   Other Topics Concern  . Not on file   Social History Narrative   Pt lives in [place] with spouse.  Previously worked at [employer].  Retired.  2 grown daughters are  healthy.    FH: No cardiomyopathy or premature CAD.   Past Medical History:  Diagnosis Date  . CHF (congestive heart failure) ([place])   . Chronic systolic dysfunction of left ventricle   . Nonischemic cardiomyopathy ([place])   . NSVT (nonsustained ventricular tachycardia) ([place])   . Urinary incontinence     Current Outpatient Prescriptions  Medication Sig Dispense Refill  . aspirin EC 81 MG tablet Take 1 tablet (81 mg total) by mouth daily. 30 tablet 6  . atorvastatin (LIPITOR) 40 MG tablet take 1 tablet by mouth once daily AT 6 PM 90 tablet 3  . carvedilol (COREG) 12.5 MG tablet Take 1.5 tablets (18.75 mg total) by mouth 2  (two) times daily with a meal. 270 tablet 3  . finasteride (PROSCAR) 5 MG tablet Take 1 tablet (5 mg total) by mouth daily. 90 tablet 3  . isosorbide-hydrALAZINE (BIDIL) 20-37.5 MG tablet Take 1 tablet by mouth 3 (three) times daily. 90 tablet 6  . nitroGLYCERIN (NITROSTAT) 0.4 MG SL tablet Place 1 tablet (0.4 mg total) under the tongue every 5 (five) minutes x 3 doses as needed for chest pain. 25 tablet 12  . Rivaroxaban 15 & 20 MG TBPK Take as directed on package: Start with one '15mg'$  tablet by mouth twice a day with food. On Day 22, switch to one '20mg'$  tablet once a day with food. 51 each 0  . methimazole (TAPAZOLE) 5 MG tablet take 1 tablet by mouth once daily (Patient not taking: Reported on 04/27/2017) 90 tablet 1   No current facility-administered medications for this encounter.     Vitals:   04/27/17 1109  BP: 110/66  Pulse: 74  SpO2: 98%  Weight: 167 lb 4 oz (75.9 kg)    PHYSICAL EXAM: General: Well appearing. No resp difficulty. HEENT: Normal Neck: Supple. JVP 6-7 cm . Carotids 2+ bilat; no bruits. No thyromegaly or nodule noted. Cor: PMI nondisplaced. RRR, No M/G/R noted Lungs: CTAB, normal effort. Abdomen: Soft, non-tender, non-distended, no HSM. No bruits or masses. +BS  Extremities: No cyanosis, clubbing, or rash. LLE with 2+ pedal and pretibial edema. RLE with 2+ pedal edema.  Neuro: Alert & orientedx3, cranial nerves grossly intact. moves all 4 extremities w/o difficulty. Affect pleasant   ASSESSMENT & PLAN: 1. Chronic Systolic heart Failure: Nonischemic cardiomyopathy. Left heart cath 3/16 showed no angiographic CAD. Cardiac MRI (see description above) was concerning for acute myocarditis with T2 enhancement and multiple areas of mid-wall and transmural LGE. There was also concern for blood stasis/early thrombus in apex so warfarin started. EF 15-20% at that time. ECG and echo appearance not classic for amyloidosis, and there was late gadolinium enhancement on MRI  but more in a myocarditis-type pattern.  However, SPEP showed a monoclonal protein => he had extensive evaluation with abdominal fat pad biopsy and bone marrow biopsy, neither showed amyloidosis.  Hematology evaluation was NOT suggestive of multiple myeloma, MGUS, or AL amyloidosis. Most recent Echo shows EF 55-60% in July 2018.  - NYHA II - Does appear to have some volume today, not much but will start lasix 20 mg every other day to help with lower extremity edema.  - Continue Coreg 18.75 mg BID - Continue Bidil 1 tab TID - Continue Entresto 24/26 mg BID  2. Ventricular ectopy: - Stable. EF out of ICD range.   3. 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  With recovery of LV function and no apical aneurysm. Warfarin stopped. But now  on Xarelto for DVT.   4. Hyperlipidemia:  - Continue statin  5. Toxic multinodular goiter: Followed by endocrinology.   6. DVT - Continue Xarelto.   Follow up in 2 months with Dr. Aundra Dubin. BMET, CBC today.   Arbutus Leas 04/27/2017

## 2017-04-27 NOTE — Patient Instructions (Signed)
START Lasix 20 mg tablet once every OTHER day.  Routine lab work today. Will notify you of abnormal results, otherwise no news is good news!  Follow up 2 months with Dr. Aundra Dubin.  __________________________________________________________  Bryan Lamb Code: 8002  Take all medication as prescribed the day of your appointment. Bring all medications with you to your appointment.  Do the following things EVERYDAY: 1) Weigh yourself in the morning before breakfast. Write it down and keep it in a log. 2) Take your medicines as prescribed 3) Eat low salt foods-Limit salt (sodium) to 2000 mg per day.  4) Stay as active as you can everyday 5) Limit all fluids for the day to less than 2 liters

## 2017-05-06 ENCOUNTER — Telehealth: Payer: Self-pay

## 2017-05-06 NOTE — Telephone Encounter (Signed)
-----   Message from Harrison Mons, [place] sent at 04/27/2017  6:53 PM EDT ----- Please call this patient (or his wife). The urine culture is NORMAL. His urination should be better, back on the finasteride (Proscar).

## 2017-05-06 NOTE — Telephone Encounter (Signed)
IC LMOVM - Chelle's message - culture normal- urination should be improved, continue Proscar.

## 2017-05-13 ENCOUNTER — Telehealth (HOSPITAL_COMMUNITY): Payer: Self-pay | Admitting: Pharmacist

## 2017-05-13 MED ORDER — HYDRALAZINE HCL 25 MG PO TABS: 37.5000 mg | ORAL_TABLET | Freq: Three times a day (TID) | ORAL | 3 refills | Status: DC

## 2017-05-13 MED ORDER — ISOSORBIDE MONONITRATE ER 60 MG PO TB24: 60.0000 mg | ORAL_TABLET | Freq: Every day | ORAL | 3 refills | Status: DC

## 2017-05-13 NOTE — Telephone Encounter (Signed)
Received fax from [facility] stating that Bidil is on backorder. Per discussion with Dr. Aundra Dubin, will switch to hydralazine 37.5 mg TID and isosorbide mononitrate 60 mg daily.   Ruta Hinds. Velva Harman, PharmD, BCPS, CPP Clinical Pharmacist Pager: [phone] Phone: [phone] 05/13/2017 3:31 PM

## 2017-05-24 ENCOUNTER — Encounter: Payer: Self-pay | Admitting: Physician Assistant

## 2017-06-11 ENCOUNTER — Encounter (HOSPITAL_COMMUNITY): Payer: Self-pay | Admitting: Cardiology

## 2017-06-11 ENCOUNTER — Ambulatory Visit (HOSPITAL_COMMUNITY)
Admission: RE | Admit: 2017-06-11 | Discharge: 2017-06-11 | Disposition: A | Discharge status: Home / Self Care | Payer: Medicare Other | Source: Ambulatory Visit | Attending: Cardiology | Admitting: Cardiology

## 2017-06-11 VITALS — BP 131/62 | HR 57 | Wt 170.8 lb

## 2017-06-11 DIAGNOSIS — E785 Hyperlipidemia, unspecified: Secondary | ICD-10-CM | POA: Insufficient documentation

## 2017-06-11 DIAGNOSIS — Z79899 Other long term (current) drug therapy: Secondary | ICD-10-CM | POA: Diagnosis not present

## 2017-06-11 DIAGNOSIS — I493 Ventricular premature depolarization: Secondary | ICD-10-CM | POA: Insufficient documentation

## 2017-06-11 DIAGNOSIS — F17210 Nicotine dependence, cigarettes, uncomplicated: Secondary | ICD-10-CM | POA: Diagnosis not present

## 2017-06-11 DIAGNOSIS — I428 Other cardiomyopathies: Secondary | ICD-10-CM | POA: Insufficient documentation

## 2017-06-11 DIAGNOSIS — I5022 Chronic systolic (congestive) heart failure: Secondary | ICD-10-CM | POA: Diagnosis not present

## 2017-06-11 DIAGNOSIS — E0520 Thyrotoxicosis with toxic multinodular goiter without thyrotoxic crisis or storm: Secondary | ICD-10-CM | POA: Diagnosis not present

## 2017-06-11 DIAGNOSIS — E859 Amyloidosis, unspecified: Secondary | ICD-10-CM | POA: Diagnosis not present

## 2017-06-11 DIAGNOSIS — Z86718 Personal history of other venous thrombosis and embolism: Secondary | ICD-10-CM | POA: Insufficient documentation

## 2017-06-11 DIAGNOSIS — I5020 Unspecified systolic (congestive) heart failure: Secondary | ICD-10-CM | POA: Diagnosis present

## 2017-06-11 DIAGNOSIS — Z7901 Long term (current) use of anticoagulants: Secondary | ICD-10-CM | POA: Insufficient documentation

## 2017-06-11 LAB — BASIC METABOLIC PANEL
ANION GAP: 7 (ref 5–15)
BUN: 10 mg/dL (ref 6–20)
CHLORIDE: 108 mmol/L (ref 101–111)
CO2: 22 mmol/L (ref 22–32)
Calcium: 8.9 mg/dL (ref 8.9–10.3)
Creatinine, Ser: 1.12 mg/dL (ref 0.61–1.24)
GFR calc Af Amer: >60 mL/min (ref >60)
GFR calc non Af Amer: >60 mL/min (ref >60)
GLUCOSE: 89 mg/dL (ref 65–99)
POTASSIUM: 4.3 mmol/L (ref 3.5–5.1)
Sodium: 137 mmol/L (ref 135–145)

## 2017-06-11 LAB — BRAIN NATRIURETIC PEPTIDE: B Natriuretic Peptide: 33.5 pg/mL (ref 0.0–100.0)

## 2017-06-11 MED ORDER — RIVAROXABAN 20 MG PO TABS: 20.0000 mg | ORAL_TABLET | Freq: Every day | ORAL | 3 refills | Status: DC

## 2017-06-11 NOTE — Patient Instructions (Signed)
Stop Aspirin   Start Xarelto 20 mg (1 tab) daily (30 day free given)   Labs drawn today (if we do not call you, then your lab work was stable)   You have been referred to have Amyloid testing scan  Your physician recommends that you schedule a follow-up appointment in: 4 months with Dr. Aundra Dubin  (we will call you)

## 2017-06-13 NOTE — Progress Notes (Signed)
Patient ID: Bryan Lamb, male   DOB: [birth year], 71 y.o.   MRN: [id number] PCP: None  Primary Cardiologist: Dr Aundra Dubin INR: [facility].   HPI: Bryan Lamb is a 71 yo smoker with history of myocarditis, systolic heart failure, PVCs, apical blood stasis on coumadin, and former smoker (quit in February 2016).   Admitted to [facility] in 2/16 with dyspnea and found to have probable acute myocarditis. Had LHC and CMRI as noted below. Due to frequent PVCs and NSVT, he was discharged with a Lifevest. Started CHF meds. He refused ICD eventually and Lifevest was taken off.   I have not seen him for 2 years.  In the mean time, he has actually continued his cardiac meds.  He had a workup for amyloidosis => fat pad biopsy was negative for amyloidosis in 2016.  Given positive SPEP, he saw heme/onc and had bone marrow biopsy that was negative for amyloidosis and did not suggest multiple myeloma.  He had repeat myeloma panel done that was negative.  Conclusion was no myeloma, no AL amyloidosis, no MGUS.   He is being treated by endocrinology with methimazole for toxic multinodular goiter.    Repeat echo was done in 7/18, this showed EF up to normal range.    In 10/18, he was found to have a spontaneous DVT.  He was started on Xarelto but appears to have stopped it after about a month, unsure why it was stopped.   He returns today for followup of CHF. Generally feeling good.  He is not on Entresto but not sure why not.  No ankle swelling.  No significant exertional dyspnea or chest pain.  No orthopnea/PND. No BRBPR/melena.    LHC (09/04/2014): No angiographic CAD. Cardiac MRI (09/05/2014 ): EF 15-20% diffuse hypokinesis, moderate RV systolic dysfunction, concern for early thrombus LV apex, mid inferior and peri-apical transmural LGE with mid-wall RV LGE => concern for acute myocarditis.  ECHO 08/08/2014 EF 20% LV moderately dilated.  ECG (3/16): NSR, LVH with marked anterolateral T wave inversions Echo (6/16): Moderately dilated LV  with EF 25%, normal RV size with mildly decreased systolic function.  Echo (7/18): EF 55-60%, moderate LVH, trivial Bryan  Labs (3/16): K 3.9, creatinine 1.3, BNP 162, HIV negative, ferritin 133, LDL 112, TSH 0.06, SPEP with monoclonal protein Labs (4/16): LFTs normal, free T3 normal, free T4 normal, TSH 0.187 (low), K 3.9, creatinine 1.03, LDL 60 Labs (5/16): K 4.5, creatinine 1.07, BNP 230 Labs (7/18): K 4.2, creatinine 0.96, NT-proBNP 500 Labs (10/18): hgb 10, K 4.1, creatinine 0.99  ROS: All systems negative except as listed in HPI, PMH and Problem List.  SH:  Social History   Socioeconomic History  . Marital status: Married    Spouse name: Bryan Lamb  . Number of children: 2  . Years of education: 10th grade  . Highest education level: Not on file  Social Needs  . Financial resource strain: Not on file  . Food insecurity - worry: Not on file  . Food insecurity - inability: Not on file  . Transportation needs - medical: Not on file  . Transportation needs - non-medical: Not on file  Occupational History  . Occupation: retired     Comment: Chief Technology Officer, mill work, [employer]  Tobacco Use  . Smoking status: Heavy Tobacco Smoker    Packs/day: 1.00    Years: 52.00    Pack years: 52.00  . Smokeless tobacco: Never Used  Substance and Sexual Activity  . Alcohol use: No  Alcohol/week: 0.0 oz    Comment: remote heavy ETOH, none for years  . Drug use: No  . Sexual activity: Not on file  Other Topics Concern  . Not on file  Social History Narrative   Pt lives in [place] with spouse.  Previously worked at [employer].  Retired.  2 grown daughters are healthy.    FH: No cardiomyopathy or premature CAD.   Past Medical History:  Diagnosis Date  . CHF (congestive heart failure) ([place])   . Chronic systolic dysfunction of left ventricle   . Nonischemic cardiomyopathy ([place])   . NSVT (nonsustained ventricular tachycardia) ([place])   . Urinary incontinence      Current Outpatient Medications  Medication Sig Dispense Refill  . atorvastatin (LIPITOR) 40 MG tablet take 1 tablet by mouth once daily AT 6 PM 90 tablet 3  . carvedilol (COREG) 12.5 MG tablet Take 1.5 tablets (18.75 mg total) by mouth 2 (two) times daily with a meal. 270 tablet 3  . finasteride (PROSCAR) 5 MG tablet Take 1 tablet (5 mg total) by mouth daily. 90 tablet 3  . furosemide (LASIX) 20 MG tablet Take 1 tablet (20 mg total) by mouth every other day. 15 tablet 6  . hydrALAZINE (APRESOLINE) 25 MG tablet Take 1.5 tablets (37.5 mg total) 3 (three) times daily by mouth. 135 tablet 3  . isosorbide mononitrate (IMDUR) 60 MG 24 hr tablet Take 1 tablet (60 mg total) daily by mouth. 30 tablet 3  . methimazole (TAPAZOLE) 5 MG tablet take 1 tablet by mouth once daily 90 tablet 1  . nitroGLYCERIN (NITROSTAT) 0.4 MG SL tablet Place 1 tablet (0.4 mg total) under the tongue every 5 (five) minutes x 3 doses as needed for chest pain. 25 tablet 12  . Rivaroxaban 15 & 20 MG TBPK Take as directed on package: Start with one '15mg'$  tablet by mouth twice a day with food. On Day 22, switch to one '20mg'$  tablet once a day with food. 51 each 0  . rivaroxaban (XARELTO) 20 MG TABS tablet Take 1 tablet (20 mg total) by mouth daily with supper. 30 tablet 3   No current facility-administered medications for this encounter.     Vitals:   06/11/17 0948  BP: 131/62  Pulse: (!) 57  SpO2: 98%  Weight: 170 lb 12.8 oz (77.5 kg)    PHYSICAL EXAM: General: NAD Neck: No JVD, no thyromegaly or thyroid nodule.  Lungs: Clear to auscultation bilaterally with normal respiratory effort. CV: Nondisplaced PMI.  Heart regular S1/S2, no S3/S4, no murmur.  No peripheral edema.  No carotid bruit.  Normal pedal pulses.  Abdomen: Soft, nontender, no hepatosplenomegaly, no distention.  Skin: Intact without lesions or rashes.  Neurologic: Alert and oriented x 3.  Psych: Normal affect. Extremities: No clubbing or cyanosis.   HEENT: Normal.   ASSESSMENT & PLAN: 1. Chronic Systolic heart Failure: Nonischemic cardiomyopathy. Left heart cath 3/16 showed no angiographic CAD. Cardiac MRI (see description above) was concerning for acute myocarditis with T2 enhancement and multiple areas of mid-wall and transmural LGE. There was also concern for blood stasis/early thrombus in apex so warfarin started. EF 15-20% at that time. ECG and echo appearance not classic for amyloidosis, and the late gadolinium enhancement on MRI seemed to be more in a myocarditis-type pattern.  However, SPEP showed a monoclonal protein => he had extensive evaluation with abdominal fat pad biopsy and bone marrow biopsy, neither showed amyloidosis.  Hematology evaluation was NOT suggestive of multiple  myeloma, MGUS, or AL amyloidosis.  Recent echo showed improvement in LV function to normal range with EF 55-60%. He does not look volume overloaded on exam today.  - Continue Lasix 20 mg every other day.  BMET/BNP today.  - Continue Coreg 18.75 mg bid, hydralazine/Imdur.  EF is now normal.  He is off Entresto, not sure why.  With normal EF, will not restart it.  - I will arrange for a PYP amyloidosis scan to assess for TTR amyloidosis.    2. Ventricular ectopy: EF now improved beyond ICD range.  3. 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  With recovery of LV function and no apical aneurysm, warfarin had been stopped.  However, with spontaneous DVT, I think that he needs to be on anticoagulation (see below).  4. Hyperlipidemia: Continue statin.  5. Toxic multinodular goiter: Followed by endocrinology.  6. DVT: Diagnosed in 10/18.  Spontaneous (no triggering event). He took Xarelto for about a month then stopped it, not sure why.  I think that he needs to be on Xarelto long-term, especially with his prior history of LV thrombus.  - Restart Xarelto 20 mg daily.  - Stop ASA.   Followup in 4 months.    Loralie Champagne 06/13/2017

## 2017-06-18 ENCOUNTER — Ambulatory Visit: Payer: Medicare Other | Admitting: Physician Assistant

## 2017-06-21 ENCOUNTER — Ambulatory Visit (HOSPITAL_COMMUNITY): Payer: Medicare Other

## 2017-06-21 ENCOUNTER — Ambulatory Visit (HOSPITAL_COMMUNITY)
Admission: RE | Admit: 2017-06-21 | Discharge: 2017-06-21 | Disposition: A | Discharge status: Home / Self Care | Payer: Medicare Other | Source: Ambulatory Visit | Attending: Cardiology | Admitting: Cardiology

## 2017-06-21 DIAGNOSIS — I509 Heart failure, unspecified: Secondary | ICD-10-CM | POA: Diagnosis not present

## 2017-06-21 DIAGNOSIS — E859 Amyloidosis, unspecified: Secondary | ICD-10-CM | POA: Diagnosis not present

## 2017-06-21 MED ORDER — TECHNETIUM TC 99M PYROPHOSPHATE
20.0000 | Freq: Once | INTRAVENOUS | Status: AC
  Administered 2017-06-21: 20 via INTRAVENOUS

## 2017-10-11 ENCOUNTER — Encounter: Payer: Self-pay | Admitting: Physician Assistant

## 2017-10-13 ENCOUNTER — Other Ambulatory Visit (HOSPITAL_COMMUNITY): Payer: Self-pay | Admitting: *Deleted

## 2017-10-13 MED ORDER — RIVAROXABAN 20 MG PO TABS: 20.0000 mg | ORAL_TABLET | Freq: Every day | ORAL | 3 refills | Status: DC

## 2017-10-14 ENCOUNTER — Other Ambulatory Visit: Payer: Medicare HMO

## 2017-10-16 ENCOUNTER — Other Ambulatory Visit: Payer: Self-pay | Admitting: Internal Medicine

## 2017-11-08 ENCOUNTER — Other Ambulatory Visit: Payer: Self-pay | Admitting: Internal Medicine

## 2018-01-15 ENCOUNTER — Other Ambulatory Visit: Payer: Self-pay | Admitting: Internal Medicine

## 2018-02-10 ENCOUNTER — Other Ambulatory Visit: Payer: Self-pay | Admitting: Internal Medicine

## 2018-02-11 ENCOUNTER — Other Ambulatory Visit (HOSPITAL_COMMUNITY): Payer: Self-pay

## 2018-02-11 MED ORDER — RIVAROXABAN 20 MG PO TABS: 20.0000 mg | ORAL_TABLET | Freq: Every day | ORAL | 3 refills | Status: DC

## 2018-02-12 ENCOUNTER — Other Ambulatory Visit: Payer: Self-pay | Admitting: Internal Medicine

## 2018-02-28 ENCOUNTER — Ambulatory Visit: Payer: Medicare HMO | Admitting: Internal Medicine

## 2018-03-11 ENCOUNTER — Other Ambulatory Visit: Payer: Self-pay | Admitting: Internal Medicine

## 2018-03-27 ENCOUNTER — Other Ambulatory Visit: Payer: Self-pay | Admitting: Internal Medicine

## 2018-04-04 ENCOUNTER — Other Ambulatory Visit: Payer: Self-pay | Admitting: Internal Medicine

## 2018-04-15 ENCOUNTER — Encounter: Payer: Self-pay | Admitting: Internal Medicine

## 2018-04-15 ENCOUNTER — Ambulatory Visit (HOSPITAL_COMMUNITY)
Admission: RE | Admit: 2018-04-15 | Discharge: 2018-04-15 | Disposition: A | Discharge status: Home / Self Care | Payer: Medicare HMO | Source: Ambulatory Visit | Attending: Cardiology | Admitting: Cardiology

## 2018-04-15 ENCOUNTER — Ambulatory Visit (INDEPENDENT_AMBULATORY_CARE_PROVIDER_SITE_OTHER): Payer: Medicare HMO | Admitting: Internal Medicine

## 2018-04-15 VITALS — BP 118/70 | HR 70 | Ht 67.0 in | Wt 170.0 lb

## 2018-04-15 VITALS — BP 151/70 | HR 56 | Wt 171.8 lb

## 2018-04-15 DIAGNOSIS — Z79899 Other long term (current) drug therapy: Secondary | ICD-10-CM | POA: Diagnosis not present

## 2018-04-15 DIAGNOSIS — E0520 Thyrotoxicosis with toxic multinodular goiter without thyrotoxic crisis or storm: Secondary | ICD-10-CM

## 2018-04-15 DIAGNOSIS — Z7901 Long term (current) use of anticoagulants: Secondary | ICD-10-CM | POA: Diagnosis not present

## 2018-04-15 DIAGNOSIS — E0590 Thyrotoxicosis, unspecified without thyrotoxic crisis or storm: Secondary | ICD-10-CM

## 2018-04-15 DIAGNOSIS — I5021 Acute systolic (congestive) heart failure: Secondary | ICD-10-CM | POA: Diagnosis not present

## 2018-04-15 DIAGNOSIS — I5022 Chronic systolic (congestive) heart failure: Secondary | ICD-10-CM | POA: Diagnosis not present

## 2018-04-15 DIAGNOSIS — I513 Intracardiac thrombosis, not elsewhere classified: Secondary | ICD-10-CM

## 2018-04-15 DIAGNOSIS — I493 Ventricular premature depolarization: Secondary | ICD-10-CM | POA: Diagnosis not present

## 2018-04-15 DIAGNOSIS — I428 Other cardiomyopathies: Secondary | ICD-10-CM | POA: Insufficient documentation

## 2018-04-15 DIAGNOSIS — F17210 Nicotine dependence, cigarettes, uncomplicated: Secondary | ICD-10-CM | POA: Insufficient documentation

## 2018-04-15 DIAGNOSIS — E785 Hyperlipidemia, unspecified: Secondary | ICD-10-CM | POA: Insufficient documentation

## 2018-04-15 LAB — BASIC METABOLIC PANEL
ANION GAP: 6 (ref 5–15)
BUN: 8 mg/dL (ref 8–23)
CALCIUM: 9 mg/dL (ref 8.9–10.3)
CO2: 25 mmol/L (ref 22–32)
Chloride: 108 mmol/L (ref 98–111)
Creatinine, Ser: 0.92 mg/dL (ref 0.61–1.24)
GLUCOSE: 96 mg/dL (ref 70–99)
POTASSIUM: 3.9 mmol/L (ref 3.5–5.1)
SODIUM: 139 mmol/L (ref 135–145)

## 2018-04-15 LAB — CBC
HCT: 35.9 % — ABNORMAL LOW (ref 39.0–52.0)
Hemoglobin: 11 g/dL — ABNORMAL LOW (ref 13.0–17.0)
MCH: 28.6 pg (ref 26.0–34.0)
MCHC: 30.6 g/dL (ref 30.0–36.0)
MCV: 93.2 fL (ref 80.0–100.0)
NRBC: 0 % (ref 0.0–0.2)
PLATELETS: 180 10*3/uL (ref 150–400)
RBC: 3.85 MIL/uL — AB (ref 4.22–5.81)
RDW: 15 % (ref 11.5–15.5)
WBC: 5.1 10*3/uL (ref 4.0–10.5)

## 2018-04-15 LAB — T3, FREE: T3 FREE: 3.5 pg/mL (ref 2.3–4.2)

## 2018-04-15 LAB — T4, FREE: FREE T4: 0.92 ng/dL (ref 0.60–1.60)

## 2018-04-15 LAB — LIPID PANEL
CHOL/HDL RATIO: 2.5 ratio
CHOLESTEROL: 119 mg/dL (ref 0–200)
HDL: 48 mg/dL (ref >40)
LDL CALC: 60 mg/dL (ref 0–99)
Triglycerides: 53 mg/dL (ref <150)
VLDL: 11 mg/dL (ref 0–40)

## 2018-04-15 LAB — TSH: TSH: 2.6 u[IU]/mL (ref 0.35–4.50)

## 2018-04-15 NOTE — Patient Instructions (Signed)
Routine lab work today. Will notify you of abnormal results  Follow up and echocardiogram in 4 months with Dr.McLean Please call our office at [phone] in January to schedule your February appointment.

## 2018-04-15 NOTE — Patient Instructions (Signed)
Please continue Methimazole 5 mg daily.  Please stop at the lab.  Please come back for labs in 6 months and for a new visit in 1 year.

## 2018-04-15 NOTE — Progress Notes (Signed)
Patient ID: Keilen Kahl, male   DOB: 09-16-1945, 72 y.o.   MRN: [id number] PCP: None  Primary Cardiologist: Dr Aundra Dubin INR: [facility].   HPI: Lowen Barringer is a 72 y.o. male smoker with history of myocarditis, systolic heart failure, PVCs, apical blood stasis on coumadin, and former smoker (quit in February 2016).   Admitted to [facility] in 2/16 with dyspnea and found to have probable acute myocarditis. Had LHC and CMRI as noted below. Due to frequent PVCs and NSVT, he was discharged with a Lifevest. Started CHF meds. He refused ICD eventually and Lifevest was taken off.   Dr Aundra Dubin had not seen him for 2 years.  In the mean time, he has actually continued his cardiac meds.  He had a workup for amyloidosis => fat pad biopsy was negative for amyloidosis in 2016.  Given positive SPEP, he saw heme/onc and had bone marrow biopsy that was negative for amyloidosis and did not suggest multiple myeloma.  He had repeat myeloma panel done that was negative.  Conclusion was no myeloma, no AL amyloidosis, no MGUS. PYP scan was done in 12/18, not consistent with transthyretin amyloidosis.   He is being treated by endocrinology with methimazole for toxic multinodular goiter.    Repeat echo was done in 7/18, this showed EF up to normal range.    In 10/18, he was found to have a spontaneous DVT.  He was started on Xarelto.   He returns today for HF follow up. Last seen 06/2017. He was set up for PYP scan and started on Xarelto for spontaneous DVT. Overall doing fine. Denies SOB. Fine with stairs or hills. No edema, orthopnea, or PND. No CP or dizziness. No bleeding on Xarelto. Takes all medications, rarely misses a dose. Has not taken medications this morning. Tries to limit salt. Not weighing, but weight only up 1 lb since last visit. Does not check BP at home.  LHC (09/04/2014): No angiographic CAD. Cardiac MRI (09/05/2014 ): EF 15-20% diffuse hypokinesis, moderate RV systolic dysfunction, concern for early thrombus LV  apex, mid inferior and peri-apical transmural LGE with mid-wall RV LGE => concern for acute myocarditis.  ECHO 08/08/2014 EF 20% LV moderately dilated.  ECG (3/16): NSR, LVH with marked anterolateral T wave inversions Echo (6/16): Moderately dilated LV with EF 25%, normal RV size with mildly decreased systolic function.  Echo (7/18): EF 55-60%, moderate LVH, trivial MR PYP scan (12/18): No evidence for transthyretin amyloidosis.   Labs (3/16): K 3.9, creatinine 1.3, BNP 162, HIV negative, ferritin 133, LDL 112, TSH 0.06, SPEP with monoclonal protein Labs (4/16): LFTs normal, free T3 normal, free T4 normal, TSH 0.187 (low), K 3.9, creatinine 1.03, LDL 60 Labs (5/16): K 4.5, creatinine 1.07, BNP 230 Labs (7/18): K 4.2, creatinine 0.96, NT-proBNP 500 Labs (10/18): hgb 10, K 4.1, creatinine 0.99 Labs (12/18): K 4.3, creatinine 1.12, BNP 33  ECG (personally reviewed): NSR, LVH with inferior and anterolateral TWIs.   Review of systems complete and found to be negative unless listed in HPI.   SH:  Social History   Socioeconomic History  . Marital status: Married    Spouse name: Visua  . Number of children: 2  . Years of education: 10th grade  . Highest education level: Not on file  Occupational History  . 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 Financial resource strain: Not on file  . Food insecurity:    Worry: Not on file  Inability: Not on file  . Transportation needs:    Medical: Not on file    Non-medical: Not on file  Tobacco Use  . Smoking status: Heavy Tobacco Smoker    Packs/day: 1.00    Years: 52.00    Pack years: 52.00  . Smokeless tobacco: Never Used  Substance and Sexual Activity  . Alcohol use: No    Alcohol/week: 0.0 standard drinks    Comment: remote heavy ETOH, none for years  . Drug use: No  . Sexual activity: Not on file  Lifestyle  . Physical activity:    Days per week: Not on file    Minutes per  session: Not on file  . Stress: Not on file  Relationships  . Social connections:    Talks on phone: Not on file    Gets together: Not on file    Attends religious service: Not on file    Active member of club or organization: Not on file    Attends meetings of clubs or organizations: Not on file    Relationship status: Not on file  . Intimate partner violence:    Fear of current or ex partner: Not on file    Emotionally abused: Not on file    Physically abused: Not on file    Forced sexual activity: Not on file  Other Topics Concern  . Not on file  Social History Narrative   Pt lives in [place] with spouse.  Previously worked at [employer].  Retired.  2 grown daughters are healthy.    FH: No cardiomyopathy or premature CAD.   Past Medical History:  Diagnosis Date  . CHF (congestive heart failure) ([place])   . Chronic systolic dysfunction of left ventricle   . Nonischemic cardiomyopathy ([place])   . NSVT (nonsustained ventricular tachycardia) ([place])   . Urinary incontinence     Current Outpatient Medications  Medication Sig Dispense Refill  . atorvastatin (LIPITOR) 40 MG tablet TAKE 1 TABLET BY MOUTH DAILY. Please make overdue appt with Dr. Rayann Heman before anymore refills. 3rd and Final Attempt 15 tablet 0  . carvedilol (COREG) 12.5 MG tablet TAKE 1 AND 1/2 TABLET BY MOUTH TWICE A DAY WITH MEALS 270 tablet 3  . finasteride (PROSCAR) 5 MG tablet Take 1 tablet (5 mg total) by mouth daily. 90 tablet 3  . furosemide (LASIX) 20 MG tablet Take 1 tablet (20 mg total) by mouth every other day. 15 tablet 6  . hydrALAZINE (APRESOLINE) 25 MG tablet Take 1.5 tablets (37.5 mg total) 3 (three) times daily by mouth. 135 tablet 3  . isosorbide mononitrate (IMDUR) 60 MG 24 hr tablet Take 1 tablet (60 mg total) daily by mouth. 30 tablet 3  . methimazole (TAPAZOLE) 5 MG tablet TAKE 1 TABLET BY MOUTH ONCE DAILY 90 tablet 0  . rivaroxaban (XARELTO) 20 MG TABS tablet Take 1 tablet (20 mg  total) by mouth daily with supper. 30 tablet 3  . nitroGLYCERIN (NITROSTAT) 0.4 MG SL tablet Place 1 tablet (0.4 mg total) under the tongue every 5 (five) minutes x 3 doses as needed for chest pain. (Patient not taking: Reported on 04/15/2018) 25 tablet 12  . Rivaroxaban 15 & 20 MG TBPK Take as directed on package: Start with one '15mg'$  tablet by mouth twice a day with food. On Day 22, switch to one '20mg'$  tablet once a day with food. (Patient not taking: Reported on 04/15/2018) 51 each 0   No current facility-administered medications for this encounter.  Vitals:   04/15/18 0842 04/15/18 0843  BP: (!) 151/70 (!) 151/70  Pulse: (!) 56   SpO2: 100%   Weight: 77.9 kg (171 lb 12.8 oz)     PHYSICAL EXAM: General: NAD Neck: No JVD, no thyromegaly or thyroid nodule.  Lungs: Clear to auscultation bilaterally with normal respiratory effort. CV: Nondisplaced PMI.  Heart regular S1/S2, no S3/S4, no murmur.  No peripheral edema.  No carotid bruit.  Normal pedal pulses.  Abdomen: Soft, nontender, no hepatosplenomegaly, no distention.  Skin: Intact without lesions or rashes.  Neurologic: Alert and oriented x 3.  Psych: Normal affect. Extremities: No clubbing or cyanosis.  HEENT: Normal.   ASSESSMENT & PLAN: 1. Chronic Systolic heart Failure: Nonischemic cardiomyopathy. Left heart cath 3/16 showed no angiographic CAD. Cardiac MRI (see description above) was concerning for acute myocarditis with T2 enhancement and multiple areas of mid-wall and transmural LGE. There was also concern for blood stasis/early thrombus in apex so warfarin started. EF 15-20% at that time. ECG and echo appearance not classic for amyloidosis, and the late gadolinium enhancement on MRI seemed to be more in a myocarditis-type pattern.  However, SPEP showed a monoclonal protein => he had extensive evaluation with abdominal fat pad biopsy and bone marrow biopsy, neither showed amyloidosis.  Hematology evaluation was NOT suggestive  of multiple myeloma, MGUS, or AL amyloidosis. PYP scan in 12/18 did not show evidence for transthyretin amyloidosis. Echo 01/2017 showed improvement in LV function to normal range with EF 55-60%. NYHA class I. Volume stable on exam.   - Continue Lasix 20 mg every other day.  BMET today. - Continue Coreg 18.75 mg bid, hydralazine 37.5 TED/Imdur 60.  EF is now normal.  He is off Entresto, not sure why.  With normal EF, will not restart it.  - Will repeat echo at followup appt.  2. Ventricular ectopy: EF now improved beyond ICD range. No change.  3. 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  With recovery of LV function and no apical aneurysm, warfarin had been stopped.  However, with spontaneous DVT, I think that he needs to be on anticoagulation (see below). Continue Xarelto.  4. Hyperlipidemia: Continue statin. Check lipids today 5. Toxic multinodular goiter: Followed by endocrinology. No change.  6. DVT: Diagnosed in 10/18.  Spontaneous (no triggering event).   I think that he needs to be on Xarelto long-term, especially with his prior history of LV thrombus.  - Continue Xarelto 20 mg daily.   BMET, Lipid panel, CBC today.  Echo  Georgiana Shore 04/15/2018  Patient seen with NP, agree with the above note.  He has been doing well recently, no significant dyspnea.  No overt GI bleeding on Xarelto.   Euvolemic on exam.   Check BMET, CBC, lipids today.  He will need a repeat echo, will get at followup in 4 months.   Loralie Champagne 04/16/2018

## 2018-04-15 NOTE — Progress Notes (Signed)
Patient ID: Bryan Lamb, male   DOB: October 01, 1945, 72 y.o.   MRN: [id number]   HPI  Bryan Lamb is a 72 y.o.-year-old male, initially referred by his cardiologist, Dr. 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 Last visit 1 year ago.  He is here with his wife offers part of the history especially related to methimazole dosing and his symptoms.  Reviewed and addended history: His low TSH was discovered during investigation for heart ds.  Pt has a AMI in 09/03/2014. 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 He is followed by Dr. Rayann Heman.  He had a 2D Echo on 12/06/2014:  - Moderately dilated LV with EF 25% (prev. 20% in 09/2014), diffuse hypokinesis. No LVthrombus. Normal RV size with mildly decreased systolic function. No significant valvular abnormalities.  He saw cardiology in 12/2014 and at that visit, he refused an ICD placement. He was advised to wear his external defibrillator (vest), but stopped using it shortly after. He is on Carvedilol.  No SOB, CP, palpitations, syncope.  Thyroid uptake and scan on 01/10/2015: 24 hour radio iodine uptake calculated at 9%, slightly below the normal range.  Imaging of the thyroid gland demonstrates poor tracer localization within the thyroid lobes, with a few small areas of tracer localization seen with additional areas of decreased tracer localization.  Findings suggest presence of an enlarged multinodular thyroid gland though localization is insufficient for adequate characterization.  Potentially the thyroid gland could extend into the superior Mediastinum.  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 Since he did not have hyperthyroid symptoms,  we decided to just follow his thyroid labs.  Based on the results of the uptake and scan, his goiter could extend into the superior mediastinum (no compression symptoms), I repeatedly  suggested (including at today's visit) a thyroid ultrasound to better characterize his goiter.  He repeatedly refused this.  We started a low-dose methimazole refused RAI treatment.  He continues on 5 mg.  No missed doses.  He has a pillbox.  Reviewed pt's TFTs:- last TSH normal, on 5 mg MMI: Lab Results  Component Value Date   TSH 1.72 04/15/2017   TSH 1.23 10/13/2016   TSH 0.81 04/09/2016   TSH 0.20 (L) 10/09/2015   TSH 0.27 (L) 04/08/2015   TSH 0.15 (L) 12/06/2014   TSH 0.187 (L) 10/08/2014   TSH 0.060 (L) 09/04/2014   FREET4 0.96 04/15/2017   FREET4 0.95 10/13/2016   FREET4 0.93 04/09/2016   FREET4 1.17 10/09/2015   FREET4 1.12 04/08/2015   FREET4 1.16 12/06/2014   FREET4 1.05 10/08/2014    Lab Results  Component Value Date   T3FREE 3.0 04/15/2017   T3FREE 3.8 10/13/2016   T3FREE 2.8 04/09/2016   T3FREE 3.4 10/09/2015   T3FREE 3.2 04/08/2015   T3FREE 2.8 12/06/2014   T3FREE 2.8 10/08/2014   TSIs were not elevated: Lab Results  Component Value Date   TSI 36 12/06/2014   Pt denies: - feeling nodules in neck - hoarseness - dysphagia - choking - SOB with lying down  Pt does have a FH of thyroid ds.: sister.No FH of thyroid cancer. No h/o radiation tx to head or neck.  No seaweed or kelp. No recent contrast studies. No herbal supplements. No Biotin use. No recent steroids use.   ROS: Constitutional: no weight gain/no weight loss, no fatigue, no subjective hyperthermia, no subjective hypothermia Eyes: no blurry vision,  no xerophthalmia ENT: no sore throat, + see HPI Cardiovascular: no CP/no SOB/no palpitations/no leg swelling Respiratory: no cough/no SOB/no wheezing Gastrointestinal: no N/no V/no D/no C/no acid reflux Musculoskeletal: no muscle aches/no joint aches Skin: no rashes, no hair loss Neurological: no tremors/no numbness/no tingling/no dizziness  I reviewed pt's medications, allergies, PMH, social hx, family hx, and changes were documented in the  history of present illness. Otherwise, unchanged from my initial visit note.   Past Medical History:  Diagnosis Date  . CHF (congestive heart failure) ([place])   . Chronic systolic dysfunction of left ventricle   . Nonischemic cardiomyopathy ([place])   . NSVT (nonsustained ventricular tachycardia) ([place])   . Urinary incontinence    Past Surgical History:  Procedure Laterality Date  . ANKLE FRACTURE SURGERY    . KNEE SURGERY    . LEFT HEART CATHETERIZATION WITH CORONARY ANGIOGRAM N/A 09/04/2014   Procedure: LEFT HEART CATHETERIZATION WITH CORONARY ANGIOGRAM;  Surgeon: Sanda Klein, MD;  Location: [facility];  Service: Cardiovascular;  Laterality: N/A;   History   Social History  . Marital Status: Married    Spouse Name: N/A  . Number of Children: N/A   Occupational History  . N/a   Social History Main Topics  . Smoking status: Light Tobacco Smoker  . Smokeless tobacco: Not on file  . Alcohol Use: No  . Drug Use: No   Current Outpatient Medications on File Prior to Visit  Medication Sig Dispense Refill  . atorvastatin (LIPITOR) 40 MG tablet TAKE 1 TABLET BY MOUTH DAILY. Please make overdue appt with Dr. Rayann Heman before anymore refills. 3rd and Final Attempt 15 tablet 0  . carvedilol (COREG) 12.5 MG tablet TAKE 1 AND 1/2 TABLET BY MOUTH TWICE A DAY WITH MEALS 270 tablet 3  . finasteride (PROSCAR) 5 MG tablet Take 1 tablet (5 mg total) by mouth daily. 90 tablet 3  . furosemide (LASIX) 20 MG tablet Take 1 tablet (20 mg total) by mouth every other day. 15 tablet 6  . hydrALAZINE (APRESOLINE) 25 MG tablet Take 1.5 tablets (37.5 mg total) 3 (three) times daily by mouth. 135 tablet 3  . isosorbide mononitrate (IMDUR) 60 MG 24 hr tablet Take 1 tablet (60 mg total) daily by mouth. 30 tablet 3  . methimazole (TAPAZOLE) 5 MG tablet TAKE 1 TABLET BY MOUTH ONCE DAILY 90 tablet 0  . nitroGLYCERIN (NITROSTAT) 0.4 MG SL tablet Place 1 tablet (0.4 mg total) under the tongue every 5 (five) minutes x 3  doses as needed for chest pain. 25 tablet 12  . rivaroxaban (XARELTO) 20 MG TABS tablet Take 1 tablet (20 mg total) by mouth daily with supper. 30 tablet 3  . Rivaroxaban 15 & 20 MG TBPK Take as directed on package: Start with one 15mg  tablet by mouth twice a day with food. On Day 22, switch to one 20mg  tablet once a day with food. 51 each 0   No current facility-administered medications on file prior to visit.    No Known Allergies   FH: - see HPI  PE: BP 118/70   Pulse 70   Ht 5\' 7"  (1.702 m)   Wt 170 lb (77.1 kg)   SpO2 98%   BMI 26.63 kg/m  Body mass index is 26.63 kg/m. Wt Readings from Last 3 Encounters:  04/15/18 170 lb (77.1 kg)  04/15/18 171 lb 12.8 oz (77.9 kg)  06/11/17 170 lb 12.8 oz (77.5 kg)   Constitutional: slightly overweight, in NAD Eyes: PERRLA,  EOMI, no exophthalmos ENT: moist mucous membranes, + L thyroid fulness, no cervical lymphadenopathy Cardiovascular: RRR, No MRG Respiratory: CTA B Gastrointestinal: abdomen soft, NT, ND, BS+ Musculoskeletal: no deformities, strength intact in all 4 Skin: moist, warm, no rashes Neurological: no tremor with outstretched hands, DTR normal in all 4  ASSESSMENT: 1. Subclinical Thyrotoxicosis  2. Goiter  PLAN:  1. Patient with h/o TMNG, with subclinical thyrotoxicosis, now controlled on low-dose methimazole, 5 mg daily.  He is also on carvedilol per cardiology.  He refused further investigation of his goiter or RAI treatment.  He is asymptomatic, without weight loss, palpitations, tremors, anxiety.  However, he does have CHF, initially with an EF of 25%, however, per the 2D echo from 01/2017 his EF has increased to 55 to 60%.  He refused an ICD in the past.  No chest pain or shortness of breath. He had another 2D Echo today >> results pending. -We discussed in the past about different treatment options for his toxic multinodular goiter but he preferred to continue with a low-dose methimazole, with which he is completely  compliant (wife is organizing his pillbox). -We will check his TFTs today and will change the methimazole dose depending on the results -I will see him back in a year, but I advised him to come back in 6 months for a repeat of his TFTs.    2. Goiter  - he has a MNG descending in the mediastinum, however, he refuses further investigation with thyroid U/S - denies SOB, choking, dysphagia or hoarseness - will continue to follow him clinically for this  Results for orders placed or performed in visit on 04/15/18  T3, free  Result Value Ref Range   T3, Free 3.5 2.3 - 4.2 pg/mL  T4, free  Result Value Ref Range   Free T4 0.92 0.60 - 1.60 ng/dL  TSH  Result Value Ref Range   TSH 2.60 0.35 - 4.50 uIU/mL   Thyroid tests are excellent, so he can continue the current dose of methimazole, 5 mg daily, but will have him back for another set of labs in 6 months, as we discussed.  Philemon Kingdom, MD PhD [facility]

## 2018-04-19 ENCOUNTER — Telehealth: Payer: Self-pay

## 2018-04-19 ENCOUNTER — Other Ambulatory Visit: Payer: Self-pay | Admitting: Internal Medicine

## 2018-04-19 NOTE — Telephone Encounter (Signed)
Left message for patient to return our call at [phone].  

## 2018-04-19 NOTE — Telephone Encounter (Signed)
-----   Message from Philemon Kingdom, MD sent at 04/15/2018  3:24 PM EDT ----- Lenna Sciara, can you please call pt: Thyroid tests are excellent, so he can continue the current dose of methimazole, 5 mg daily, but let us have him back for another set of labs in 6 months, as we discussed.

## 2018-04-22 NOTE — Telephone Encounter (Signed)
Letter sent.

## 2018-04-27 ENCOUNTER — Other Ambulatory Visit: Payer: Self-pay | Admitting: Family Medicine

## 2018-04-27 DIAGNOSIS — R399 Unspecified symptoms and signs involving the genitourinary system: Secondary | ICD-10-CM

## 2018-04-27 NOTE — Telephone Encounter (Signed)
Copied from [place] [id number]. Topic: Quick Communication - Rx Refill/Question >> Apr 27, 2018 11:06 AM Rutherford Nail, NT wrote: **Patient scheduled to come see Dr Carlota Raspberry on 05/24/18 at 2:00pm. Would like to know if enough medication could be sent to the pharmacy to last until appointment?**   Medication: finasteride (PROSCAR) 5 MG tablet  Has the patient contacted their pharmacy? Yes.   (Agent: If no, request that the patient contact the pharmacy for the refill.) (Agent: If yes, when and what did the pharmacy advise?)  Preferred Pharmacy (with phone number or street name): [facility] [place], [place]: Please be advised that RX refills may take up to 3 business days. We ask that you follow-up with your pharmacy.

## 2018-04-27 NOTE — Telephone Encounter (Signed)
Requested medication (s) are due for refill today: yes  Requested medication (s) are on the active medication list: yes  Last refill:  04/21/17 Historical provider  Future visit scheduled: yes  Dr Carlota Raspberry 05/24/18  Notes to clinic:   Requesting refill to get him to his visit    Requested Prescriptions  Pending Prescriptions Disp Refills   finasteride (PROSCAR) 5 MG tablet 90 tablet 3    Sig: Take 1 tablet (5 mg total) by mouth daily.     Urology: 5-alpha Reductase Inhibitors Failed - 04/27/2018 11:12 AM      Failed - Valid encounter within last 12 months    Recent Outpatient Visits          1 year ago Dysuria   Primary Care at [facility], [place], [place]   1 year ago Bilateral lower extremity edema   Primary Care at [facility], [place], [place]   1 year ago Bilateral lower extremity edema   Primary Care at [facility], [place], [place]   1 year ago Dermatitis   Primary Care at [facility], [place], [place]      Future Appointments            In 3 weeks Carlota Raspberry, Ranell Patrick, MD Primary Care at [place], [facility]

## 2018-04-28 MED ORDER — FINASTERIDE 5 MG PO TABS: 5.0000 mg | ORAL_TABLET | Freq: Every day | ORAL | 0 refills | Status: DC

## 2018-05-04 ENCOUNTER — Other Ambulatory Visit: Payer: Self-pay | Admitting: Internal Medicine

## 2018-05-24 ENCOUNTER — Ambulatory Visit: Payer: Medicare Other | Admitting: Family Medicine

## 2018-06-10 ENCOUNTER — Ambulatory Visit (INDEPENDENT_AMBULATORY_CARE_PROVIDER_SITE_OTHER): Payer: Medicare HMO | Admitting: Family Medicine

## 2018-06-10 ENCOUNTER — Encounter: Payer: Self-pay | Admitting: Family Medicine

## 2018-06-10 VITALS — BP 140/76 | HR 63 | Temp 98.6°F | Resp 16 | Ht 67.0 in | Wt 169.0 lb

## 2018-06-10 DIAGNOSIS — E785 Hyperlipidemia, unspecified: Secondary | ICD-10-CM

## 2018-06-10 DIAGNOSIS — Z1211 Encounter for screening for malignant neoplasm of colon: Secondary | ICD-10-CM | POA: Diagnosis not present

## 2018-06-10 DIAGNOSIS — Z1159 Encounter for screening for other viral diseases: Secondary | ICD-10-CM | POA: Diagnosis not present

## 2018-06-10 MED ORDER — ATORVASTATIN CALCIUM 40 MG PO TABS: ORAL_TABLET | ORAL | 1 refills | Status: DC

## 2018-06-10 NOTE — Patient Instructions (Addendum)
Thank you for coming in today.  I refilled the Lipitor at same dose.  Please follow-up in the next 3 months to establish with new primary care provider here.  I did go ahead and refer you for colonoscopy and check for hepatitis C screening test.  I recommend pneumonia and flu vaccines, let me know if you change your mind on those or if you have any questions.    If you have lab work done today you will be contacted with your lab results within the next 2 weeks.  If you have not heard from [place] then please contact us. The fastest way to get your results is to register for My Chart.   IF you received an x-ray today, you will receive an invoice from [facility]. Please contact [facility] at [phone] with questions or concerns regarding your invoice.   IF you received labwork today, you will receive an invoice from [place]. Please contact [facility] at [phone] with questions or concerns regarding your invoice.   Our billing staff will not be able to assist you with questions regarding bills from these companies.  You will be contacted with the lab results as soon as they are available. The fastest way to get your results is to activate your My Chart account. Instructions are located on the last page of this paperwork. If you have not heard from [place] regarding the results in 2 weeks, please contact this office.

## 2018-06-10 NOTE — Progress Notes (Signed)
Subjective:    Patient ID: Bryan Lamb, male    DOB: 12/07/1945, 72 y.o.   MRN: [id number]  HPI Bryan Lamb is a 72 y.o. male Presents today for: Chief Complaint  Patient presents with  . Medication Refill    lipitor   New patient to me.  Here for Lipitor refill.  Has multiple medical problems, followed by cardiology for chronic systolic heart failure endocrinology for chronic systolic heart failure, PVCs, apical blood stasis on Coumadin.  Followed by endocrinology for goiter, hyperthyroidism.  Hyperlipidemia:  Lab Results  Component Value Date   CHOL 119 04/15/2018   HDL 48 04/15/2018   LDLCALC 60 04/15/2018   TRIG 53 04/15/2018   CHOLHDL 2.5 04/15/2018   Lab Results  Component Value Date   ALT 16 01/14/2015   AST 17 01/14/2015   ALKPHOS 70 01/14/2015   BILITOT 0.82 01/14/2015  Here for medication refill of Lipitor.  Takes 40 mg daily. No new side effects.  Taking QD.   Health maintenance Due for flu vaccine, pneumonia vaccine, colonoscopy, hep C screening. Agrees to see GI  Declines flu and pneumonia vaccines. Does not have questions, but want s to decline.   Patient Active Problem List   Diagnosis Date Noted  . Chronic allergic rhinitis 04/16/2017  . 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 Hypertension 02/26/2015  . Monoclonal gammopathy 01/14/2015  . Amyloidosis ([place])   . 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 Chronic anticoagulation 09/13/2014  . Encounter for therapeutic drug monitoring 09/10/2014  . Acute myocarditis 09/07/2014  . Nonischemic dilated cardiomyopathy ([place]) 09/05/2014  . Acute systolic CHF (congestive heart failure) ([place]) 09/05/2014  . Tobacco abuse 09/04/2014  . Dyslipidemia 09/04/2014  . Elevated troponin 09/03/2014  . Lower urinary tract symptoms    Past Medical History:  Diagnosis Date  . CHF (congestive heart failure) ([place])   . Chronic systolic dysfunction of left ventricle   . Nonischemic cardiomyopathy ([place])   . NSVT  (nonsustained ventricular tachycardia) ([place])   . Urinary incontinence    Past Surgical History:  Procedure Laterality Date  . ANKLE FRACTURE SURGERY    . KNEE SURGERY    . LEFT HEART CATHETERIZATION WITH CORONARY ANGIOGRAM N/A 09/04/2014   Procedure: LEFT HEART CATHETERIZATION WITH CORONARY ANGIOGRAM;  Surgeon: Sanda Klein, MD;  Location: [facility] CATH LAB;  Service: Cardiovascular;  Laterality: N/A;   No Known Allergies Prior to Admission medications   Medication Sig Start Date End Date Taking? Authorizing Provider  atorvastatin (LIPITOR) 40 MG tablet TAKE 1 TABLET BY MOUTH DAILY. 06/10/18  Yes Wendie Agreste, MD  carvedilol (COREG) 12.5 MG tablet TAKE 1 AND 1/2 TABLET BY MOUTH TWICE A DAY WITH MEALS 02/14/18  Yes Allred, Jeneen Rinks, MD  finasteride (PROSCAR) 5 MG tablet Take 1 tablet (5 mg total) by mouth daily. 04/28/18  Yes Wendie Agreste, MD  furosemide (LASIX) 20 MG tablet Take 1 tablet (20 mg total) by mouth every other day. 04/27/17 04/16/19 Yes Arbutus Leas, NP  isosorbide mononitrate (IMDUR) 60 MG 24 hr tablet Take 1 tablet (60 mg total) daily by mouth. 05/13/17 04/16/19 Yes Larey Dresser, MD  methimazole (TAPAZOLE) 5 MG tablet TAKE 1 TABLET BY MOUTH ONCE DAILY 04/19/18  Yes Philemon Kingdom, MD  nitroGLYCERIN (NITROSTAT) 0.4 MG SL tablet Place 1 tablet (0.4 mg total) under the tongue every 5 (five) minutes x 3 doses as needed for chest pain. 09/07/14  Yes Barrett, Evelene Croon, PA-C  rivaroxaban (XARELTO) 20 MG TABS tablet Take  1 tablet (20 mg total) by mouth daily with supper. 02/11/18  Yes Larey Dresser, MD  Rivaroxaban 15 & 20 MG TBPK Take as directed on package: Start with one 15mg  tablet by mouth twice a day with food. On Day 22, switch to one 20mg  tablet once a day with food. 04/09/17  Yes Deno Etienne, DO  hydrALAZINE (APRESOLINE) 25 MG tablet Take 1.5 tablets (37.5 mg total) 3 (three) times daily by mouth. Patient not taking: Reported on 04/15/2018 05/13/17 04/16/19  Larey Dresser,  MD   Social History   Socioeconomic History  . Marital status: Married    Spouse name: Visua  . Number of children: 2  . Years of education: 10th grade  . Highest education level: Not on file  Occupational History  . 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 Financial resource strain: Not on file  . Food insecurity:    Worry: Not on file    Inability: Not on file  . Transportation needs:    Medical: Not on file    Non-medical: Not on file  Tobacco Use  . Smoking status: Heavy Tobacco Smoker    Packs/day: 1.00    Years: 52.00    Pack years: 52.00  . Smokeless tobacco: Never Used  Substance and Sexual Activity  . Alcohol use: No    Alcohol/week: 0.0 standard drinks    Comment: remote heavy ETOH, none for years  . Drug use: No  . Sexual activity: Not on file  Lifestyle  . Physical activity:    Days per week: Not on file    Minutes per session: Not on file  . Stress: Not on file  Relationships  . Social connections:    Talks on phone: Not on file    Gets together: Not on file    Attends religious service: Not on file    Active member of club or organization: Not on file    Attends meetings of clubs or organizations: Not on file    Relationship status: Not on file  . Intimate partner violence:    Fear of current or ex partner: Not on file    Emotionally abused: Not on file    Physically abused: Not on file    Forced sexual activity: Not on file  Other Topics Concern  . Not on file  Social History Narrative   Pt lives in [place] with spouse.  Previously worked at [employer].  Retired.  2 grown daughters are healthy.     Review of Systems  Constitutional: Negative for fatigue and unexpected weight change.  Eyes: Negative for visual disturbance.  Respiratory: Negative for cough, chest tightness and shortness of breath.   Cardiovascular: Negative for chest pain, palpitations and leg swelling.    Gastrointestinal: Negative for abdominal pain and blood in stool.  Neurological: Negative for dizziness, light-headedness and headaches.   No new myalgias.     Objective:   Physical Exam  Constitutional: He is oriented to person, place, and time. He appears well-developed and well-nourished.  HENT:  Head: Normocephalic and atraumatic.  Eyes: Pupils are equal, round, and reactive to light. EOM are normal.  Neck: No JVD present. Carotid bruit is not present.  Cardiovascular: Normal rate, regular rhythm and normal heart sounds.  No murmur heard. Pulmonary/Chest: Effort normal and breath sounds normal. He has no rales.  Musculoskeletal: He exhibits no edema.  Neurological: He is alert and oriented  to person, place, and time.  Skin: Skin is warm and dry.  Psychiatric: He has a normal mood and affect.  Vitals reviewed.    Vitals:   06/10/18 1012 06/10/18 1013  BP: (!) 154/81 140/76  Pulse: 63   Resp: 16   Temp: 98.6 F (37 C)   TempSrc: Oral   SpO2: 97%   Weight: 169 lb (76.7 kg)   Height: 5\' 7"  (1.702 m)        Assessment & Plan:   Bryan Lamb is a 72 y.o. male Hyperlipidemia, unspecified hyperlipidemia type - Plan: Hepatic Function Panel, atorvastatin (LIPITOR) 40 MG tablet  -Tolerating statin, continue same dose.  Due for hepatic function panel, but recent lipid testing looked good with LDL less than 70.  Refilled Lipitor same dose  Health maintenance Screen for colon cancer - Plan: Ambulatory referral to Gastroenterology Need for hepatitis C screening test - Plan: Hepatitis C antibody  -Recommended pneumonia and flu vaccines, declined.  Attempted discussion to identify barriers to these vaccines, but none given.  Still recommended.  -Refer to gastroenterology for colonoscopy.  Agreed to appointment.  -Check hep C screening test  -Plan to follow-up within the next few months to establish primary care provider.  Meds ordered this encounter  Medications  .  atorvastatin (LIPITOR) 40 MG tablet    Sig: TAKE 1 TABLET BY MOUTH DAILY.    Dispense:  90 tablet    Refill:  1   Patient Instructions   Thank you for coming in today.  I refilled the Lipitor at same dose.  Please follow-up in the next 3 months to establish with new primary care provider here.  I did go ahead and refer you for colonoscopy and check for hepatitis C screening test.  I recommend pneumonia and flu vaccines, let me know if you change your mind on those or if you have any questions.    If you have lab work done today you will be contacted with your lab results within the next 2 weeks.  If you have not heard from [place] then please contact us. The fastest way to get your results is to register for My Chart.   IF you received an x-ray today, you will receive an invoice from [facility]. Please contact [facility] at [phone] with questions or concerns regarding your invoice.   IF you received labwork today, you will receive an invoice from [place]. Please contact [facility] at [phone] with questions or concerns regarding your invoice.   Our billing staff will not be able to assist you with questions regarding bills from these companies.  You will be contacted with the lab results as soon as they are available. The fastest way to get your results is to activate your My Chart account. Instructions are located on the last page of this paperwork. If you have not heard from [place] regarding the results in 2 weeks, please contact this office.    Signed,   Merri Ray, MD Primary Care at [place].  06/10/18 10:41 AM

## 2018-06-11 LAB — HEPATIC FUNCTION PANEL
ALT: 10 IU/L (ref 0–44)
AST: 16 IU/L (ref 0–40)
Albumin: 3.9 g/dL (ref 3.5–4.8)
Alkaline Phosphatase: 91 IU/L (ref 39–117)
BILIRUBIN, DIRECT: 0.11 mg/dL (ref 0.00–0.40)
Bilirubin Total: 0.4 mg/dL (ref 0.0–1.2)
Total Protein: 7 g/dL (ref 6.0–8.5)

## 2018-06-11 LAB — HEPATITIS C ANTIBODY

## 2018-06-22 ENCOUNTER — Other Ambulatory Visit (HOSPITAL_COMMUNITY): Payer: Self-pay | Admitting: *Deleted

## 2018-06-22 MED ORDER — RIVAROXABAN 20 MG PO TABS: 20.0000 mg | ORAL_TABLET | Freq: Every day | ORAL | 3 refills | Status: DC

## 2018-07-21 ENCOUNTER — Other Ambulatory Visit: Payer: Self-pay | Admitting: Family Medicine

## 2018-07-21 ENCOUNTER — Other Ambulatory Visit: Payer: Self-pay | Admitting: Internal Medicine

## 2018-07-21 DIAGNOSIS — R399 Unspecified symptoms and signs involving the genitourinary system: Secondary | ICD-10-CM

## 2018-07-21 NOTE — Telephone Encounter (Signed)
Requested Prescriptions  Pending Prescriptions Disp Refills  . finasteride (PROSCAR) 5 MG tablet [Pharmacy Med Name: FINASTERIDE 5MG  TABLETS] 90 tablet 0    Sig: TAKE 1 TABLET(5 MG) BY MOUTH DAILY     Urology: 5-alpha Reductase Inhibitors Passed - 07/21/2018 12:45 PM      Passed - Valid encounter within last 12 months    Recent Outpatient Visits          1 month ago Hyperlipidemia, unspecified hyperlipidemia type   Primary Care at [facility], Ranell Patrick, MD   1 year ago Dysuria   Primary Care at [facility], [place], [place]   1 year ago Bilateral lower extremity edema   Primary Care at [facility], [place], [place]   1 year ago Bilateral lower extremity edema   Primary Care at [facility], [place], [place]   1 year ago Dermatitis   Primary Care at [facility], [place], [place]      Future Appointments            In 1 month Sagardia, Ines Bloomer, MD Primary Care at [place], [facility]

## 2018-08-02 ENCOUNTER — Other Ambulatory Visit: Payer: Self-pay | Admitting: Internal Medicine

## 2018-08-02 ENCOUNTER — Other Ambulatory Visit: Payer: Self-pay | Admitting: Family Medicine

## 2018-08-02 DIAGNOSIS — R399 Unspecified symptoms and signs involving the genitourinary system: Secondary | ICD-10-CM

## 2018-08-02 NOTE — Telephone Encounter (Addendum)
Refill request for finasteride; last refill noted 07/21/2018 at [facility]; spoke with Juliann Pulse, Pharmacist, and she verifies that this prescription was filled on 07/21/2018.

## 2018-08-30 ENCOUNTER — Encounter: Payer: Self-pay | Admitting: Family Medicine

## 2018-09-08 ENCOUNTER — Encounter: Payer: Self-pay | Admitting: Emergency Medicine

## 2018-09-08 ENCOUNTER — Ambulatory Visit (INDEPENDENT_AMBULATORY_CARE_PROVIDER_SITE_OTHER): Payer: Medicare HMO | Admitting: Emergency Medicine

## 2018-09-08 ENCOUNTER — Other Ambulatory Visit: Payer: Self-pay

## 2018-09-08 VITALS — BP 157/76 | HR 72 | Temp 98.4°F | Resp 16 | Ht 67.0 in | Wt 161.8 lb

## 2018-09-08 DIAGNOSIS — E859 Amyloidosis, unspecified: Secondary | ICD-10-CM

## 2018-09-08 DIAGNOSIS — I509 Heart failure, unspecified: Secondary | ICD-10-CM | POA: Diagnosis not present

## 2018-09-08 DIAGNOSIS — I10 Essential (primary) hypertension: Secondary | ICD-10-CM

## 2018-09-08 DIAGNOSIS — Z7901 Long term (current) use of anticoagulants: Secondary | ICD-10-CM

## 2018-09-08 DIAGNOSIS — I420 Dilated cardiomyopathy: Secondary | ICD-10-CM | POA: Diagnosis not present

## 2018-09-08 DIAGNOSIS — E785 Hyperlipidemia, unspecified: Secondary | ICD-10-CM | POA: Diagnosis not present

## 2018-09-08 DIAGNOSIS — E0590 Thyrotoxicosis, unspecified without thyrotoxic crisis or storm: Secondary | ICD-10-CM

## 2018-09-08 MED ORDER — NITROGLYCERIN 0.4 MG SL SUBL: 0.4000 mg | SUBLINGUAL_TABLET | SUBLINGUAL | 12 refills | Status: AC | PRN

## 2018-09-08 NOTE — Progress Notes (Signed)
Bryan Lamb 73 y.o.   Chief Complaint  Patient presents with  . Establish Care    medication refill - Nitroglycerin  . Hyperlipidemia    check cholesterol    HISTORY OF PRESENT ILLNESS: This is a 73 y.o. male here to establish care.  First visit with me.  Has multiple medical problems on multiple medications including Xarelto.  Has no complaints or medical concerns today.  Needs to have cholesterol checked and nitroglycerin refilled. Patient Active Problem List   Diagnosis Date Noted  . Chronic allergic rhinitis 04/16/2017  . 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 Hypertension 02/26/2015  . Monoclonal gammopathy 01/14/2015  . Amyloidosis ([place])   . 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 Chronic anticoagulation 09/13/2014  . 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 Acute systolic CHF (congestive heart failure) ([place]) 09/05/2014  . Tobacco abuse 09/04/2014  . Dyslipidemia 09/04/2014  . Lower urinary tract symptoms      HPI   Prior to Admission medications   Medication Sig Start Date End Date Taking? Authorizing Provider  atorvastatin (LIPITOR) 40 MG tablet TAKE 1 TABLET BY MOUTH DAILY. 06/10/18  Yes Wendie Agreste, MD  carvedilol (COREG) 12.5 MG tablet TAKE 1 AND 1/2 TABLET BY MOUTH TWICE A DAY WITH MEALS 02/14/18  Yes Allred, Jeneen Rinks, MD  finasteride (PROSCAR) 5 MG tablet TAKE 1 TABLET(5 MG) BY MOUTH DAILY 07/21/18  Yes Wendie Agreste, MD  furosemide (LASIX) 20 MG tablet Take 1 tablet (20 mg total) by mouth every other day. 04/27/17 04/16/19 Yes Arbutus Leas, NP  isosorbide mononitrate (IMDUR) 60 MG 24 hr tablet Take 1 tablet (60 mg total) daily by mouth. 05/13/17 04/16/19 Yes Larey Dresser, MD  methimazole (TAPAZOLE) 5 MG tablet TAKE 1 TABLET BY MOUTH EVERY DAY 08/02/18  Yes Philemon Kingdom, MD  nitroGLYCERIN (NITROSTAT) 0.4 MG SL tablet Place 1 tablet (0.4 mg total) under the tongue every 5 (five) minutes x 3 doses as needed for chest pain. 09/08/18  Yes  Etienne Mowers, Ines Bloomer, MD  rivaroxaban (XARELTO) 20 MG TABS tablet Take 1 tablet (20 mg total) by mouth daily with supper. 06/22/18  Yes Larey Dresser, MD  hydrALAZINE (APRESOLINE) 25 MG tablet Take 1.5 tablets (37.5 mg total) 3 (three) times daily by mouth. Patient not taking: Reported on 09/08/2018 05/13/17 04/16/19  Larey Dresser, MD    No Known Allergies  Patient Active Problem List   Diagnosis Date Noted  . Chronic allergic rhinitis 04/16/2017  . 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 Hypertension 02/26/2015  . Monoclonal gammopathy 01/14/2015  . Amyloidosis ([place])   . 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 Chronic anticoagulation 09/13/2014  . 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 Acute systolic CHF (congestive heart failure) ([place]) 09/05/2014  . Tobacco abuse 09/04/2014  . Dyslipidemia 09/04/2014  . Lower urinary tract symptoms     Past Medical History:  Diagnosis Date  . CHF (congestive heart failure) ([place])   . Chronic systolic dysfunction of left ventricle   . Nonischemic cardiomyopathy ([place])   . NSVT (nonsustained ventricular tachycardia) ([place])   . Urinary incontinence     Past Surgical History:  Procedure Laterality Date  . ANKLE FRACTURE SURGERY    . KNEE SURGERY    . LEFT HEART CATHETERIZATION WITH CORONARY ANGIOGRAM N/A 09/04/2014   Procedure: LEFT HEART CATHETERIZATION WITH CORONARY ANGIOGRAM;  Surgeon: Sanda Klein, MD;  Location: [facility];  Service: Cardiovascular;  Laterality: N/A;    Social History   Socioeconomic History  .  Marital status: Married    Spouse name: Visua  . Number of children: 2  . Years of education: 10th grade  . Highest education level: Not on file  Occupational History  . 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 Financial resource strain: Not on file  . Food insecurity:    Worry: Not on file    Inability: Not on file  . Transportation needs:     Medical: Not on file    Non-medical: Not on file  Tobacco Use  . Smoking status: Heavy Tobacco Smoker    Packs/day: 1.00    Years: 52.00    Pack years: 52.00  . Smokeless tobacco: Never Used  Substance and Sexual Activity  . Alcohol use: No    Alcohol/week: 0.0 standard drinks    Comment: remote heavy ETOH, none for years  . Drug use: No  . Sexual activity: Not on file  Lifestyle  . Physical activity:    Days per week: Not on file    Minutes per session: Not on file  . Stress: Not on file  Relationships  . Social connections:    Talks on phone: Not on file    Gets together: Not on file    Attends religious service: Not on file    Active member of club or organization: Not on file    Attends meetings of clubs or organizations: Not on file    Relationship status: Not on file  . Intimate partner violence:    Fear of current or ex partner: Not on file    Emotionally abused: Not on file    Physically abused: Not on file    Forced sexual activity: Not on file  Other Topics Concern  . Not on file  Social History Narrative   Pt lives in [place] with spouse.  Previously worked at [employer].  Retired.  2 grown daughters are healthy.    Family History  Problem Relation Age of Onset  . Heart disease Unknown   . Stroke Unknown   . Cancer Sister        breast cancer     Review of Systems  Constitutional: Negative.  Negative for chills, fever and weight loss.  HENT: Negative.   Eyes: Negative.  Negative for blurred vision and double vision.  Respiratory: Negative.  Negative for cough and shortness of breath.   Cardiovascular: Negative.  Negative for chest pain and palpitations.  Gastrointestinal: Negative.   Genitourinary: Negative.  Negative for dysuria.  Musculoskeletal: Negative.   Skin: Negative.  Negative for rash.  Neurological: Negative.  Negative for dizziness and headaches.  Endo/Heme/Allergies: Negative.   All other systems reviewed and are  negative.  Vitals:   09/08/18 0924  BP: (!) 157/76  Pulse: 72  Resp: 16  Temp: 98.4 F (36.9 C)  SpO2: 96%     Physical Exam Vitals signs reviewed.  Constitutional:      Appearance: Normal appearance.  HENT:     Head: Normocephalic and atraumatic.     Nose: Nose normal.     Mouth/Throat:     Mouth: Mucous membranes are moist.     Pharynx: Oropharynx is clear.  Eyes:     Extraocular Movements: Extraocular movements intact.     Conjunctiva/sclera: Conjunctivae normal.     Pupils: Pupils are equal, round, and reactive to light.  Neck:     Musculoskeletal: Normal range of motion and neck supple.  Cardiovascular:     Rate and Rhythm: Normal rate and regular rhythm.     Heart sounds: Normal heart sounds.  Pulmonary:     Effort: Pulmonary effort is normal.     Breath sounds: Normal breath sounds.  Abdominal:     Palpations: Abdomen is soft.     Tenderness: There is no abdominal tenderness.  Musculoskeletal: Normal range of motion.  Skin:    General: Skin is warm and dry.     Capillary Refill: Capillary refill takes less than 2 seconds.  Neurological:     General: No focal deficit present.     Mental Status: He is alert and oriented to person, place, and time.  Psychiatric:        Mood and Affect: Mood normal.        Behavior: Behavior normal.    A total of 25 minutes was spent in the room with the patient, greater than 50% of which was in counseling/coordination of care regarding chronic medical conditions, review of medical history and problem list, medications, prognosis, review of labs, and need for follow-up.   ASSESSMENT & PLAN: Clinically stable, no concerns identified today. [place] was seen today for establish care and hyperlipidemia.  Diagnoses and all orders for this visit:  Essential hypertension -     Comprehensive metabolic panel -     CBC with Differential/Platelet  Hyperlipidemia, unspecified hyperlipidemia type -     Lipid  panel  Nonischemic dilated cardiomyopathy (HCC) -     Comprehensive metabolic panel  Chronic anticoagulation -     CBC with Differential/Platelet  Amyloidosis, unspecified type (HCC)  Chronic congestive heart failure, unspecified heart failure type (HCC) -     Comprehensive metabolic panel  Subclinical hyperthyroidism -     TSH  Other orders -     nitroGLYCERIN (NITROSTAT) 0.4 MG SL tablet; Place 1 tablet (0.4 mg total) under the tongue every 5 (five) minutes x 3 doses as needed for chest pain.    Patient Instructions   [facility] phone number is: [phone], patient was referred for colonoscopy.    If you have lab work done today you will be contacted with your lab results within the next 2 weeks.  If you have not heard from [place] then please contact us. The fastest way to get your results is to register for My Chart.   IF you received an x-ray today, you will receive an invoice from [facility]. Please contact [facility] at [phone] with questions or concerns regarding your invoice.   IF you received labwork today, you will receive an invoice from [facility]. Please contact [facility] at [phone] with questions or concerns regarding your invoice.   Our billing staff will not be able to assist you with questions regarding bills from these companies.  You will be contacted with the lab results as soon as they are available. The fastest way to get your results is to activate your My Chart account. Instructions are located on the last page of this paperwork. If you have not heard from [place] regarding the results in 2 weeks, please contact this office.     Hypertension Hypertension, commonly called high blood pressure, is when the force of blood pumping through the arteries is too strong. The arteries are the blood vessels that carry blood from the heart throughout the body. Hypertension forces the heart to work harder to pump blood and may cause arteries to  become narrow or stiff. Having untreated or uncontrolled hypertension  can cause heart attacks, strokes, kidney disease, and other problems. A blood pressure reading consists of a higher number over a lower number. Ideally, your blood pressure should be below 120/80. The first ("top") number is called the systolic pressure. It is a measure of the pressure in your arteries as your heart beats. The second ("bottom") number is called the diastolic pressure. It is a measure of the pressure in your arteries as the heart relaxes. What are the causes? The cause of this condition is not known. What increases the risk? Some risk factors for high blood pressure are under your control. Others are not. Factors you can change  Smoking.  Having type 2 diabetes mellitus, high cholesterol, or both.  Not getting enough exercise or physical activity.  Being overweight.  Having too much fat, sugar, calories, or salt (sodium) in your diet.  Drinking too much alcohol. Factors that are difficult or impossible to change  Having chronic kidney disease.  Having a family history of high blood pressure.  Age. Risk increases with age.  Race. You may be at higher risk if you are African-American.  Gender. Men are at higher risk than women before age 79. After age 40, women are at higher risk than men.  Having obstructive sleep apnea.  Stress. What are the signs or symptoms? Extremely high blood pressure (hypertensive crisis) may cause:  Headache.  Anxiety.  Shortness of breath.  Nosebleed.  Nausea and vomiting.  Severe chest pain.  Jerky movements you cannot control (seizures). How is this diagnosed? This condition is diagnosed by measuring your blood pressure while you are seated, with your arm resting on a surface. The cuff of the blood pressure monitor will be placed directly against the skin of your upper arm at the level of your heart. It should be measured at least twice using the same  arm. Certain conditions can cause a difference in blood pressure between your right and left arms. Certain factors can cause blood pressure readings to be lower or higher than normal (elevated) for a short period of time:  When your blood pressure is higher when you are in a health care provider's office than when you are at home, this is called white coat hypertension. Most people with this condition do not need medicines.  When your blood pressure is higher at home than when you are in a health care provider's office, this is called masked hypertension. Most people with this condition may need medicines to control blood pressure. If you have a high blood pressure reading during one visit or you have normal blood pressure with other risk factors:  You may be asked to return on a different day to have your blood pressure checked again.  You may be asked to monitor your blood pressure at home for 1 week or longer. If you are diagnosed with hypertension, you may have other blood or imaging tests to help your health care provider understand your overall risk for other conditions. How is this treated? This condition is treated by making healthy lifestyle changes, such as eating healthy foods, exercising more, and reducing your alcohol intake. Your health care provider may prescribe medicine if lifestyle changes are not enough to get your blood pressure under control, and if:  Your systolic blood pressure is above 130.  Your diastolic blood pressure is above 80. Your personal target blood pressure may vary depending on your medical conditions, your age, and other factors. Follow these instructions at home: Eating and drinking  Eat a diet that is high in fiber and potassium, and low in sodium, added sugar, and fat. An example eating plan is called the DASH (Dietary Approaches to Stop Hypertension) diet. To eat this way: ? Eat plenty of fresh fruits and vegetables. Try to fill half of your plate at  each meal with fruits and vegetables. ? Eat whole grains, such as whole wheat pasta, brown rice, or whole grain bread. Fill about one quarter of your plate with whole grains. ? Eat or drink low-fat dairy products, such as skim milk or low-fat yogurt. ? Avoid fatty cuts of meat, processed or cured meats, and poultry with skin. Fill about one quarter of your plate with lean proteins, such as fish, chicken without skin, beans, eggs, and tofu. ? Avoid premade and processed foods. These tend to be higher in sodium, added sugar, and fat.  Reduce your daily sodium intake. Most people with hypertension should eat less than 1,500 mg of sodium a day.  Limit alcohol intake to no more than 1 drink a day for nonpregnant women and 2 drinks a day for men. One drink equals 12 oz of beer, 5 oz of wine, or 1 oz of hard liquor. Lifestyle   Work with your health care provider to maintain a healthy body weight or to lose weight. Ask what an ideal weight is for you.  Get at least 30 minutes of exercise that causes your heart to beat faster (aerobic exercise) most days of the week. Activities may include walking, swimming, or biking.  Include exercise to strengthen your muscles (resistance exercise), such as pilates or lifting weights, as part of your weekly exercise routine. Try to do these types of exercises for 30 minutes at least 3 days a week.  Do not use any products that contain nicotine or tobacco, such as cigarettes and e-cigarettes. If you need help quitting, ask your health care provider.  Monitor your blood pressure at home as told by your health care provider.  Keep all follow-up visits as told by your health care provider. This is important. Medicines  Take over-the-counter and prescription medicines only as told by your health care provider. Follow directions carefully. Blood pressure medicines must be taken as prescribed.  Do not skip doses of blood pressure medicine. Doing this puts you at risk  for problems and can make the medicine less effective.  Ask your health care provider about side effects or reactions to medicines that you should watch for. Contact a health care provider if:  You think you are having a reaction to a medicine you are taking.  You have headaches that keep coming back (recurring).  You feel dizzy.  You have swelling in your ankles.  You have trouble with your vision. Get help right away if:  You develop a severe headache or confusion.  You have unusual weakness or numbness.  You feel faint.  You have severe pain in your chest or abdomen.  You vomit repeatedly.  You have trouble breathing. Summary  Hypertension is when the force of blood pumping through your arteries is too strong. If this condition is not controlled, it may put you at risk for serious complications.  Your personal target blood pressure may vary depending on your medical conditions, your age, and other factors. For most people, a normal blood pressure is less than 120/80.  Hypertension is treated with lifestyle changes, medicines, or a combination of both. Lifestyle changes include weight loss, eating a healthy, low-sodium diet, exercising  more, and limiting alcohol. This information is not intended to replace advice given to you by your health care provider. Make sure you discuss any questions you have with your health care provider. Document Released: 06/22/2005 Document Revised: 05/20/2016 Document Reviewed: 05/20/2016 Elsevier Interactive Patient Education  2019 [employer]      Agustina Caroli, MD [facility]

## 2018-09-08 NOTE — Patient Instructions (Addendum)
[facility] phone number is: [phone], patient was referred for colonoscopy.    If you have lab work done today you will be contacted with your lab results within the next 2 weeks.  If you have not heard from [place] then please contact us. The fastest way to get your results is to register for My Chart.   IF you received an x-ray today, you will receive an invoice from [facility]. Please contact [facility] at [phone] with questions or concerns regarding your invoice.   IF you received labwork today, you will receive an invoice from [facility]. Please contact [facility] at [phone] with questions or concerns regarding your invoice.   Our billing staff will not be able to assist you with questions regarding bills from these companies.  You will be contacted with the lab results as soon as they are available. The fastest way to get your results is to activate your My Chart account. Instructions are located on the last page of this paperwork. If you have not heard from [place] regarding the results in 2 weeks, please contact this office.     Hypertension Hypertension, commonly called high blood pressure, is when the force of blood pumping through the arteries is too strong. The arteries are the blood vessels that carry blood from the heart throughout the body. Hypertension forces the heart to work harder to pump blood and may cause arteries to become narrow or stiff. Having untreated or uncontrolled hypertension can cause heart attacks, strokes, kidney disease, and other problems. A blood pressure reading consists of a higher number over a lower number. Ideally, your blood pressure should be below 120/80. The first ("top") number is called the systolic pressure. It is a measure of the pressure in your arteries as your heart beats. The second ("bottom") number is called the diastolic pressure. It is a measure of the pressure in your arteries as the heart relaxes. What are the  causes? The cause of this condition is not known. What increases the risk? Some risk factors for high blood pressure are under your control. Others are not. Factors you can change  Smoking.  Having type 2 diabetes mellitus, high cholesterol, or both.  Not getting enough exercise or physical activity.  Being overweight.  Having too much fat, sugar, calories, or salt (sodium) in your diet.  Drinking too much alcohol. Factors that are difficult or impossible to change  Having chronic kidney disease.  Having a family history of high blood pressure.  Age. Risk increases with age.  Race. You may be at higher risk if you are African-American.  Gender. Men are at higher risk than women before age 23. After age 15, women are at higher risk than men.  Having obstructive sleep apnea.  Stress. What are the signs or symptoms? Extremely high blood pressure (hypertensive crisis) may cause:  Headache.  Anxiety.  Shortness of breath.  Nosebleed.  Nausea and vomiting.  Severe chest pain.  Jerky movements you cannot control (seizures). How is this diagnosed? This condition is diagnosed by measuring your blood pressure while you are seated, with your arm resting on a surface. The cuff of the blood pressure monitor will be placed directly against the skin of your upper arm at the level of your heart. It should be measured at least twice using the same arm. Certain conditions can cause a difference in blood pressure between your right and left arms. Certain factors can cause blood pressure readings to be lower or higher than  normal (elevated) for a short period of time:  When your blood pressure is higher when you are in a health care provider's office than when you are at home, this is called white coat hypertension. Most people with this condition do not need medicines.  When your blood pressure is higher at home than when you are in a health care provider's office, this is called  masked hypertension. Most people with this condition may need medicines to control blood pressure. If you have a high blood pressure reading during one visit or you have normal blood pressure with other risk factors:  You may be asked to return on a different day to have your blood pressure checked again.  You may be asked to monitor your blood pressure at home for 1 week or longer. If you are diagnosed with hypertension, you may have other blood or imaging tests to help your health care provider understand your overall risk for other conditions. How is this treated? This condition is treated by making healthy lifestyle changes, such as eating healthy foods, exercising more, and reducing your alcohol intake. Your health care provider may prescribe medicine if lifestyle changes are not enough to get your blood pressure under control, and if:  Your systolic blood pressure is above 130.  Your diastolic blood pressure is above 80. Your personal target blood pressure may vary depending on your medical conditions, your age, and other factors. Follow these instructions at home: Eating and drinking   Eat a diet that is high in fiber and potassium, and low in sodium, added sugar, and fat. An example eating plan is called the DASH (Dietary Approaches to Stop Hypertension) diet. To eat this way: ? Eat plenty of fresh fruits and vegetables. Try to fill half of your plate at each meal with fruits and vegetables. ? Eat whole grains, such as whole wheat pasta, brown rice, or whole grain bread. Fill about one quarter of your plate with whole grains. ? Eat or drink low-fat dairy products, such as skim milk or low-fat yogurt. ? Avoid fatty cuts of meat, processed or cured meats, and poultry with skin. Fill about one quarter of your plate with lean proteins, such as fish, chicken without skin, beans, eggs, and tofu. ? Avoid premade and processed foods. These tend to be higher in sodium, added sugar, and  fat.  Reduce your daily sodium intake. Most people with hypertension should eat less than 1,500 mg of sodium a day.  Limit alcohol intake to no more than 1 drink a day for nonpregnant women and 2 drinks a day for men. One drink equals 12 oz of beer, 5 oz of wine, or 1 oz of hard liquor. Lifestyle   Work with your health care provider to maintain a healthy body weight or to lose weight. Ask what an ideal weight is for you.  Get at least 30 minutes of exercise that causes your heart to beat faster (aerobic exercise) most days of the week. Activities may include walking, swimming, or biking.  Include exercise to strengthen your muscles (resistance exercise), such as pilates or lifting weights, as part of your weekly exercise routine. Try to do these types of exercises for 30 minutes at least 3 days a week.  Do not use any products that contain nicotine or tobacco, such as cigarettes and e-cigarettes. If you need help quitting, ask your health care provider.  Monitor your blood pressure at home as told by your health care provider.  Keep all  follow-up visits as told by your health care provider. This is important. Medicines  Take over-the-counter and prescription medicines only as told by your health care provider. Follow directions carefully. Blood pressure medicines must be taken as prescribed.  Do not skip doses of blood pressure medicine. Doing this puts you at risk for problems and can make the medicine less effective.  Ask your health care provider about side effects or reactions to medicines that you should watch for. Contact a health care provider if:  You think you are having a reaction to a medicine you are taking.  You have headaches that keep coming back (recurring).  You feel dizzy.  You have swelling in your ankles.  You have trouble with your vision. Get help right away if:  You develop a severe headache or confusion.  You have unusual weakness or numbness.  You  feel faint.  You have severe pain in your chest or abdomen.  You vomit repeatedly.  You have trouble breathing. Summary  Hypertension is when the force of blood pumping through your arteries is too strong. If this condition is not controlled, it may put you at risk for serious complications.  Your personal target blood pressure may vary depending on your medical conditions, your age, and other factors. For most people, a normal blood pressure is less than 120/80.  Hypertension is treated with lifestyle changes, medicines, or a combination of both. Lifestyle changes include weight loss, eating a healthy, low-sodium diet, exercising more, and limiting alcohol. This information is not intended to replace advice given to you by your health care provider. Make sure you discuss any questions you have with your health care provider. Document Released: 06/22/2005 Document Revised: 05/20/2016 Document Reviewed: 05/20/2016 Elsevier Interactive Patient Education  2019 [employer].

## 2018-09-09 ENCOUNTER — Encounter: Payer: Self-pay | Admitting: Radiology

## 2018-09-09 LAB — LIPID PANEL
CHOLESTEROL TOTAL: 152 mg/dL (ref 100–199)
Chol/HDL Ratio: 2.6 ratio (ref 0.0–5.0)
HDL: 59 mg/dL (ref >39)
LDL Calculated: 80 mg/dL (ref 0–99)
Triglycerides: 66 mg/dL (ref 0–149)
VLDL CHOLESTEROL CAL: 13 mg/dL (ref 5–40)

## 2018-09-09 LAB — CBC WITH DIFFERENTIAL/PLATELET
BASOS ABS: 0 10*3/uL (ref 0.0–0.2)
Basos: 1 %
EOS (ABSOLUTE): 0.2 10*3/uL (ref 0.0–0.4)
Eos: 6 %
Hematocrit: 31.1 % — ABNORMAL LOW (ref 37.5–51.0)
Hemoglobin: 9.9 g/dL — ABNORMAL LOW (ref 13.0–17.7)
IMMATURE GRANULOCYTES: 0 %
Immature Grans (Abs): 0 10*3/uL (ref 0.0–0.1)
LYMPHS: 25 %
Lymphocytes Absolute: 0.9 10*3/uL (ref 0.7–3.1)
MCH: 27.7 pg (ref 26.6–33.0)
MCHC: 31.8 g/dL (ref 31.5–35.7)
MCV: 87 fL (ref 79–97)
Monocytes Absolute: 0.6 10*3/uL (ref 0.1–0.9)
Monocytes: 16 %
Neutrophils Absolute: 1.9 10*3/uL (ref 1.4–7.0)
Neutrophils: 52 %
Platelets: 177 10*3/uL (ref 150–450)
RBC: 3.57 x10E6/uL — ABNORMAL LOW (ref 4.14–5.80)
RDW: 16.2 % — ABNORMAL HIGH (ref 11.6–15.4)
WBC: 3.7 10*3/uL (ref 3.4–10.8)

## 2018-09-09 LAB — COMPREHENSIVE METABOLIC PANEL
ALBUMIN: 3.4 g/dL — AB (ref 3.7–4.7)
ALT: 21 IU/L (ref 0–44)
AST: 19 IU/L (ref 0–40)
Albumin/Globulin Ratio: 0.9 — ABNORMAL LOW (ref 1.2–2.2)
Alkaline Phosphatase: 88 IU/L (ref 39–117)
BUN/Creatinine Ratio: 10 (ref 10–24)
BUN: 10 mg/dL (ref 8–27)
Bilirubin Total: 0.5 mg/dL (ref 0.0–1.2)
CO2: 16 mmol/L — AB (ref 20–29)
CREATININE: 1.01 mg/dL (ref 0.76–1.27)
Calcium: 8.9 mg/dL (ref 8.6–10.2)
Chloride: 107 mmol/L — ABNORMAL HIGH (ref 96–106)
GFR, EST AFRICAN AMERICAN: 85 mL/min/{1.73_m2} (ref >59)
GFR, EST NON AFRICAN AMERICAN: 73 mL/min/{1.73_m2} (ref >59)
GLUCOSE: 84 mg/dL (ref 65–99)
Globulin, Total: 3.9 g/dL (ref 1.5–4.5)
Potassium: 4.7 mmol/L (ref 3.5–5.2)
Sodium: 142 mmol/L (ref 134–144)
TOTAL PROTEIN: 7.3 g/dL (ref 6.0–8.5)

## 2018-09-09 LAB — TSH: TSH: 1.22 u[IU]/mL (ref 0.450–4.500)

## 2018-10-11 ENCOUNTER — Other Ambulatory Visit: Payer: Self-pay | Admitting: Internal Medicine

## 2018-10-11 ENCOUNTER — Other Ambulatory Visit (HOSPITAL_COMMUNITY): Payer: Self-pay | Admitting: Cardiology

## 2018-10-17 ENCOUNTER — Other Ambulatory Visit: Payer: Medicare HMO

## 2018-10-28 ENCOUNTER — Other Ambulatory Visit: Payer: Self-pay | Admitting: Family Medicine

## 2018-10-28 ENCOUNTER — Other Ambulatory Visit: Payer: Self-pay | Admitting: Internal Medicine

## 2018-10-28 DIAGNOSIS — R399 Unspecified symptoms and signs involving the genitourinary system: Secondary | ICD-10-CM

## 2018-10-28 NOTE — Telephone Encounter (Signed)
Requested Prescriptions  Pending Prescriptions Disp Refills  . finasteride (PROSCAR) 5 MG tablet [Pharmacy Med Name: FINASTERIDE 5MG  TABLETS] 90 tablet 0    Sig: TAKE 1 TABLET(5 MG) BY MOUTH DAILY     Urology: 5-alpha Reductase Inhibitors Passed - 10/28/2018  9:53 AM      Passed - Valid encounter within last 12 months    Recent Outpatient Visits          1 month ago Essential hypertension   Primary Care at [facility], [place], [place]   4 months ago Hyperlipidemia, unspecified hyperlipidemia type   Primary Care at [facility], Ranell Patrick, [place]   1 year ago Dysuria   Primary Care at [facility], [place], [place]   1 year ago Bilateral lower extremity edema   Primary Care at [facility], [place], [place]   1 year ago Bilateral lower extremity edema   Primary Care at [facility], [place], [place]      Future Appointments            In 4 months Sagardia, Ines Bloomer, MD Primary Care at [place], [facility]

## 2018-12-10 ENCOUNTER — Other Ambulatory Visit: Payer: Self-pay | Admitting: Family Medicine

## 2018-12-10 DIAGNOSIS — E785 Hyperlipidemia, unspecified: Secondary | ICD-10-CM

## 2018-12-10 NOTE — Telephone Encounter (Signed)
Requested Prescriptions  Pending Prescriptions Disp Refills  . atorvastatin (LIPITOR) 40 MG tablet [Pharmacy Med Name: ATORVASTATIN 40MG  TABLETS] 90 tablet 1    Sig: TAKE 1 TABLET BY MOUTH DAILY     Cardiovascular:  Antilipid - Statins Passed - 12/10/2018  6:35 AM      Passed - Total Cholesterol in normal range and within 360 days    Cholesterol, Total  Date Value Ref Range Status  09/08/2018 152 100 - 199 mg/dL Final         Passed - LDL in normal range and within 360 days    LDL Calculated  Date Value Ref Range Status  09/08/2018 80 0 - 99 mg/dL Final         Passed - HDL in normal range and within 360 days    HDL  Date Value Ref Range Status  09/08/2018 59 >39 mg/dL Final         Passed - Triglycerides in normal range and within 360 days    Triglycerides  Date Value Ref Range Status  09/08/2018 66 0 - 149 mg/dL Final         Passed - Patient is not pregnant      Passed - Valid encounter within last 12 months    Recent Outpatient Visits          3 months ago Essential hypertension   Primary Care at [facility], [place], [place]   6 months ago Hyperlipidemia, unspecified hyperlipidemia type   Primary Care at [facility], Ranell Patrick, [place]   1 year ago Dysuria   Primary Care at [facility], [place], [place]   1 year ago Bilateral lower extremity edema   Primary Care at [facility], [place], [place]   1 year ago Bilateral lower extremity edema   Primary Care at [facility], [place], [place]      Future Appointments            In 3 months Sagardia, Ines Bloomer, MD Primary Care at [place], [facility]

## 2019-01-09 ENCOUNTER — Other Ambulatory Visit: Payer: Self-pay | Admitting: Emergency Medicine

## 2019-01-09 DIAGNOSIS — R399 Unspecified symptoms and signs involving the genitourinary system: Secondary | ICD-10-CM

## 2019-01-09 NOTE — Telephone Encounter (Signed)
Requested Prescriptions  Pending Prescriptions Disp Refills  . finasteride (PROSCAR) 5 MG tablet [Pharmacy Med Name: FINASTERIDE 5MG  TABLETS] 90 tablet 0    Sig: TAKE 1 TABLET(5 MG) BY MOUTH DAILY     Urology: 5-alpha Reductase Inhibitors Passed - 01/09/2019  7:18 AM      Passed - Valid encounter within last 12 months    Recent Outpatient Visits          4 months ago Essential hypertension   Primary Care at [facility], [employer], [place]   7 months ago Hyperlipidemia, unspecified hyperlipidemia type   Primary Care at [facility], Ranell Patrick, [place]   1 year ago Dysuria   Primary Care at [facility], [place], [place]   1 year ago Bilateral lower extremity edema   Primary Care at [facility], [place], [place]   1 year ago Bilateral lower extremity edema   Primary Care at [facility], [place], [place]      Future Appointments            In 2 months Sagardia, Ines Bloomer, MD Primary Care at [place], [facility]

## 2019-01-19 ENCOUNTER — Other Ambulatory Visit: Payer: Self-pay | Admitting: Internal Medicine

## 2019-02-08 ENCOUNTER — Other Ambulatory Visit (HOSPITAL_COMMUNITY): Payer: Self-pay | Admitting: Cardiology

## 2019-02-08 ENCOUNTER — Other Ambulatory Visit: Payer: Self-pay | Admitting: Internal Medicine

## 2019-03-08 ENCOUNTER — Telehealth: Payer: Self-pay | Admitting: Emergency Medicine

## 2019-03-08 NOTE — Telephone Encounter (Signed)
Called pt LVM to change his appt for 03/09/2019 to virtual per Dr. Waynard Edwards

## 2019-03-09 ENCOUNTER — Ambulatory Visit (INDEPENDENT_AMBULATORY_CARE_PROVIDER_SITE_OTHER): Payer: Medicare HMO | Admitting: Emergency Medicine

## 2019-03-09 ENCOUNTER — Encounter: Payer: Self-pay | Admitting: Emergency Medicine

## 2019-03-09 ENCOUNTER — Other Ambulatory Visit: Payer: Self-pay

## 2019-03-09 VITALS — BP 149/73 | HR 57 | Temp 98.7°F | Resp 16 | Ht 67.0 in | Wt 163.6 lb

## 2019-03-09 DIAGNOSIS — E785 Hyperlipidemia, unspecified: Secondary | ICD-10-CM | POA: Diagnosis not present

## 2019-03-09 DIAGNOSIS — E859 Amyloidosis, unspecified: Secondary | ICD-10-CM

## 2019-03-09 DIAGNOSIS — I10 Essential (primary) hypertension: Secondary | ICD-10-CM

## 2019-03-09 DIAGNOSIS — I420 Dilated cardiomyopathy: Secondary | ICD-10-CM | POA: Diagnosis not present

## 2019-03-09 DIAGNOSIS — I509 Heart failure, unspecified: Secondary | ICD-10-CM | POA: Diagnosis not present

## 2019-03-09 DIAGNOSIS — E0590 Thyrotoxicosis, unspecified without thyrotoxic crisis or storm: Secondary | ICD-10-CM

## 2019-03-09 DIAGNOSIS — Z7901 Long term (current) use of anticoagulants: Secondary | ICD-10-CM | POA: Diagnosis not present

## 2019-03-09 DIAGNOSIS — I110 Hypertensive heart disease with heart failure: Secondary | ICD-10-CM | POA: Diagnosis not present

## 2019-03-09 NOTE — Progress Notes (Signed)
BP Readings from Last 3 Encounters:  03/09/19 (!) 149/73  09/08/18 (!) 157/76  06/10/18 140/76   Starleen Arms 73 y.o.   Chief Complaint  Patient presents with   Hypertension    6 MONTH FOLLOW UP    HISTORY OF PRESENT ILLNESS: This is a 73 y.o. male with history of hypertension here for follow-up.  Has no complaints or medical concerns.  Compliant with medication.  Here today with his wife.  She has no complaints.  He is eating well and sleeping well. 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  Denies rectal bleeding or melena. Also has a history of congestive heart failure and dyslipidemia.  On beta-blocker and statin.  Also on Xarelto. Also has a history of thyroid disease. No medical concerns identified during this visit. HPI   Prior to Admission medications   Medication Sig Start Date End Date Taking? Authorizing Provider  atorvastatin (LIPITOR) 40 MG tablet TAKE 1 TABLET BY MOUTH DAILY 12/10/18  Yes Tarshia Kot, Ines Bloomer, MD  carvedilol (COREG) 12.5 MG tablet TAKE 1 AND 1/2 TABLET BY MOUTH TWICE DAILY WITH MEALS 02/09/19  Yes Larey Dresser, MD  finasteride (PROSCAR) 5 MG tablet TAKE 1 TABLET(5 MG) BY MOUTH DAILY 01/09/19  Yes Yuniel Blaney, Ines Bloomer, MD  isosorbide mononitrate (IMDUR) 60 MG 24 hr tablet Take 1 tablet (60 mg total) daily by mouth. 05/13/17 04/16/19 Yes Larey Dresser, MD  methimazole (TAPAZOLE) 5 MG tablet TAKE 1 TABLET BY MOUTH EVERY DAY 01/19/19  Yes Philemon Kingdom, MD  nitroGLYCERIN (NITROSTAT) 0.4 MG SL tablet Place 1 tablet (0.4 mg total) under the tongue every 5 (five) minutes x 3 doses as needed for chest pain. 09/08/18  Yes Jayquan Bradsher, Ines Bloomer, MD  rivaroxaban (XARELTO) 20 MG TABS tablet Take 1 tablet (20 mg total) by mouth daily with supper. Please schedule an appt for further refills 02/08/19  Yes Larey Dresser, MD  furosemide (LASIX) 20 MG tablet Take 1 tablet (20 mg total) by mouth every other day. Patient not taking: Reported on 03/09/2019  04/27/17 04/16/19  Arbutus Leas, NP  hydrALAZINE (APRESOLINE) 25 MG tablet Take 1.5 tablets (37.5 mg total) 3 (three) times daily by mouth. 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   Acute systolic CHF (congestive heart failure) ([place]) 09/05/2014   Tobacco abuse 09/04/2014   Dyslipidemia 09/04/2014   Lower urinary tract symptoms     Past Medical History:  Diagnosis Date   CHF (congestive heart failure) (HCC)    Chronic systolic dysfunction of left ventricle    Nonischemic cardiomyopathy (HCC)    NSVT (nonsustained ventricular tachycardia) (HCC)    Urinary incontinence     Past Surgical History:  Procedure Laterality Date   ANKLE FRACTURE SURGERY     KNEE SURGERY     LEFT HEART CATHETERIZATION WITH CORONARY ANGIOGRAM N/A 09/04/2014   Procedure: LEFT HEART CATHETERIZATION WITH CORONARY ANGIOGRAM;  Surgeon: Sanda Klein, MD;  Location: [facility];  Service: Cardiovascular;  Laterality: N/A;    Social History   Socioeconomic History   Marital status: Married    Spouse name:  Visua   Number of children: 2   Years of education: 10th grade   Highest education level: Not on file  Occupational History   Occupation: retired     Comment: Chief Technology Officer, mill work, [employer], hospital strain: Not on file   Food insecurity    Worry: Not on file    Inability: Not on Lexicographer needs    Medical: Not on file    Non-medical: Not on file  Tobacco Use   Smoking status: Heavy Tobacco Smoker    Packs/day: 1.00     Years: 52.00    Pack years: 52.00   Smokeless tobacco: Never Used  Substance and Sexual Activity   Alcohol use: No    Alcohol/week: 0.0 standard drinks    Comment: remote heavy ETOH, none for years   Drug use: No   Sexual activity: Not on file  Lifestyle   Physical activity    Days per week: Not on file    Minutes per session: Not on file   Stress: Not on file  Relationships   Social connections    Talks on phone: Not on file    Gets together: Not on file    Attends religious service: Not on file    Active member of club or organization: Not on file    Attends meetings of clubs or organizations: Not on file    Relationship status: Not on file   Intimate partner violence    Fear of current or ex partner: Not on file    Emotionally abused: Not on file    Physically abused: Not on file    Forced sexual activity: Not on file  Other Topics Concern   Not on file  Social History Narrative   Pt lives in [place] with spouse.  Previously worked at [employer].  Retired.  2 grown daughters are healthy.    Family History  Problem Relation Age of Onset   Heart disease Unknown    Stroke Unknown    Cancer Sister        breast cancer     Review of Systems  Constitutional: Negative.  Negative for chills and fever.  HENT: Negative for congestion and sore throat.   Respiratory: Negative.  Negative for cough and shortness of breath.   Cardiovascular: Negative for chest pain, palpitations and leg swelling.  Gastrointestinal: Negative.  Negative for abdominal pain, blood in stool, diarrhea, heartburn, melena, nausea and vomiting.  Musculoskeletal: Negative.  Negative for back pain, myalgias and neck pain.  Skin: Negative.  Negative for rash.  Neurological: Negative for dizziness and headaches.  Endo/Heme/Allergies: Negative.   All other systems reviewed and are negative.   Today's Vitals   03/09/19 0940  BP: (!) 149/73  Pulse: (!) 57  Resp: 16    Temp: 98.7 F (37.1 C)  TempSrc: Oral  SpO2: 100%  Weight: 163 lb 9.6 oz (74.2 kg)  Height: 5\' 7"  (1.702 m)   Body mass index is 25.62 kg/m.  Physical Exam Vitals signs reviewed.  Constitutional:      Appearance: Normal appearance.  HENT:     Head: Normocephalic and atraumatic.  Eyes:     Extraocular Movements: Extraocular movements intact.     Conjunctiva/sclera: Conjunctivae normal.     Pupils: Pupils are equal, round, and reactive to light.  Neck:     Musculoskeletal: Normal range of motion and neck supple.  Cardiovascular:     Rate  and Rhythm: Regular rhythm. Bradycardia present.     Heart sounds: Normal heart sounds.     Comments: Takes beta-blocker. Pulmonary:     Effort: Pulmonary effort is normal.     Breath sounds: Normal breath sounds.  Abdominal:     Palpations: Abdomen is soft.     Tenderness: There is no abdominal tenderness.  Musculoskeletal: Normal range of motion.        General: No tenderness.     Right lower leg: No edema.     Left lower leg: No edema.  Skin:    General: Skin is warm and dry.     Capillary Refill: Capillary refill takes less than 2 seconds.  Neurological:     General: No focal deficit present.     Mental Status: He is alert and oriented to person, place, and time.  Psychiatric:        Mood and Affect: Mood normal.        Behavior: Behavior normal.      ASSESSMENT & PLAN: Nick[place] was seen today for hypertension.  Diagnoses and all orders for this visit:  Essential hypertension -     CBC with Differential/Platelet -     Comprehensive metabolic panel  Amyloidosis, unspecified type ([place])  Nonischemic dilated cardiomyopathy (HCC)  Hyperlipidemia, unspecified hyperlipidemia type  Chronic anticoagulation  Chronic congestive heart failure, unspecified heart failure type ([place])  Subclinical hyperthyroidism   Clinically stable.  No medical concerns identified during this visit.  Continue present medications.  No  changes.  Follow-up in 6 months.   Patient Instructions       If you have lab work done today you will be contacted with your lab results within the next 2 weeks.  If you have not heard from [place] then please contact us. The fastest way to get your results is to register for My Chart.   IF you received an x-ray today, you will receive an invoice from [facility]. Please contact [facility] at [phone] with questions or concerns regarding your invoice.   IF you received labwork today, you will receive an invoice from [facility]. Please contact [facility] at [phone] with questions or concerns regarding your invoice.   Our billing staff will not be able to assist you with questions regarding bills from these companies.  You will be contacted with the lab results as soon as they are available. The fastest way to get your results is to activate your My Chart account. Instructions are located on the last page of this paperwork. If you have not heard from [place] regarding the results in 2 weeks, please contact this office.     Hypertension, Adult High blood pressure (hypertension) is when the force of blood pumping through the arteries is too strong. The arteries are the blood vessels that carry blood from the heart throughout the body. Hypertension forces the heart to work harder to pump blood and may cause arteries to become narrow or stiff. Untreated or uncontrolled hypertension can cause a heart attack, heart failure, a stroke, kidney disease, and other problems. A blood pressure reading consists of a higher number over a lower number. Ideally, your blood pressure should be below 120/80. The first ("top") number is called the systolic pressure. It is a measure of the pressure in your arteries as your heart beats. The second ("bottom") number is called the diastolic pressure. It is a measure of the pressure in your arteries as the heart relaxes. What are the causes? The exact  cause of this condition is not known. There are some conditions that result in or are related to high blood pressure. What increases the risk? Some risk factors for high blood pressure are under your control. The following factors may make you more likely to develop this condition:  Smoking.  Having type 2 diabetes mellitus, high cholesterol, or both.  Not getting enough exercise or physical activity.  Being overweight.  Having too much fat, sugar, calories, or salt (sodium) in your diet.  Drinking too much alcohol. Some risk factors for high blood pressure may be difficult or impossible to change. Some of these factors include:  Having chronic kidney disease.  Having a family history of high blood pressure.  Age. Risk increases with age.  Race. You may be at higher risk if you are African American.  Gender. Men are at higher risk than women before age 59. After age 61, women are at higher risk than men.  Having obstructive sleep apnea.  Stress. What are the signs or symptoms? High blood pressure may not cause symptoms. Very high blood pressure (hypertensive crisis) may cause:  Headache.  Anxiety.  Shortness of breath.  Nosebleed.  Nausea and vomiting.  Vision changes.  Severe chest pain.  Seizures. How is this diagnosed? This condition is diagnosed by measuring your blood pressure while you are seated, with your arm resting on a flat surface, your legs uncrossed, and your feet flat on the floor. The cuff of the blood pressure monitor will be placed directly against the skin of your upper arm at the level of your heart. It should be measured at least twice using the same arm. Certain conditions can cause a difference in blood pressure between your right and left arms. Certain factors can cause blood pressure readings to be lower or higher than normal for a short period of time:  When your blood pressure is higher when you are in a health care provider's office than  when you are at home, this is called white coat hypertension. Most people with this condition do not need medicines.  When your blood pressure is higher at home than when you are in a health care provider's office, this is called masked hypertension. Most people with this condition may need medicines to control blood pressure. If you have a high blood pressure reading during one visit or you have normal blood pressure with other risk factors, you may be asked to:  Return on a different day to have your blood pressure checked again.  Monitor your blood pressure at home for 1 week or longer. If you are diagnosed with hypertension, you may have other blood or imaging tests to help your health care provider understand your overall risk for other conditions. How is this treated? This condition is treated by making healthy lifestyle changes, such as eating healthy foods, exercising more, and reducing your alcohol intake. Your health care provider may prescribe medicine if lifestyle changes are not enough to get your blood pressure under control, and if:  Your systolic blood pressure is above 130.  Your diastolic blood pressure is above 80. Your personal target blood pressure may vary depending on your medical conditions, your age, and other factors. Follow these instructions at home: Eating and drinking   Eat a diet that is high in fiber and potassium, and low in sodium, added sugar, and fat. An example eating plan is called the DASH (Dietary Approaches to Stop Hypertension) diet. To eat this way: ? Eat plenty  of fresh fruits and vegetables. Try to fill one half of your plate at each meal with fruits and vegetables. ? Eat whole grains, such as whole-wheat pasta, brown rice, or whole-grain bread. Fill about one fourth of your plate with whole grains. ? Eat or drink low-fat dairy products, such as skim milk or low-fat yogurt. ? Avoid fatty cuts of meat, processed or cured meats, and poultry with  skin. Fill about one fourth of your plate with lean proteins, such as fish, chicken without skin, beans, eggs, or tofu. ? Avoid pre-made and processed foods. These tend to be higher in sodium, added sugar, and fat.  Reduce your daily sodium intake. Most people with hypertension should eat less than 1,500 mg of sodium a day.  Do not drink alcohol if: ? Your health care provider tells you not to drink. ? You are pregnant, may be pregnant, or are planning to become pregnant.  If you drink alcohol: ? Limit how much you use to:  0-1 drink a day for women.  0-2 drinks a day for men. ? Be aware of how much alcohol is in your drink. In the [place] one drink equals one 12 oz bottle of beer (355 mL), one 5 oz glass of wine (148 mL), or one 1 oz glass of hard liquor (44 mL). Lifestyle   Work with your health care provider to maintain a healthy body weight or to lose weight. Ask what an ideal weight is for you.  Get at least 30 minutes of exercise most days of the week. Activities may include walking, swimming, or biking.  Include exercise to strengthen your muscles (resistance exercise), such as Pilates or lifting weights, as part of your weekly exercise routine. Try to do these types of exercises for 30 minutes at least 3 days a week.  Do not use any products that contain nicotine or tobacco, such as cigarettes, e-cigarettes, and chewing tobacco. If you need help quitting, ask your health care provider.  Monitor your blood pressure at home as told by your health care provider.  Keep all follow-up visits as told by your health care provider. This is important. Medicines  Take over-the-counter and prescription medicines only as told by your health care provider. Follow directions carefully. Blood pressure medicines must be taken as prescribed.  Do not skip doses of blood pressure medicine. Doing this puts you at risk for problems and can make the medicine less effective.  Ask your health care  provider about side effects or reactions to medicines that you should watch for. Contact a health care provider if you:  Think you are having a reaction to a medicine you are taking.  Have headaches that keep coming back (recurring).  Feel dizzy.  Have swelling in your ankles.  Have trouble with your vision. Get help right away if you:  Develop a severe headache or confusion.  Have unusual weakness or numbness.  Feel faint.  Have severe pain in your chest or abdomen.  Vomit repeatedly.  Have trouble breathing. Summary  Hypertension is when the force of blood pumping through your arteries is too strong. If this condition is not controlled, it may put you at risk for serious complications.  Your personal target blood pressure may vary depending on your medical conditions, your age, and other factors. For most people, a normal blood pressure is less than 120/80.  Hypertension is treated with lifestyle changes, medicines, or a combination of both. Lifestyle changes include losing weight, eating a healthy,  low-sodium diet, exercising more, and limiting alcohol. This information is not intended to replace advice given to you by your health care provider. Make sure you discuss any questions you have with your health care provider. Document Released: 06/22/2005 Document Revised: 03/02/2018 Document Reviewed: 03/02/2018 Elsevier Patient Education  2020 [employer]      Agustina Caroli, MD [facility]

## 2019-03-09 NOTE — Patient Instructions (Addendum)
   If you have lab work done today you will be contacted with your lab results within the next 2 weeks.  If you have not heard from us then please contact us. The fastest way to get your results is to register for My Chart.   IF you received an x-ray today, you will receive an invoice from [facility]. Please contact [facility] at [phone] with questions or concerns regarding your invoice.   IF you received labwork today, you will receive an invoice from [facility]. Please contact [facility] at [phone] with questions or concerns regarding your invoice.   Our billing staff will not be able to assist you with questions regarding bills from these companies.  You will be contacted with the lab results as soon as they are available. The fastest way to get your results is to activate your My Chart account. Instructions are located on the last page of this paperwork. If you have not heard from us regarding the results in 2 weeks, please contact this office.     Hypertension, Adult High blood pressure (hypertension) is when the force of blood pumping through the arteries is too strong. The arteries are the blood vessels that carry blood from the heart throughout the body. Hypertension forces the heart to work harder to pump blood and may cause arteries to become narrow or stiff. Untreated or uncontrolled hypertension can cause a heart attack, heart failure, a stroke, kidney disease, and other problems. A blood pressure reading consists of a higher number over a lower number. Ideally, your blood pressure should be below 120/80. The first ("top") number is called the systolic pressure. It is a measure of the pressure in your arteries as your heart beats. The second ("bottom") number is called the diastolic pressure. It is a measure of the pressure in your arteries as the heart relaxes. What are the causes? The exact cause of this condition is not known. There are some conditions  that result in or are related to high blood pressure. What increases the risk? Some risk factors for high blood pressure are under your control. The following factors may make you more likely to develop this condition:  Smoking.  Having type 2 diabetes mellitus, high cholesterol, or both.  Not getting enough exercise or physical activity.  Being overweight.  Having too much fat, sugar, calories, or salt (sodium) in your diet.  Drinking too much alcohol. Some risk factors for high blood pressure may be difficult or impossible to change. Some of these factors include:  Having chronic kidney disease.  Having a family history of high blood pressure.  Age. Risk increases with age.  Race. You may be at higher risk if you are African American.  Gender. Men are at higher risk than women before age 45. After age 65, women are at higher risk than men.  Having obstructive sleep apnea.  Stress. What are the signs or symptoms? High blood pressure may not cause symptoms. Very high blood pressure (hypertensive crisis) may cause:  Headache.  Anxiety.  Shortness of breath.  Nosebleed.  Nausea and vomiting.  Vision changes.  Severe chest pain.  Seizures. How is this diagnosed? This condition is diagnosed by measuring your blood pressure while you are seated, with your arm resting on a flat surface, your legs uncrossed, and your feet flat on the floor. The cuff of the blood pressure monitor will be placed directly against the skin of your upper arm at the level of your heart.   It should be measured at least twice using the same arm. Certain conditions can cause a difference in blood pressure between your right and left arms. Certain factors can cause blood pressure readings to be lower or higher than normal for a short period of time:  When your blood pressure is higher when you are in a health care provider's office than when you are at home, this is called white coat hypertension.  Most people with this condition do not need medicines.  When your blood pressure is higher at home than when you are in a health care provider's office, this is called masked hypertension. Most people with this condition may need medicines to control blood pressure. If you have a high blood pressure reading during one visit or you have normal blood pressure with other risk factors, you may be asked to:  Return on a different day to have your blood pressure checked again.  Monitor your blood pressure at home for 1 week or longer. If you are diagnosed with hypertension, you may have other blood or imaging tests to help your health care provider understand your overall risk for other conditions. How is this treated? This condition is treated by making healthy lifestyle changes, such as eating healthy foods, exercising more, and reducing your alcohol intake. Your health care provider may prescribe medicine if lifestyle changes are not enough to get your blood pressure under control, and if:  Your systolic blood pressure is above 130.  Your diastolic blood pressure is above 80. Your personal target blood pressure may vary depending on your medical conditions, your age, and other factors. Follow these instructions at home: Eating and drinking   Eat a diet that is high in fiber and potassium, and low in sodium, added sugar, and fat. An example eating plan is called the DASH (Dietary Approaches to Stop Hypertension) diet. To eat this way: ? Eat plenty of fresh fruits and vegetables. Try to fill one half of your plate at each meal with fruits and vegetables. ? Eat whole grains, such as whole-wheat pasta, brown rice, or whole-grain bread. Fill about one fourth of your plate with whole grains. ? Eat or drink low-fat dairy products, such as skim milk or low-fat yogurt. ? Avoid fatty cuts of meat, processed or cured meats, and poultry with skin. Fill about one fourth of your plate with lean proteins, such  as fish, chicken without skin, beans, eggs, or tofu. ? Avoid pre-made and processed foods. These tend to be higher in sodium, added sugar, and fat.  Reduce your daily sodium intake. Most people with hypertension should eat less than 1,500 mg of sodium a day.  Do not drink alcohol if: ? Your health care provider tells you not to drink. ? You are pregnant, may be pregnant, or are planning to become pregnant.  If you drink alcohol: ? Limit how much you use to:  0-1 drink a day for women.  0-2 drinks a day for men. ? Be aware of how much alcohol is in your drink. In the [place] one drink equals one 12 oz bottle of beer (355 mL), one 5 oz glass of wine (148 mL), or one 1 oz glass of hard liquor (44 mL). Lifestyle   Work with your health care provider to maintain a healthy body weight or to lose weight. Ask what an ideal weight is for you.  Get at least 30 minutes of exercise most days of the week. Activities may include walking, swimming, or   biking.  Include exercise to strengthen your muscles (resistance exercise), such as Pilates or lifting weights, as part of your weekly exercise routine. Try to do these types of exercises for 30 minutes at least 3 days a week.  Do not use any products that contain nicotine or tobacco, such as cigarettes, e-cigarettes, and chewing tobacco. If you need help quitting, ask your health care provider.  Monitor your blood pressure at home as told by your health care provider.  Keep all follow-up visits as told by your health care provider. This is important. Medicines  Take over-the-counter and prescription medicines only as told by your health care provider. Follow directions carefully. Blood pressure medicines must be taken as prescribed.  Do not skip doses of blood pressure medicine. Doing this puts you at risk for problems and can make the medicine less effective.  Ask your health care provider about side effects or reactions to medicines that you  should watch for. Contact a health care provider if you:  Think you are having a reaction to a medicine you are taking.  Have headaches that keep coming back (recurring).  Feel dizzy.  Have swelling in your ankles.  Have trouble with your vision. Get help right away if you:  Develop a severe headache or confusion.  Have unusual weakness or numbness.  Feel faint.  Have severe pain in your chest or abdomen.  Vomit repeatedly.  Have trouble breathing. Summary  Hypertension is when the force of blood pumping through your arteries is too strong. If this condition is not controlled, it may put you at risk for serious complications.  Your personal target blood pressure may vary depending on your medical conditions, your age, and other factors. For most people, a normal blood pressure is less than 120/80.  Hypertension is treated with lifestyle changes, medicines, or a combination of both. Lifestyle changes include losing weight, eating a healthy, low-sodium diet, exercising more, and limiting alcohol. This information is not intended to replace advice given to you by your health care provider. Make sure you discuss any questions you have with your health care provider. Document Released: 06/22/2005 Document Revised: 03/02/2018 Document Reviewed: 03/02/2018 Elsevier Patient Education  2020 [employer]  

## 2019-03-10 ENCOUNTER — Ambulatory Visit: Payer: Medicare HMO | Admitting: Emergency Medicine

## 2019-03-10 LAB — CBC WITH DIFFERENTIAL/PLATELET
Basophils Absolute: 0 10*3/uL (ref 0.0–0.2)
Basos: 1 %
EOS (ABSOLUTE): 0.5 10*3/uL — ABNORMAL HIGH (ref 0.0–0.4)
Eos: 11 %
Hematocrit: 33.1 % — ABNORMAL LOW (ref 37.5–51.0)
Hemoglobin: 10 g/dL — ABNORMAL LOW (ref 13.0–17.7)
Immature Grans (Abs): 0 10*3/uL (ref 0.0–0.1)
Immature Granulocytes: 0 %
Lymphocytes Absolute: 1.3 10*3/uL (ref 0.7–3.1)
Lymphs: 28 %
MCH: 26.2 pg — ABNORMAL LOW (ref 26.6–33.0)
MCHC: 30.2 g/dL — ABNORMAL LOW (ref 31.5–35.7)
MCV: 87 fL (ref 79–97)
Monocytes Absolute: 0.6 10*3/uL (ref 0.1–0.9)
Monocytes: 12 %
Neutrophils Absolute: 2.3 10*3/uL (ref 1.4–7.0)
Neutrophils: 48 %
Platelets: 183 10*3/uL (ref 150–450)
RBC: 3.82 x10E6/uL — ABNORMAL LOW (ref 4.14–5.80)
RDW: 15.6 % — ABNORMAL HIGH (ref 11.6–15.4)
WBC: 4.8 10*3/uL (ref 3.4–10.8)

## 2019-03-10 LAB — COMPREHENSIVE METABOLIC PANEL
ALT: 9 IU/L (ref 0–44)
AST: 12 IU/L (ref 0–40)
Albumin/Globulin Ratio: 1 — ABNORMAL LOW (ref 1.2–2.2)
Albumin: 3.6 g/dL — ABNORMAL LOW (ref 3.7–4.7)
Alkaline Phosphatase: 90 IU/L (ref 39–117)
BUN/Creatinine Ratio: 10 (ref 10–24)
BUN: 10 mg/dL (ref 8–27)
Bilirubin Total: 0.4 mg/dL (ref 0.0–1.2)
CO2: 20 mmol/L (ref 20–29)
Calcium: 8.4 mg/dL — ABNORMAL LOW (ref 8.6–10.2)
Chloride: 110 mmol/L — ABNORMAL HIGH (ref 96–106)
Creatinine, Ser: 0.97 mg/dL (ref 0.76–1.27)
GFR calc Af Amer: 89 mL/min/{1.73_m2} (ref >59)
GFR calc non Af Amer: 77 mL/min/{1.73_m2} (ref >59)
Globulin, Total: 3.5 g/dL (ref 1.5–4.5)
Glucose: 94 mg/dL (ref 65–99)
Potassium: 3.9 mmol/L (ref 3.5–5.2)
Sodium: 142 mmol/L (ref 134–144)
Total Protein: 7.1 g/dL (ref 6.0–8.5)

## 2019-04-09 ENCOUNTER — Other Ambulatory Visit: Payer: Self-pay | Admitting: Internal Medicine

## 2019-04-09 ENCOUNTER — Other Ambulatory Visit: Payer: Self-pay | Admitting: Emergency Medicine

## 2019-04-09 DIAGNOSIS — R399 Unspecified symptoms and signs involving the genitourinary system: Secondary | ICD-10-CM

## 2019-04-17 ENCOUNTER — Ambulatory Visit (INDEPENDENT_AMBULATORY_CARE_PROVIDER_SITE_OTHER): Payer: Medicare HMO | Admitting: Internal Medicine

## 2019-04-17 ENCOUNTER — Other Ambulatory Visit: Payer: Self-pay

## 2019-04-17 ENCOUNTER — Encounter: Payer: Self-pay | Admitting: Internal Medicine

## 2019-04-17 VITALS — BP 128/70 | HR 76 | Ht 67.0 in | Wt 163.0 lb

## 2019-04-17 DIAGNOSIS — E0590 Thyrotoxicosis, unspecified without thyrotoxic crisis or storm: Secondary | ICD-10-CM

## 2019-04-17 DIAGNOSIS — E0520 Thyrotoxicosis with toxic multinodular goiter without thyrotoxic crisis or storm: Secondary | ICD-10-CM | POA: Diagnosis not present

## 2019-04-17 LAB — T3, FREE: T3, Free: 3.1 pg/mL (ref 2.3–4.2)

## 2019-04-17 LAB — T4, FREE: Free T4: 0.97 ng/dL (ref 0.60–1.60)

## 2019-04-17 LAB — TSH: TSH: 0.97 u[IU]/mL (ref 0.35–4.50)

## 2019-04-17 MED ORDER — METHIMAZOLE 5 MG PO TABS: 5.0000 mg | ORAL_TABLET | Freq: Every day | ORAL | 3 refills | Status: DC

## 2019-04-17 NOTE — Patient Instructions (Signed)
Please continue Methimazole 5 mg daily.  Please stop at the lab.  Please have thyroid labs in 6 months and come back for a visit in 1 year.

## 2019-04-17 NOTE — Progress Notes (Signed)
Patient ID: Bryan Lamb, male   DOB: 09/19/1945, 73 y.o.   MRN: [id number]   HPI  Bryan Lamb is a 73 y.o.-year-old male, initially referred by his cardiologist, Dr. 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 Last visit 1 year ago.   Reviewed and addended history: His low TSH was discovered during investigation for heart ds.  Pt has a AMI in 09/03/2014. 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 He is followed by Dr. Rayann Heman.  He had a 2D Echo on 12/06/2014:  - Moderately dilated LV with EF 25% (prev. 20% in 09/2014), diffuse hypokinesis. No LVthrombus. Normal RV size with mildly decreased systolic function. No significant valvular abnormalities.  He saw cardiology in 12/2014 and at that visit, he refused an ICD placement. He was advised to wear his external defibrillator (vest), but stopped using it shortly after. He is on Carvedilol.  No SOB, CP, palpitations, syncope.  Thyroid uptake and scan on 01/10/2015: 24 hour radio iodine uptake calculated at 9%, slightly below the normal range.  Imaging of the thyroid gland demonstrates poor tracer localization within the thyroid lobes, with a few small areas of tracer localization seen with additional areas of decreased tracer localization.  Findings suggest presence of an enlarged multinodular thyroid gland though localization is insufficient for adequate characterization.  Potentially the thyroid gland could extend into the superior Mediastinum.  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 Since he did not have hyperthyroid symptoms,  we decided to just follow his thyroid labs.  Based on the results of the uptake and scan, his goiter could extend into the superior mediastinum (no compression symptoms), I repeatedly suggested (including at today's visit) a thyroid ultrasound to better characterize his goiter.  He repeatedly  refused this.  We started the low-dose methimazole as he refused RAI treatment.  He continues on 5 mg daily.  He does not miss doses.  He has a pillbox.  Review TFTs-they are normal on 5 mg of methimazole: Lab Results  Component Value Date   TSH 1.220 09/08/2018   TSH 2.60 04/15/2018   TSH 1.72 04/15/2017   TSH 1.23 10/13/2016   TSH 0.81 04/09/2016   TSH 0.20 (L) 10/09/2015   TSH 0.27 (L) 04/08/2015   TSH 0.15 (L) 12/06/2014   TSH 0.187 (L) 10/08/2014   TSH 0.060 (L) 09/04/2014   FREET4 0.92 04/15/2018   FREET4 0.96 04/15/2017   FREET4 0.95 10/13/2016   FREET4 0.93 04/09/2016   FREET4 1.17 10/09/2015   FREET4 1.12 04/08/2015   FREET4 1.16 12/06/2014   FREET4 1.05 10/08/2014    Lab Results  Component Value Date   T3FREE 3.5 04/15/2018   T3FREE 3.0 04/15/2017   T3FREE 3.8 10/13/2016   T3FREE 2.8 04/09/2016   T3FREE 3.4 10/09/2015   T3FREE 3.2 04/08/2015   T3FREE 2.8 12/06/2014   T3FREE 2.8 10/08/2014   His TSI's were not elevated, Lab Results  Component Value Date   TSI 36 12/06/2014   Pt denies: - feeling nodules in neck - hoarseness - dysphagia - choking - SOB with lying down  Pt does have a FH of thyroid ds.: sister. No FH of thyroid cancer. No h/o radiation tx to head or neck.  No seaweed or kelp. No recent contrast studies. No herbal supplements. No Biotin use. No recent steroids use.   Stopped Bydil. Stopped Lipitor. Lab Results  Component Value Date  CHOL 152 09/08/2018   HDL 59 09/08/2018   LDLCALC 80 09/08/2018   TRIG 66 09/08/2018   CHOLHDL 2.6 09/08/2018    ROS: Constitutional: no weight gain/no weight loss, no fatigue, no subjective hyperthermia, no subjective hypothermia Eyes: no blurry vision, no xerophthalmia ENT: no sore throat, + see HPI Cardiovascular: no CP/no SOB/no palpitations/no leg swelling Respiratory: no cough/no SOB/no wheezing Gastrointestinal: no N/no V/no D/no C/no acid reflux Musculoskeletal: no muscle aches/no joint  aches Skin: no rashes, no hair loss Neurological: no tremors/no numbness/no tingling/no dizziness  I reviewed pt's medications, allergies, PMH, social hx, family hx, and changes were documented in the history of present illness. Otherwise, unchanged from my initial visit note.  Past Medical History:  Diagnosis Date  . CHF (congestive heart failure) ([place])   . Chronic systolic dysfunction of left ventricle   . Nonischemic cardiomyopathy ([place])   . NSVT (nonsustained ventricular tachycardia) ([place])   . Urinary incontinence    Past Surgical History:  Procedure Laterality Date  . ANKLE FRACTURE SURGERY    . KNEE SURGERY    . LEFT HEART CATHETERIZATION WITH CORONARY ANGIOGRAM N/A 09/04/2014   Procedure: LEFT HEART CATHETERIZATION WITH CORONARY ANGIOGRAM;  Surgeon: Sanda Klein, MD;  Location: [facility];  Service: Cardiovascular;  Laterality: N/A;   History   Social History  . Marital Status: Married    Spouse Name: N/A  . Number of Children: N/A   Occupational History  . N/a   Social History Main Topics  . Smoking status: Light Tobacco Smoker  . Smokeless tobacco: Not on file  . Alcohol Use: No  . Drug Use: No   Current Outpatient Medications on File Prior to Visit  Medication Sig Dispense Refill  . atorvastatin (LIPITOR) 40 MG tablet TAKE 1 TABLET BY MOUTH DAILY 90 tablet 1  . carvedilol (COREG) 12.5 MG tablet TAKE 1 AND 1/2 TABLET BY MOUTH TWICE DAILY WITH MEALS 270 tablet 0  . finasteride (PROSCAR) 5 MG tablet TAKE 1 TABLET(5 MG) BY MOUTH DAILY 90 tablet 0  . furosemide (LASIX) 20 MG tablet Take 1 tablet (20 mg total) by mouth every other day. (Patient not taking: Reported on 03/09/2019) 15 tablet 6  . hydrALAZINE (APRESOLINE) 25 MG tablet Take 1.5 tablets (37.5 mg total) 3 (three) times daily by mouth. (Patient not taking: Reported on 03/09/2019) 135 tablet 3  . isosorbide mononitrate (IMDUR) 60 MG 24 hr tablet Take 1 tablet (60 mg total) daily by mouth. 30 tablet 3  .  methimazole (TAPAZOLE) 5 MG tablet TAKE 1 TABLET BY MOUTH EVERY DAY 90 tablet 0  . nitroGLYCERIN (NITROSTAT) 0.4 MG SL tablet Place 1 tablet (0.4 mg total) under the tongue every 5 (five) minutes x 3 doses as needed for chest pain. 25 tablet 12  . rivaroxaban (XARELTO) 20 MG TABS tablet Take 1 tablet (20 mg total) by mouth daily with supper. Please schedule an appt for further refills 30 tablet 3   No current facility-administered medications on file prior to visit.    No Known Allergies   FH: - see HPI  PE: BP 128/70   Pulse 76   Ht 5\' 7"  (1.702 m) Comment: measured  Wt 163 lb (73.9 kg)   SpO2 97%   BMI 25.53 kg/m  Body mass index is 25.53 kg/m. Wt Readings from Last 3 Encounters:  04/17/19 163 lb (73.9 kg)  03/09/19 163 lb 9.6 oz (74.2 kg)  09/08/18 161 lb 12.8 oz (73.4 kg)   Constitutional:  normal weight, in NAD Eyes: PERRLA, EOMI, no exophthalmos ENT: moist mucous membranes, + left thyroid fullness, no cervical lymphadenopathy Cardiovascular: RRR, No MRG Respiratory: CTA B Gastrointestinal: abdomen soft, NT, ND, BS+ Musculoskeletal: no deformities, strength intact in all 4 Skin: moist, warm, no rashes Neurological: no tremor with outstretched hands, DTR normal in all 4  ASSESSMENT: 1. Subclinical Thyrotoxicosis  2. Goiter  PLAN:  1. Patient with history of TMNG, with subclinical thyrotoxicosis, now controlled on the low-dose methimazole, 5 mg daily.  He is also on beta-blocker (carvedilol) per cardiology.  He refused further investigation of his goiter and also refused RAI treatment.  He is asymptomatic, without weight loss, palpitations, tremors, anxiety.  However, he does have CHF, with an initial EF of 25%, however, 2D echo from 01/2017 showed an EF of 55 to 60%.  He refused an ICD in the past.   -At this visit, he denies chest pain/shortness of breath. -Latest TSH was reviewed and this was normal in 09/2018 -We discussed again about different treatment options for  his toxic multinodular goiter, but he prefers to continue with the low-dose methimazole, with which he is completely compliant (wife is organizing his pillbox). -We will recheck his TFTs today and will change the methimazole dose depending on the results. -I will see him back in a year but in 6 months for labs.  2. 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 -She denies shortness of breath, choking, dysphagia, hoarseness -We will continue to follow him clinically for this  Refuses flu shot today.  Needs refills - hold prescription at the pharmacy for next 3 mo.  Office Visit on 04/17/2019  Component Date Value Ref Range Status  . TSH 04/17/2019 0.97  0.35 - 4.50 uIU/mL Final  . Free T4 04/17/2019 0.97  0.60 - 1.60 ng/dL Final   Comment: Specimens from patients who are undergoing biotin therapy and /or ingesting biotin supplements may contain high levels of biotin.  The higher biotin concentration in these specimens interferes with this Free T4 assay.  Specimens that contain high levels  of biotin may cause false high results for this Free T4 assay.  Please interpret results in light of the total clinical presentation of the patient.    . T3, Free 04/17/2019 3.1  2.3 - 4.2 pg/mL Final   Normal TFTs.  Philemon Kingdom, MD PhD [facility]

## 2019-04-19 ENCOUNTER — Telehealth: Payer: Self-pay

## 2019-04-19 NOTE — Telephone Encounter (Signed)
-----   Message from Philemon Kingdom, MD sent at 04/17/2019  5:08 PM EDT ----- Lenna Sciara, can you please call pt: His thyroid tests are all normal.  Continue the current dose of methimazole.

## 2019-04-24 NOTE — Telephone Encounter (Signed)
Patient notified

## 2019-05-11 ENCOUNTER — Other Ambulatory Visit (HOSPITAL_COMMUNITY): Payer: Self-pay

## 2019-05-11 MED ORDER — CARVEDILOL 12.5 MG PO TABS: ORAL_TABLET | ORAL | 0 refills | Status: DC

## 2019-05-29 ENCOUNTER — Telehealth: Payer: Self-pay | Admitting: *Deleted

## 2019-05-29 NOTE — Telephone Encounter (Signed)
Schedule AWV.  

## 2019-06-08 ENCOUNTER — Telehealth: Payer: Self-pay | Admitting: *Deleted

## 2019-06-08 ENCOUNTER — Other Ambulatory Visit: Payer: Self-pay | Admitting: Emergency Medicine

## 2019-06-08 DIAGNOSIS — E785 Hyperlipidemia, unspecified: Secondary | ICD-10-CM

## 2019-06-08 NOTE — Telephone Encounter (Signed)
AWV schedule

## 2019-06-13 ENCOUNTER — Other Ambulatory Visit (HOSPITAL_COMMUNITY): Payer: Self-pay

## 2019-06-13 MED ORDER — CARVEDILOL 12.5 MG PO TABS: ORAL_TABLET | ORAL | 0 refills | Status: DC

## 2019-06-26 ENCOUNTER — Other Ambulatory Visit: Payer: Self-pay | Admitting: Emergency Medicine

## 2019-06-26 DIAGNOSIS — E785 Hyperlipidemia, unspecified: Secondary | ICD-10-CM

## 2019-06-26 MED ORDER — RIVAROXABAN 20 MG PO TABS: 20.0000 mg | ORAL_TABLET | Freq: Every day | ORAL | 3 refills | Status: DC

## 2019-06-26 NOTE — Telephone Encounter (Signed)
Pt needs refill on Atorvastatin 40MG  2)rivaroxaban (XARELTO) 20 MG TABS tablet Preferred pharmacy is [facility] [place], [place] Phone:  [phone]  Fax:  [phone]

## 2019-06-26 NOTE — Telephone Encounter (Signed)
Please see encounter below

## 2019-06-26 NOTE — Telephone Encounter (Signed)
Requested medication (s) are due for refill today: yes  Requested medication (s) are on the active medication list: yes  Future visit scheduled: yes  Notes to clinic: medication filled by different provider Patient also requesting Atorvastatin 40MG    Requested Prescriptions  Pending Prescriptions Disp Refills   rivaroxaban (XARELTO) 20 MG TABS tablet 30 tablet 3    Sig: Take 1 tablet (20 mg total) by mouth daily with supper. Please schedule an appt for further refills      Hematology: Anticoagulants - rivaroxaban Failed - 06/26/2019 12:40 PM      Failed - HCT in normal range and within 360 days    HCT  Date Value Ref Range Status  01/14/2015 32.1 (L) 38.4 - 49.9 % Final   Hematocrit  Date Value Ref Range Status  03/09/2019 33.1 (L) 37.5 - 51.0 % Final          Failed - HGB in normal range and within 360 days    Hemoglobin  Date Value Ref Range Status  03/09/2019 10.0 (L) 13.0 - 17.7 g/dL Final   HGB  Date Value Ref Range Status  01/14/2015 10.5 (L) 13.0 - 17.1 g/dL Final          Passed - ALT in normal range and within 180 days    ALT  Date Value Ref Range Status  03/09/2019 9 0 - 44 IU/L Final  01/14/2015 16 0 - 55 U/L Final          Passed - AST in normal range and within 180 days    AST  Date Value Ref Range Status  03/09/2019 12 0 - 40 IU/L Final  01/14/2015 17 5 - 34 U/L Final          Passed - Cr in normal range and within 360 days    Creatinine  Date Value Ref Range Status  01/14/2015 1.2 0.7 - 1.3 mg/dL Final   Creatinine, Ser  Date Value Ref Range Status  03/09/2019 0.97 0.76 - 1.27 mg/dL Final          Passed - PLT in normal range and within 360 days    Platelets  Date Value Ref Range Status  03/09/2019 183 150 - 450 x10E3/uL Final          Passed - Valid encounter within last 12 months    Recent Outpatient Visits           3 months ago Essential hypertension   [facility], Ines Bloomer, MD   9 months ago  Essential hypertension   [facility] [place], Ines Bloomer, MD   1 year ago Hyperlipidemia, unspecified hyperlipidemia type   [facility], Ranell Patrick, MD   2 years ago Dysuria   [facility], [place], [place]   2 years ago Bilateral lower extremity edema   [facility] at [facility], [place], [place]       Future Appointments             In 2 months [place], Ines Bloomer, MD [facility] [place], [facility]

## 2019-07-08 ENCOUNTER — Other Ambulatory Visit: Payer: Self-pay | Admitting: Emergency Medicine

## 2019-07-08 DIAGNOSIS — R399 Unspecified symptoms and signs involving the genitourinary system: Secondary | ICD-10-CM

## 2019-07-14 ENCOUNTER — Other Ambulatory Visit (HOSPITAL_COMMUNITY): Payer: Self-pay

## 2019-07-14 MED ORDER — CARVEDILOL 12.5 MG PO TABS: ORAL_TABLET | ORAL | 0 refills | Status: DC

## 2019-09-06 ENCOUNTER — Encounter: Payer: Self-pay | Admitting: Emergency Medicine

## 2019-09-06 ENCOUNTER — Other Ambulatory Visit: Payer: Self-pay

## 2019-09-06 ENCOUNTER — Ambulatory Visit (INDEPENDENT_AMBULATORY_CARE_PROVIDER_SITE_OTHER): Payer: Medicare HMO | Admitting: Emergency Medicine

## 2019-09-06 VITALS — BP 139/80 | HR 60 | Temp 98.0°F | Resp 18 | Ht 67.0 in | Wt 169.0 lb

## 2019-09-06 DIAGNOSIS — Z7901 Long term (current) use of anticoagulants: Secondary | ICD-10-CM

## 2019-09-06 DIAGNOSIS — E785 Hyperlipidemia, unspecified: Secondary | ICD-10-CM | POA: Diagnosis not present

## 2019-09-06 DIAGNOSIS — E859 Amyloidosis, unspecified: Secondary | ICD-10-CM

## 2019-09-06 DIAGNOSIS — I509 Heart failure, unspecified: Secondary | ICD-10-CM | POA: Diagnosis not present

## 2019-09-06 DIAGNOSIS — I10 Essential (primary) hypertension: Secondary | ICD-10-CM

## 2019-09-06 DIAGNOSIS — E0590 Thyrotoxicosis, unspecified without thyrotoxic crisis or storm: Secondary | ICD-10-CM | POA: Diagnosis not present

## 2019-09-06 DIAGNOSIS — I420 Dilated cardiomyopathy: Secondary | ICD-10-CM

## 2019-09-06 MED ORDER — CARVEDILOL 12.5 MG PO TABS: ORAL_TABLET | ORAL | 3 refills | Status: DC

## 2019-09-06 NOTE — Progress Notes (Signed)
Bryan Lamb 74 y.o.   Chief Complaint  Patient presents with  . Hypertension    6 month check     HISTORY OF PRESENT ILLNESS: This is a 74 y.o. male with multiple medical problems here for 75-month recheck and medication refill. Doing well.  Has no complaints or medical concerns today.  Compliant with medications.  Here today with his wife.  She has no complaints or medical concerns either. BP Readings from Last 3 Encounters:  09/06/19 139/80  04/17/19 128/70  03/09/19 (!) 149/73    HPI   Prior to Admission medications   Medication Sig Start Date End Date Taking? Authorizing Provider  carvedilol (COREG) 12.5 MG tablet TAKE 1 AND 1/2 TABLET BY MOUTH TWICE DAILY WITH MEALS. 09/06/19 12/07/19 Yes Yanelli Zapanta, Ines Bloomer, MD  finasteride (PROSCAR) 5 MG tablet TAKE 1 TABLET(5 MG) BY MOUTH DAILY 07/09/19  Yes Marvie Brevik, Ines Bloomer, MD  methimazole (TAPAZOLE) 5 MG tablet Take 1 tablet (5 mg total) by mouth daily. 04/17/19  Yes Philemon Kingdom, MD  nitroGLYCERIN (NITROSTAT) 0.4 MG SL tablet Place 1 tablet (0.4 mg total) under the tongue every 5 (five) minutes x 3 doses as needed for chest pain. 09/08/18  Yes Gabrial Domine, Ines Bloomer, MD  rivaroxaban (XARELTO) 20 MG TABS tablet Take 1 tablet (20 mg total) by mouth daily with supper. Please schedule an appt for further refills 06/26/19  Yes Myrick Mcnairy, Ines Bloomer, MD    No Known Allergies  Patient Active Problem List   Diagnosis Date Noted  . Chronic allergic rhinitis 04/16/2017  . 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 Hypertension 02/26/2015  . Monoclonal gammopathy 01/14/2015  . Amyloidosis ([place])   . 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 Chronic anticoagulation 09/13/2014  . 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 Tobacco abuse 09/04/2014  . Dyslipidemia 09/04/2014  . Lower urinary tract symptoms     Past Medical History:  Diagnosis Date  . CHF (congestive heart failure) ([place])   . Chronic systolic dysfunction of  left ventricle   . Nonischemic cardiomyopathy ([place])   . NSVT (nonsustained ventricular tachycardia) ([place])   . Urinary incontinence     Past Surgical History:  Procedure Laterality Date  . ANKLE FRACTURE SURGERY    . KNEE SURGERY    . LEFT HEART CATHETERIZATION WITH CORONARY ANGIOGRAM N/A 09/04/2014   Procedure: LEFT HEART CATHETERIZATION WITH CORONARY ANGIOGRAM;  Surgeon: Sanda Klein, MD;  Location: [facility];  Service: Cardiovascular;  Laterality: N/A;    Social History   Socioeconomic History  . Marital status: Married    Spouse name: Visua  . Number of children: 2  . Years of education: 10th grade  . Highest education level: Not on file  Occupational History  . Occupation: retired     Comment: Chief Technology Officer, mill work, [employer]  Tobacco Use  . Smoking status: Heavy Tobacco Smoker    Packs/day: 1.00    Years: 52.00    Pack years: 52.00  . Smokeless tobacco: Never Used  Substance and Sexual Activity  . Alcohol use: No    Alcohol/week: 0.0 standard drinks    Comment: remote heavy ETOH, none for years  . Drug use: No  . Sexual activity: Not on file  Other Topics Concern  . Not on file  Social History Narrative   Pt lives in [place] with spouse.  Previously worked at [employer].  Retired.  2 grown daughters are healthy.   Social Determinants of Health   Financial Resource Strain:   .  Difficulty of Paying Living Expenses: Not on file  Food Insecurity:   . Worried About Charity fundraiser in the Last Year: Not on file  . Ran Out of Food in the Last Year: Not on file  Transportation Needs:   . Lack of Transportation (Medical): Not on file  . Lack of Transportation (Non-Medical): Not on file  Physical Activity:   . Days of Exercise per Week: Not on file  . Minutes of Exercise per Session: Not on file  Stress:   . Feeling of Stress : Not on file  Social Connections:   . Frequency of Communication with Friends and Family: Not on file  .  Frequency of Social Gatherings with Friends and Family: Not on file  . Attends Religious Services: Not on file  . Active Member of Clubs or Organizations: Not on file  . Attends Archivist Meetings: Not on file  . Marital Status: Not on file  Intimate Partner Violence:   . Fear of Current or Ex-Partner: Not on file  . Emotionally Abused: Not on file  . Physically Abused: Not on file  . Sexually Abused: Not on file    Family History  Problem Relation Age of Onset  . Heart disease Unknown   . Stroke Unknown   . Cancer Sister        breast cancer     Review of Systems  Constitutional: Negative.  Negative for chills and fever.  HENT: Negative.  Negative for congestion and sore throat.   Respiratory: Negative.  Negative for cough and shortness of breath.   Cardiovascular: Negative.  Negative for chest pain and palpitations.  Gastrointestinal: Negative for abdominal pain, blood in stool, diarrhea, melena, nausea and vomiting.  Genitourinary: Negative.  Negative for dysuria and hematuria.  Musculoskeletal: Negative.  Negative for myalgias and neck pain.  Skin: Negative.  Negative for rash.  Neurological: Negative.  Negative for dizziness and headaches.  Endo/Heme/Allergies: Negative.   All other systems reviewed and are negative.    Today's Vitals   09/06/19 0929  BP: 139/80  Pulse: 60  Resp: 18  Temp: 98 F (36.7 C)  TempSrc: Temporal  SpO2: 98%  Weight: 169 lb (76.7 kg)  Height: 5\' 7"  (1.702 m)   Body mass index is 26.47 kg/m.   Physical Exam Vitals reviewed.  Constitutional:      Appearance: Normal appearance.  HENT:     Head: Normocephalic.  Eyes:     Extraocular Movements: Extraocular movements intact.     Conjunctiva/sclera: Conjunctivae normal.     Pupils: Pupils are equal, round, and reactive to light.  Cardiovascular:     Rate and Rhythm: Normal rate and regular rhythm.     Pulses: Normal pulses.     Heart sounds: Normal heart sounds.   Pulmonary:     Effort: Pulmonary effort is normal.     Breath sounds: Normal breath sounds.  Abdominal:     General: There is no distension.     Palpations: Abdomen is soft.     Tenderness: There is no abdominal tenderness.  Musculoskeletal:        General: Normal range of motion.     Cervical back: Normal range of motion and neck supple.  Skin:    General: Skin is warm and dry.     Capillary Refill: Capillary refill takes less than 2 seconds.  Neurological:     General: No focal deficit present.     Mental Status: He is  alert and oriented to person, place, and time.  Psychiatric:        Mood and Affect: Mood normal.        Behavior: Behavior normal.      ASSESSMENT & PLAN: Clinically stable.  No medical concerns identified during this visit.  Continue present medications.  No changes.  Delphine was seen today for hypertension.  Diagnoses and all orders for this visit:  Nonischemic dilated cardiomyopathy ([place]) -     CBC with Differential/Platelet -     Comprehensive metabolic panel -     carvedilol (COREG) 12.5 MG tablet; TAKE 1 AND 1/2 TABLET BY MOUTH TWICE DAILY WITH MEALS.  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  If you have not heard from [place] then please contact us. The fastest way to get your results is to register for My Chart.   IF you received an x-ray today, you will receive an invoice from [facility]. Please contact [facility] at [phone] with questions or concerns regarding your invoice.   IF you received labwork today, you will  receive an invoice from [facility]. Please contact [facility] at [phone] with questions or concerns regarding your invoice.   Our billing staff will not be able to assist you with questions regarding bills from these companies.  You will be contacted with the lab results as soon as they are available. The fastest way to get your results is to activate your My Chart account. Instructions are located on the last page of this paperwork. If you have not heard from [place] regarding the results in 2 weeks, please contact this office.      Health Maintenance After Age 47 After age 59, you are at a higher risk for certain long-term diseases and infections as well as injuries from falls. Falls are a major cause of broken bones and head injuries in people who are older than age 21. Getting regular preventive care can help to keep you healthy and well. Preventive care includes getting regular testing and making lifestyle changes as recommended by your health care provider. Talk with your health care provider about:  Which screenings and tests you should have. A screening is a test that checks for a disease when you have no symptoms.  A diet and exercise plan that is right for you. What should I know about screenings and tests to prevent falls? Screening and testing are the best ways to find a health problem early. Early diagnosis and treatment give you the best chance of managing medical conditions that are common after age 69. Certain conditions and lifestyle choices may make you more likely to have a fall. Your health care provider may recommend:  Regular vision checks. Poor vision and conditions such as cataracts can make you more likely to have a fall. If you wear glasses, make sure to get your prescription updated if your vision changes.  Medicine review. Work with your health care provider to regularly review all of the medicines you are taking, including over-the-counter medicines. Ask your health care  provider about any side  effects that may make you more likely to have a fall. Tell your health care provider if any medicines that you take make you feel dizzy or sleepy.  Osteoporosis screening. Osteoporosis is a condition that causes the bones to get weaker. This can make the bones weak and cause them to break more easily.  Blood pressure screening. Blood pressure changes and medicines to control blood pressure can make you feel dizzy.  Strength and balance checks. Your health care provider may recommend certain tests to check your strength and balance while standing, walking, or changing positions.  Foot health exam. Foot pain and numbness, as well as not wearing proper footwear, can make you more likely to have a fall.  Depression screening. You may be more likely to have a fall if you have a fear of falling, feel emotionally low, or feel unable to do activities that you used to do.  Alcohol use screening. Using too much alcohol can affect your balance and may make you more likely to have a fall. What actions can I take to lower my risk of falls? General instructions  Talk with your health care provider about your risks for falling. Tell your health care provider if: ? You fall. Be sure to tell your health care provider about all falls, even ones that seem minor. ? You feel dizzy, sleepy, or off-balance.  Take over-the-counter and prescription medicines only as told by your health care provider. These include any supplements.  Eat a healthy diet and maintain a healthy weight. A healthy diet includes low-fat dairy products, low-fat (lean) meats, and fiber from whole grains, beans, and lots of fruits and vegetables. Home safety  Remove any tripping hazards, such as rugs, cords, and clutter.  Install safety equipment such as grab bars in bathrooms and safety rails on stairs.  Keep rooms and walkways well-lit. Activity   Follow a regular exercise program to stay fit. This will help  you maintain your balance. Ask your health care provider what types of exercise are appropriate for you.  If you need a cane or walker, use it as recommended by your health care provider.  Wear supportive shoes that have nonskid soles. Lifestyle  Do not drink alcohol if your health care provider tells you not to drink.  If you drink alcohol, limit how much you have: ? 0-1 drink a day for women. ? 0-2 drinks a day for men.  Be aware of how much alcohol is in your drink. In the [place] one drink equals one typical bottle of beer (12 oz), one-half glass of wine (5 oz), or one shot of hard liquor (1 oz).  Do not use any products that contain nicotine or tobacco, such as cigarettes and e-cigarettes. If you need help quitting, ask your health care provider. Summary  Having a healthy lifestyle and getting preventive care can help to protect your health and wellness after age 56.  Screening and testing are the best way to find a health problem early and help you avoid having a fall. Early diagnosis and treatment give you the best chance for managing medical conditions that are more common for people who are older than age 85.  Falls are a major cause of broken bones and head injuries in people who are older than age 13. Take precautions to prevent a fall at home.  Work with your health care provider to learn what changes you can make to improve your health and wellness and to prevent falls. This information  is not intended to replace advice given to you by your health care provider. Make sure you discuss any questions you have with your health care provider. Document Revised: 10/13/2018 Document Reviewed: 05/05/2017 Elsevier Patient Education  [place].  Hypertension, Adult High blood pressure (hypertension) is when the force of blood pumping through the arteries is too strong. The arteries are the blood vessels that carry blood from the heart throughout the body. Hypertension forces  the heart to work harder to pump blood and may cause arteries to become narrow or stiff. Untreated or uncontrolled hypertension can cause a heart attack, heart failure, a stroke, kidney disease, and other problems. A blood pressure reading consists of a higher number over a lower number. Ideally, your blood pressure should be below 120/80. The first ("top") number is called the systolic pressure. It is a measure of the pressure in your arteries as your heart beats. The second ("bottom") number is called the diastolic pressure. It is a measure of the pressure in your arteries as the heart relaxes. What are the causes? The exact cause of this condition is not known. There are some conditions that result in or are related to high blood pressure. What increases the risk? Some risk factors for high blood pressure are under your control. The following factors may make you more likely to develop this condition:  Smoking.  Having type 2 diabetes mellitus, high cholesterol, or both.  Not getting enough exercise or physical activity.  Being overweight.  Having too much fat, sugar, calories, or salt (sodium) in your diet.  Drinking too much alcohol. Some risk factors for high blood pressure may be difficult or impossible to change. Some of these factors include:  Having chronic kidney disease.  Having a family history of high blood pressure.  Age. Risk increases with age.  Race. You may be at higher risk if you are African American.  Gender. Men are at higher risk than women before age 59. After age 78, women are at higher risk than men.  Having obstructive sleep apnea.  Stress. What are the signs or symptoms? High blood pressure may not cause symptoms. Very high blood pressure (hypertensive crisis) may cause:  Headache.  Anxiety.  Shortness of breath.  Nosebleed.  Nausea and vomiting.  Vision changes.  Severe chest pain.  Seizures. How is this diagnosed? This condition is  diagnosed by measuring your blood pressure while you are seated, with your arm resting on a flat surface, your legs uncrossed, and your feet flat on the floor. The cuff of the blood pressure monitor will be placed directly against the skin of your upper arm at the level of your heart. It should be measured at least twice using the same arm. Certain conditions can cause a difference in blood pressure between your right and left arms. Certain factors can cause blood pressure readings to be lower or higher than normal for a short period of time:  When your blood pressure is higher when you are in a health care provider's office than when you are at home, this is called white coat hypertension. Most people with this condition do not need medicines.  When your blood pressure is higher at home than when you are in a health care provider's office, this is called masked hypertension. Most people with this condition may need medicines to control blood pressure. If you have a high blood pressure reading during one visit or you have normal blood pressure with other risk factors,  you may be asked to:  Return on a different day to have your blood pressure checked again.  Monitor your blood pressure at home for 1 week or longer. If you are diagnosed with hypertension, you may have other blood or imaging tests to help your health care provider understand your overall risk for other conditions. How is this treated? This condition is treated by making healthy lifestyle changes, such as eating healthy foods, exercising more, and reducing your alcohol intake. Your health care provider may prescribe medicine if lifestyle changes are not enough to get your blood pressure under control, and if:  Your systolic blood pressure is above 130.  Your diastolic blood pressure is above 80. Your personal target blood pressure may vary depending on your medical conditions, your age, and other factors. Follow these instructions at  home: Eating and drinking   Eat a diet that is high in fiber and potassium, and low in sodium, added sugar, and fat. An example eating plan is called the DASH (Dietary Approaches to Stop Hypertension) diet. To eat this way: ? Eat plenty of fresh fruits and vegetables. Try to fill one half of your plate at each meal with fruits and vegetables. ? Eat whole grains, such as whole-wheat pasta, brown rice, or whole-grain bread. Fill about one fourth of your plate with whole grains. ? Eat or drink low-fat dairy products, such as skim milk or low-fat yogurt. ? Avoid fatty cuts of meat, processed or cured meats, and poultry with skin. Fill about one fourth of your plate with lean proteins, such as fish, chicken without skin, beans, eggs, or tofu. ? Avoid pre-made and processed foods. These tend to be higher in sodium, added sugar, and fat.  Reduce your daily sodium intake. Most people with hypertension should eat less than 1,500 mg of sodium a day.  Do not drink alcohol if: ? Your health care provider tells you not to drink. ? You are pregnant, may be pregnant, or are planning to become pregnant.  If you drink alcohol: ? Limit how much you use to:  0-1 drink a day for women.  0-2 drinks a day for men. ? Be aware of how much alcohol is in your drink. In the [place] one drink equals one 12 oz bottle of beer (355 mL), one 5 oz glass of wine (148 mL), or one 1 oz glass of hard liquor (44 mL). Lifestyle   Work with your health care provider to maintain a healthy body weight or to lose weight. Ask what an ideal weight is for you.  Get at least 30 minutes of exercise most days of the week. Activities may include walking, swimming, or biking.  Include exercise to strengthen your muscles (resistance exercise), such as Pilates or lifting weights, as part of your weekly exercise routine. Try to do these types of exercises for 30 minutes at least 3 days a week.  Do not use any products that contain  nicotine or tobacco, such as cigarettes, e-cigarettes, and chewing tobacco. If you need help quitting, ask your health care provider.  Monitor your blood pressure at home as told by your health care provider.  Keep all follow-up visits as told by your health care provider. This is important. Medicines  Take over-the-counter and prescription medicines only as told by your health care provider. Follow directions carefully. Blood pressure medicines must be taken as prescribed.  Do not skip doses of blood pressure medicine. Doing this puts you at risk for problems and  can make the medicine less effective.  Ask your health care provider about side effects or reactions to medicines that you should watch for. Contact a health care provider if you:  Think you are having a reaction to a medicine you are taking.  Have headaches that keep coming back (recurring).  Feel dizzy.  Have swelling in your ankles.  Have trouble with your vision. Get help right away if you:  Develop a severe headache or confusion.  Have unusual weakness or numbness.  Feel faint.  Have severe pain in your chest or abdomen.  Vomit repeatedly.  Have trouble breathing. Summary  Hypertension is when the force of blood pumping through your arteries is too strong. If this condition is not controlled, it may put you at risk for serious complications.  Your personal target blood pressure may vary depending on your medical conditions, your age, and other factors. For most people, a normal blood pressure is less than 120/80.  Hypertension is treated with lifestyle changes, medicines, or a combination of both. Lifestyle changes include losing weight, eating a healthy, low-sodium diet, exercising more, and limiting alcohol. This information is not intended to replace advice given to you by your health care provider. Make sure you discuss any questions you have with your health care provider. Document Revised: 03/02/2018  Document Reviewed: 03/02/2018 Elsevier Patient Education  2020 [employer] Inc.      Agustina Caroli, MD [facility]

## 2019-09-06 NOTE — Patient Instructions (Addendum)
If you have lab work done today you will be contacted with your lab results within the next 2 weeks.  If you have not heard from [place] then please contact us. The fastest way to get your results is to register for My Chart.   IF you received an x-ray today, you will receive an invoice from [facility]. Please contact [facility] at [phone] with questions or concerns regarding your invoice.   IF you received labwork today, you will receive an invoice from [place]. Please contact [facility] at [phone] with questions or concerns regarding your invoice.   Our billing staff will not be able to assist you with questions regarding bills from these companies.  You will be contacted with the lab results as soon as they are available. The fastest way to get your results is to activate your My Chart account. Instructions are located on the last page of this paperwork. If you have not heard from [place] regarding the results in 2 weeks, please contact this office.      Health Maintenance After Age 36 After age 63, you are at a higher risk for certain long-term diseases and infections as well as injuries from falls. Falls are a major cause of broken bones and head injuries in people who are older than age 25. Getting regular preventive care can help to keep you healthy and well. Preventive care includes getting regular testing and making lifestyle changes as recommended by your health care provider. Talk with your health care provider about:  Which screenings and tests you should have. A screening is a test that checks for a disease when you have no symptoms.  A diet and exercise plan that is right for you. What should I know about screenings and tests to prevent falls? Screening and testing are the best ways to find a health problem early. Early diagnosis and treatment give you the best chance of managing medical conditions that are common after age 57. Certain conditions and  lifestyle choices may make you more likely to have a fall. Your health care provider may recommend:  Regular vision checks. Poor vision and conditions such as cataracts can make you more likely to have a fall. If you wear glasses, make sure to get your prescription updated if your vision changes.  Medicine review. Work with your health care provider to regularly review all of the medicines you are taking, including over-the-counter medicines. Ask your health care provider about any side effects that may make you more likely to have a fall. Tell your health care provider if any medicines that you take make you feel dizzy or sleepy.  Osteoporosis screening. Osteoporosis is a condition that causes the bones to get weaker. This can make the bones weak and cause them to break more easily.  Blood pressure screening. Blood pressure changes and medicines to control blood pressure can make you feel dizzy.  Strength and balance checks. Your health care provider may recommend certain tests to check your strength and balance while standing, walking, or changing positions.  Foot health exam. Foot pain and numbness, as well as not wearing proper footwear, can make you more likely to have a fall.  Depression screening. You may be more likely to have a fall if you have a fear of falling, feel emotionally low, or feel unable to do activities that you used to do.  Alcohol use screening. Using too much alcohol can affect your balance and may make you more likely  have a fall. What actions can I take to lower my risk of falls? General instructions  Talk with your health care provider about your risks for falling. Tell your health care provider if: ? You fall. Be sure to tell your health care provider about all falls, even ones that seem minor. ? You feel dizzy, sleepy, or off-balance.  Take over-the-counter and prescription medicines only as told by your health care provider. These include any  supplements.  Eat a healthy diet and maintain a healthy weight. A healthy diet includes low-fat dairy products, low-fat (lean) meats, and fiber from whole grains, beans, and lots of fruits and vegetables. Home safety  Remove any tripping hazards, such as rugs, cords, and clutter.  Install safety equipment such as grab bars in bathrooms and safety rails on stairs.  Keep rooms and walkways well-lit. Activity   Follow a regular exercise program to stay fit. This will help you maintain your balance. Ask your health care provider what types of exercise are appropriate for you.  If you need a cane or walker, use it as recommended by your health care provider.  Wear supportive shoes that have nonskid soles. Lifestyle  Do not drink alcohol if your health care provider tells you not to drink.  If you drink alcohol, limit how much you have: ? 0-1 drink a day for women. ? 0-2 drinks a day for men.  Be aware of how much alcohol is in your drink. In the [place] one drink equals one typical bottle of beer (12 oz), one-half glass of wine (5 oz), or one shot of hard liquor (1 oz).  Do not use any products that contain nicotine or tobacco, such as cigarettes and e-cigarettes. If you need help quitting, ask your health care provider. Summary  Having a healthy lifestyle and getting preventive care can help to protect your health and wellness after age 65.  Screening and testing are the best way to find a health problem early and help you avoid having a fall. Early diagnosis and treatment give you the best chance for managing medical conditions that are more common for people who are older than age 65.  Falls are a major cause of broken bones and head injuries in people who are older than age 65. Take precautions to prevent a fall at home.  Work with your health care provider to learn what changes you can make to improve your health and wellness and to prevent falls. This information is not intended  to replace advice given to you by your health care provider. Make sure you discuss any questions you have with your health care provider. Document Revised: 10/13/2018 Document Reviewed: 05/05/2017 Elsevier Patient Education  2020 [employer] Inc[employer]  Hypertension, Adult High blood pressure (hypertension) is when the force of blood pumping through the arteries is too strong. The arteries are the blood vessels that carry blood from the heart throughout the body. Hypertension forces the heart to work harder to pump blood and may cause arteries to become narrow or stiff. Untreated or uncontrolled hypertension can cause a heart attack, heart failure, a stroke, kidney disease, and other problems. A blood pressure reading consists of a higher number over a lower number. Ideally, your blood pressure should be below 120/80. The first ("top") number is called the systolic pressure. It is a measure of the pressure in your arteries as your heart beats. The second ("bottom") number is called the diastolic pressure. It is a measure of the pressure in   your arteries as the heart relaxes. What are the causes? The exact cause of this condition is not known. There are some conditions that result in or are related to high blood pressure. What increases the risk? Some risk factors for high blood pressure are under your control. The following factors may make you more likely to develop this condition:  Smoking.  Having type 2 diabetes mellitus, high cholesterol, or both.  Not getting enough exercise or physical activity.  Being overweight.  Having too much fat, sugar, calories, or salt (sodium) in your diet.  Drinking too much alcohol. Some risk factors for high blood pressure may be difficult or impossible to change. Some of these factors include:  Having chronic kidney disease.  Having a family history of high blood pressure.  Age. Risk increases with age.  Race. You may be at higher risk if you are African  American.  Gender. Men are at higher risk than women before age 45. After age 65, women are at higher risk than men.  Having obstructive sleep apnea.  Stress. What are the signs or symptoms? High blood pressure may not cause symptoms. Very high blood pressure (hypertensive crisis) may cause:  Headache.  Anxiety.  Shortness of breath.  Nosebleed.  Nausea and vomiting.  Vision changes.  Severe chest pain.  Seizures. How is this diagnosed? This condition is diagnosed by measuring your blood pressure while you are seated, with your arm resting on a flat surface, your legs uncrossed, and your feet flat on the floor. The cuff of the blood pressure monitor will be placed directly against the skin of your upper arm at the level of your heart. It should be measured at least twice using the same arm. Certain conditions can cause a difference in blood pressure between your right and left arms. Certain factors can cause blood pressure readings to be lower or higher than normal for a short period of time:  When your blood pressure is higher when you are in a health care provider's office than when you are at home, this is called white coat hypertension. Most people with this condition do not need medicines.  When your blood pressure is higher at home than when you are in a health care provider's office, this is called masked hypertension. Most people with this condition may need medicines to control blood pressure. If you have a high blood pressure reading during one visit or you have normal blood pressure with other risk factors, you may be asked to:  Return on a different day to have your blood pressure checked again.  Monitor your blood pressure at home for 1 week or longer. If you are diagnosed with hypertension, you may have other blood or imaging tests to help your health care provider understand your overall risk for other conditions. How is this treated? This condition is treated by  making healthy lifestyle changes, such as eating healthy foods, exercising more, and reducing your alcohol intake. Your health care provider may prescribe medicine if lifestyle changes are not enough to get your blood pressure under control, and if:  Your systolic blood pressure is above 130.  Your diastolic blood pressure is above 80. Your personal target blood pressure may vary depending on your medical conditions, your age, and other factors. Follow these instructions at home: Eating and drinking   Eat a diet that is high in fiber and potassium, and low in sodium, added sugar, and fat. An example eating plan is called the DASH (  Dietary Approaches to Stop Hypertension) diet. To eat this way: ? Eat plenty of fresh fruits and vegetables. Try to fill one half of your plate at each meal with fruits and vegetables. ? Eat whole grains, such as whole-wheat pasta, brown rice, or whole-grain bread. Fill about one fourth of your plate with whole grains. ? Eat or drink low-fat dairy products, such as skim milk or low-fat yogurt. ? Avoid fatty cuts of meat, processed or cured meats, and poultry with skin. Fill about one fourth of your plate with lean proteins, such as fish, chicken without skin, beans, eggs, or tofu. ? Avoid pre-made and processed foods. These tend to be higher in sodium, added sugar, and fat.  Reduce your daily sodium intake. Most people with hypertension should eat less than 1,500 mg of sodium a day.  Do not drink alcohol if: ? Your health care provider tells you not to drink. ? You are pregnant, may be pregnant, or are planning to become pregnant.  If you drink alcohol: ? Limit how much you use to:  0-1 drink a day for women.  0-2 drinks a day for men. ? Be aware of how much alcohol is in your drink. In the [place] one drink equals one 12 oz bottle of beer (355 mL), one 5 oz glass of wine (148 mL), or one 1 oz glass of hard liquor (44 mL). Lifestyle   Work with your health  care provider to maintain a healthy body weight or to lose weight. Ask what an ideal weight is for you.  Get at least 30 minutes of exercise most days of the week. Activities may include walking, swimming, or biking.  Include exercise to strengthen your muscles (resistance exercise), such as Pilates or lifting weights, as part of your weekly exercise routine. Try to do these types of exercises for 30 minutes at least 3 days a week.  Do not use any products that contain nicotine or tobacco, such as cigarettes, e-cigarettes, and chewing tobacco. If you need help quitting, ask your health care provider.  Monitor your blood pressure at home as told by your health care provider.  Keep all follow-up visits as told by your health care provider. This is important. Medicines  Take over-the-counter and prescription medicines only as told by your health care provider. Follow directions carefully. Blood pressure medicines must be taken as prescribed.  Do not skip doses of blood pressure medicine. Doing this puts you at risk for problems and can make the medicine less effective.  Ask your health care provider about side effects or reactions to medicines that you should watch for. Contact a health care provider if you:  Think you are having a reaction to a medicine you are taking.  Have headaches that keep coming back (recurring).  Feel dizzy.  Have swelling in your ankles.  Have trouble with your vision. Get help right away if you:  Develop a severe headache or confusion.  Have unusual weakness or numbness.  Feel faint.  Have severe pain in your chest or abdomen.  Vomit repeatedly.  Have trouble breathing. Summary  Hypertension is when the force of blood pumping through your arteries is too strong. If this condition is not controlled, it may put you at risk for serious complications.  Your personal target blood pressure may vary depending on your medical conditions, your age, and  other factors. For most people, a normal blood pressure is less than 120/80.  Hypertension is treated with lifestyle changes, medicines,   or a combination of both. Lifestyle changes include losing weight, eating a healthy, low-sodium diet, exercising more, and limiting alcohol. This information is not intended to replace advice given to you by your health care provider. Make sure you discuss any questions you have with your health care provider. Document Revised: 03/02/2018 Document Reviewed: 03/02/2018 Elsevier Patient Education  2020 Elsevier Inc.  

## 2019-09-07 LAB — COMPREHENSIVE METABOLIC PANEL
ALT: 8 IU/L (ref 0–44)
AST: 12 IU/L (ref 0–40)
Albumin/Globulin Ratio: 1.3 (ref 1.2–2.2)
Albumin: 4.2 g/dL (ref 3.7–4.7)
Alkaline Phosphatase: 97 IU/L (ref 39–117)
BUN/Creatinine Ratio: 11 (ref 10–24)
BUN: 11 mg/dL (ref 8–27)
Bilirubin Total: 0.5 mg/dL (ref 0.0–1.2)
CO2: 21 mmol/L (ref 20–29)
Calcium: 9.2 mg/dL (ref 8.6–10.2)
Chloride: 105 mmol/L (ref 96–106)
Creatinine, Ser: 1.02 mg/dL (ref 0.76–1.27)
GFR calc Af Amer: 83 mL/min/{1.73_m2} (ref >59)
GFR calc non Af Amer: 72 mL/min/{1.73_m2} (ref >59)
Globulin, Total: 3.3 g/dL (ref 1.5–4.5)
Glucose: 88 mg/dL (ref 65–99)
Potassium: 4.8 mmol/L (ref 3.5–5.2)
Sodium: 140 mmol/L (ref 134–144)
Total Protein: 7.5 g/dL (ref 6.0–8.5)

## 2019-09-07 LAB — CBC WITH DIFFERENTIAL/PLATELET
Basophils Absolute: 0.1 10*3/uL (ref 0.0–0.2)
Basos: 1 %
EOS (ABSOLUTE): 0.3 10*3/uL (ref 0.0–0.4)
Eos: 6 %
Hematocrit: 38.4 % (ref 37.5–51.0)
Hemoglobin: 12.1 g/dL — ABNORMAL LOW (ref 13.0–17.7)
Immature Grans (Abs): 0 10*3/uL (ref 0.0–0.1)
Immature Granulocytes: 0 %
Lymphocytes Absolute: 1.3 10*3/uL (ref 0.7–3.1)
Lymphs: 28 %
MCH: 28.1 pg (ref 26.6–33.0)
MCHC: 31.5 g/dL (ref 31.5–35.7)
MCV: 89 fL (ref 79–97)
Monocytes Absolute: 0.6 10*3/uL (ref 0.1–0.9)
Monocytes: 13 %
Neutrophils Absolute: 2.4 10*3/uL (ref 1.4–7.0)
Neutrophils: 52 %
Platelets: 165 10*3/uL (ref 150–450)
RBC: 4.3 x10E6/uL (ref 4.14–5.80)
RDW: 14.8 % (ref 11.6–15.4)
WBC: 4.5 10*3/uL (ref 3.4–10.8)

## 2019-09-07 LAB — TSH: TSH: 1.04 u[IU]/mL (ref 0.450–4.500)

## 2019-09-07 LAB — LIPID PANEL
Chol/HDL Ratio: 2.7 ratio (ref 0.0–5.0)
Cholesterol, Total: 160 mg/dL (ref 100–199)
HDL: 60 mg/dL (ref >39)
LDL Chol Calc (NIH): 90 mg/dL (ref 0–99)
Triglycerides: 49 mg/dL (ref 0–149)
VLDL Cholesterol Cal: 10 mg/dL (ref 5–40)

## 2019-10-09 ENCOUNTER — Other Ambulatory Visit: Payer: Self-pay | Admitting: *Deleted

## 2019-10-09 DIAGNOSIS — R399 Unspecified symptoms and signs involving the genitourinary system: Secondary | ICD-10-CM

## 2019-10-09 MED ORDER — FINASTERIDE 5 MG PO TABS: ORAL_TABLET | ORAL | 0 refills | Status: DC

## 2019-10-23 ENCOUNTER — Other Ambulatory Visit: Payer: Self-pay | Admitting: Emergency Medicine

## 2019-10-31 ENCOUNTER — Telehealth: Payer: Self-pay | Admitting: *Deleted

## 2019-10-31 NOTE — Telephone Encounter (Signed)
Schedule AWV.  

## 2019-11-14 ENCOUNTER — Encounter: Payer: Self-pay | Admitting: Emergency Medicine

## 2019-11-14 ENCOUNTER — Ambulatory Visit (INDEPENDENT_AMBULATORY_CARE_PROVIDER_SITE_OTHER): Payer: Medicare HMO | Admitting: Emergency Medicine

## 2019-11-14 ENCOUNTER — Other Ambulatory Visit: Payer: Self-pay

## 2019-11-14 ENCOUNTER — Telehealth: Payer: Self-pay | Admitting: *Deleted

## 2019-11-14 VITALS — BP 160/73 | HR 58 | Temp 98.1°F | Resp 16 | Ht 67.0 in | Wt 175.0 lb

## 2019-11-14 DIAGNOSIS — E0590 Thyrotoxicosis, unspecified without thyrotoxic crisis or storm: Secondary | ICD-10-CM

## 2019-11-14 DIAGNOSIS — Z1211 Encounter for screening for malignant neoplasm of colon: Secondary | ICD-10-CM

## 2019-11-14 DIAGNOSIS — E859 Amyloidosis, unspecified: Secondary | ICD-10-CM | POA: Diagnosis not present

## 2019-11-14 DIAGNOSIS — R42 Dizziness and giddiness: Secondary | ICD-10-CM

## 2019-11-14 DIAGNOSIS — I509 Heart failure, unspecified: Secondary | ICD-10-CM | POA: Diagnosis not present

## 2019-11-14 DIAGNOSIS — I420 Dilated cardiomyopathy: Secondary | ICD-10-CM

## 2019-11-14 DIAGNOSIS — Z7901 Long term (current) use of anticoagulants: Secondary | ICD-10-CM | POA: Diagnosis not present

## 2019-11-14 DIAGNOSIS — I10 Essential (primary) hypertension: Secondary | ICD-10-CM

## 2019-11-14 DIAGNOSIS — E785 Hyperlipidemia, unspecified: Secondary | ICD-10-CM | POA: Diagnosis not present

## 2019-11-14 LAB — POCT CBC
Granulocyte percent: 54.6 % (ref 37–80)
HCT, POC: 36.6 % (ref 29–41)
Hemoglobin: 11.4 g/dL (ref 11–14.6)
Lymph, poc: 1.4 (ref 0.6–3.4)
MCH, POC: 27.8 pg (ref 27–31.2)
MCHC: 31.2 g/dL — AB (ref 31.8–35.4)
MCV: 89.1 fL (ref 76–111)
MID (cbc): 0.5 (ref 0–0.9)
MPV: 9.3 fL (ref 0–99.8)
POC Granulocyte: 2.3 (ref 2–6.9)
POC LYMPH PERCENT: 33.9 % (ref 10–50)
POC MID %: 11.5 % (ref 0–12)
Platelet Count, POC: 131 K/uL — AB (ref 142–424)
RBC: 4.1 M/uL — AB (ref 4.69–6.13)
RDW, POC: 15.4 %
WBC: 4.2 K/uL — AB (ref 4.6–10.2)

## 2019-11-14 NOTE — Patient Instructions (Addendum)
   If you have lab work done today you will be contacted with your lab results within the next 2 weeks.  If you have not heard from us then please contact us. The fastest way to get your results is to register for My Chart.   IF you received an x-ray today, you will receive an invoice from [facility]. Please contact [facility] at [phone] with questions or concerns regarding your invoice.   IF you received labwork today, you will receive an invoice from [facility]. Please contact [facility] at [phone] with questions or concerns regarding your invoice.   Our billing staff will not be able to assist you with questions regarding bills from these companies.  You will be contacted with the lab results as soon as they are available. The fastest way to get your results is to activate your My Chart account. Instructions are located on the last page of this paperwork. If you have not heard from us regarding the results in 2 weeks, please contact this office.     Dizziness Dizziness is a common problem. It makes you feel unsteady or light-headed. You may feel like you are about to pass out (faint). Dizziness can lead to getting hurt if you stumble or fall. Dizziness can be caused by many things, including:  Medicines.  Not having enough water in your body (dehydration).  Illness. Follow these instructions at home: Eating and drinking   Drink enough fluid to keep your pee (urine) clear or pale yellow. This helps to keep you from getting dehydrated. Try to drink more clear fluids, such as water.  Do not drink alcohol.  Limit how much caffeine you drink or eat, if your doctor tells you to do that.  Limit how much salt (sodium) you drink or eat, if your doctor tells you to do that. Activity   Avoid making quick movements. ? When you stand up from sitting in a chair, steady yourself until you feel okay. ? In the morning, first sit up on the side of the bed. When  you feel okay, stand slowly while you hold onto something. Do this until you know that your balance is fine.  If you need to stand in one place for a long time, move your legs often. Tighten and relax the muscles in your legs while you are standing.  Do not drive or use heavy machinery if you feel dizzy.  Avoid bending down if you feel dizzy. Place items in your home so you can reach them easily without leaning over. Lifestyle  Do not use any products that contain nicotine or tobacco, such as cigarettes and e-cigarettes. If you need help quitting, ask your doctor.  Try to lower your stress level. You can do this by using methods such as yoga or meditation. Talk with your doctor if you need help. General instructions  Watch your dizziness for any changes.  Take over-the-counter and prescription medicines only as told by your doctor. Talk with your doctor if you think that you are dizzy because of a medicine that you are taking.  Tell a friend or a family member that you are feeling dizzy. If he or she notices any changes in your behavior, have this person call your doctor.  Keep all follow-up visits as told by your doctor. This is important. Contact a doctor if:  Your dizziness does not go away.  Your dizziness or light-headedness gets worse.  You feel sick to your stomach (nauseous).  You   have trouble hearing.  You have new symptoms.  You are unsteady on your feet.  You feel like the room is spinning. Get help right away if:  You throw up (vomit) or have watery poop (diarrhea), and you cannot eat or drink anything.  You have trouble: ? Talking. ? Walking. ? Swallowing. ? Using your arms, hands, or legs.  You feel generally weak.  You are not thinking clearly, or you have trouble forming sentences. A friend or family member may notice this.  You have: ? Chest pain. ? Pain in your belly (abdomen). ? Shortness of breath. ? Sweating.  Your vision changes.  You  are bleeding.  You have a very bad headache.  You have neck pain or a stiff neck.  You have a fever. These symptoms may be an emergency. Do not wait to see if the symptoms will go away. Get medical help right away. Call your local emergency services (911 in the [place] Do not drive yourself to the hospital. Summary  Dizziness makes you feel unsteady or light-headed. You may feel like you are about to pass out (faint).  Drink enough fluid to keep your pee (urine) clear or pale yellow. Do not drink alcohol.  Avoid making quick movements if you feel dizzy.  Watch your dizziness for any changes. This information is not intended to replace advice given to you by your health care provider. Make sure you discuss any questions you have with your health care provider. Document Revised: 06/25/2017 Document Reviewed: 07/09/2016 Elsevier Patient Education  2020 [employer] Inc.  

## 2019-11-14 NOTE — Telephone Encounter (Signed)
Faxed cologuard requisition to [facility]. Confirmation page 3:14 pm.

## 2019-11-14 NOTE — Progress Notes (Signed)
Bryan Lamb 74 y.o.   Chief Complaint  Patient presents with  . Dizziness    started about a couple weeks ago and will start and stop, per pt    HISTORY OF PRESENT ILLNESS: This is a 74 y.o. male complaining of intermittent episodes of dizziness for the last week.  These last anything between 15 and 30 minutes.  No syncope, difficulty breathing, chest pain, nausea or vomiting, visual disturbances, abdominal pain, diarrhea, urinary symptoms, abnormal gait or balance problems, or any other significant symptoms.  No new medications.  No lifestyle changes. Denies headaches or flulike symptoms, fever or chills, speech problems or extremity weakness.  HPI   Prior to Admission medications   Medication Sig Start Date End Date Taking? Authorizing Provider  carvedilol (COREG) 12.5 MG tablet TAKE 1 AND 1/2 TABLET BY MOUTH TWICE DAILY WITH MEALS. 09/06/19 12/07/19 Yes Kirin Brandenburger, Ines Bloomer, MD  finasteride (PROSCAR) 5 MG tablet TAKE 1 TABLET(5 MG) BY MOUTH DAILY 10/09/19  Yes Rollin Kotowski, Ines Bloomer, MD  nitroGLYCERIN (NITROSTAT) 0.4 MG SL tablet Place 1 tablet (0.4 mg total) under the tongue every 5 (five) minutes x 3 doses as needed for chest pain. 09/08/18  Yes Zayneb Baucum, Ines Bloomer, MD  XARELTO 20 MG TABS tablet TAKE 1 TABLET BY MOUTH ONCE DAILY WITH SUPPER 10/23/19  Yes Fate Caster, Ines Bloomer, MD  methimazole (TAPAZOLE) 5 MG tablet Take 1 tablet (5 mg total) by mouth daily. Patient not taking: Reported on 11/14/2019 04/17/19   Philemon Kingdom, MD    No Known Allergies  Patient Active Problem List   Diagnosis Date Noted  . Chronic allergic rhinitis 04/16/2017  . 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 Hypertension 02/26/2015  . Monoclonal gammopathy 01/14/2015  . Amyloidosis ([place])   . 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 Chronic anticoagulation 09/13/2014  . 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 Tobacco abuse 09/04/2014  . Dyslipidemia 09/04/2014  . Lower urinary tract  symptoms     Past Medical History:  Diagnosis Date  . CHF (congestive heart failure) ([place])   . Chronic systolic dysfunction of left ventricle   . Nonischemic cardiomyopathy ([place])   . NSVT (nonsustained ventricular tachycardia) ([place])   . Urinary incontinence     Past Surgical History:  Procedure Laterality Date  . ANKLE FRACTURE SURGERY    . KNEE SURGERY    . LEFT HEART CATHETERIZATION WITH CORONARY ANGIOGRAM N/A 09/04/2014   Procedure: LEFT HEART CATHETERIZATION WITH CORONARY ANGIOGRAM;  Surgeon: Sanda Klein, MD;  Location: [facility];  Service: Cardiovascular;  Laterality: N/A;    Social History   Socioeconomic History  . Marital status: Married    Spouse name: Visua  . Number of children: 2  . Years of education: 10th grade  . Highest education level: Not on file  Occupational History  . Occupation: retired     Comment: Chief Technology Officer, mill work, [employer]  Tobacco Use  . Smoking status: Heavy Tobacco Smoker    Packs/day: 1.00    Years: 52.00    Pack years: 52.00  . Smokeless tobacco: Never Used  Substance and Sexual Activity  . Alcohol use: No    Alcohol/week: 0.0 standard drinks    Comment: remote heavy ETOH, none for years  . Drug use: No  . Sexual activity: Not on file  Other Topics Concern  . Not on file  Social History Narrative   Pt lives in [place] with spouse.  Previously worked at [employer].  Retired.  2 grown daughters  are healthy.   Social Determinants of Health   Financial Resource Strain:   . Difficulty of Paying Living Expenses:   Food Insecurity:   . Worried About Charity fundraiser in the Last Year:   . Arboriculturist in the Last Year:   Transportation Needs:   . Film/video editor (Medical):   Marland Kitchen Lack of Transportation (Non-Medical):   Physical Activity:   . Days of Exercise per Week:   . Minutes of Exercise per Session:   Stress:   . Feeling of Stress :   Social Connections:   . Frequency of  Communication with Friends and Family:   . Frequency of Social Gatherings with Friends and Family:   . Attends Religious Services:   . Active Member of Clubs or Organizations:   . Attends Archivist Meetings:   Marland Kitchen Marital Status:   Intimate Partner Violence:   . Fear of Current or Ex-Partner:   . Emotionally Abused:   Marland Kitchen Physically Abused:   . Sexually Abused:     Family History  Problem Relation Age of Onset  . Heart disease Unknown   . Stroke Unknown   . Cancer Sister        breast cancer     Review of Systems  Constitutional: Negative.  Negative for chills and fever.  HENT: Negative.  Negative for congestion and sore throat.   Eyes: Negative.  Negative for blurred vision and double vision.  Respiratory: Negative.  Negative for cough and shortness of breath.   Cardiovascular: Negative.  Negative for chest pain and palpitations.  Gastrointestinal: Negative.  Negative for abdominal pain, blood in stool, diarrhea, melena, nausea and vomiting.  Genitourinary: Negative.  Negative for dysuria and hematuria.  Musculoskeletal: Negative.  Negative for back pain, myalgias and neck pain.  Skin: Negative.  Negative for rash.  Neurological: Negative.  Negative for dizziness, sensory change, speech change, focal weakness, seizures, loss of consciousness and headaches.  Endo/Heme/Allergies: Negative.   All other systems reviewed and are negative.  Today's Vitals   11/14/19 1455  BP: (!) 160/73  Pulse: (!) 58  Resp: 16  Temp: 98.1 F (36.7 C)  TempSrc: Temporal  SpO2: 99%  Weight: 175 lb (79.4 kg)  Height: 5\' 7"  (1.702 m)   Body mass index is 27.41 kg/m.  Orthostatic VS for the past 24 hrs (Last 3 readings):  BP- Lying Pulse- Lying BP- Sitting Pulse- Sitting BP- Standing at 0 minutes Pulse- Standing at 0 minutes  11/14/19 1532 169/75 54 160/79 56 171/80 58    Physical Exam Vitals reviewed.  Constitutional:      Appearance: Normal appearance.  HENT:     Head:  Normocephalic.     Right Ear: Tympanic membrane, ear canal and external ear normal.     Left Ear: Tympanic membrane, ear canal and external ear normal.     Nose: Nose normal.     Mouth/Throat:     Mouth: Mucous membranes are moist.     Pharynx: Oropharynx is clear.  Eyes:     Extraocular Movements: Extraocular movements intact.     Conjunctiva/sclera: Conjunctivae normal.     Pupils: Pupils are equal, round, and reactive to light.  Neck:     Vascular: No carotid bruit.  Cardiovascular:     Rate and Rhythm: Normal rate and regular rhythm.     Pulses: Normal pulses.     Heart sounds: Normal heart sounds.  Pulmonary:     Effort:  Pulmonary effort is normal.     Breath sounds: Normal breath sounds.  Abdominal:     Palpations: Abdomen is soft.     Tenderness: There is no abdominal tenderness.  Musculoskeletal:        General: Normal range of motion.     Cervical back: Normal range of motion and neck supple. No tenderness.  Lymphadenopathy:     Cervical: No cervical adenopathy.  Skin:    General: Skin is warm and dry.     Capillary Refill: Capillary refill takes less than 2 seconds.  Neurological:     General: No focal deficit present.     Mental Status: He is alert and oriented to person, place, and time.     Cranial Nerves: No cranial nerve deficit.     Sensory: No sensory deficit.     Motor: No weakness.     Coordination: Coordination normal.     Gait: Gait normal.     Deep Tendon Reflexes: Reflexes normal.  Psychiatric:        Mood and Affect: Mood normal.        Behavior: Behavior normal.    Results for orders placed or performed in visit on 11/14/19 (from the past 24 hour(s))  POCT CBC     Status: Abnormal   Collection Time: 11/14/19  3:45 PM  Result Value Ref Range   WBC 4.2 (A) 4.6 - 10.2 K/uL   Lymph, poc 1.4 0.6 - 3.4   POC LYMPH PERCENT 33.9 10 - 50 %L   MID (cbc) 0.5 0 - 0.9   POC MID % 11.5 0 - 12 %M   POC Granulocyte 2.3 2 - 6.9   Granulocyte percent 54.6  37 - 80 %G   RBC 4.10 (A) 4.69 - 6.13 M/uL   Hemoglobin 11.4 11 - 14.6 g/dL   HCT, POC 36.6 29 - 41 %   MCV 89.1 76 - 111 fL   MCH, POC 27.8 27 - 31.2 pg   MCHC 31.2 (A) 31.8 - 35.4 g/dL   RDW, POC 15.4 %   Platelet Count, POC 131 (A) 142 - 424 K/uL   MPV 9.3 0 - 99.8 fL    A total of 45 minutes was spent with the patient, greater than 50% of which was in counseling/coordination of care regarding differential diagnosis of dizziness, review of all chronic medical problems and management, need for diagnostic work-up including blood work, review of most recent blood work results including today's CBC, review of most recent office visit notes, diet and nutrition, prognosis and need for follow-up.  ASSESSMENT & PLAN: Bryan Lamb was seen today for dizziness.  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  If you have not heard from [place] then please contact us. The fastest way to get your results is to register for My Chart.   IF you received an x-ray today, you will receive an invoice  from [facility]. Please contact [facility] at [phone] with questions or concerns regarding your invoice.   IF you received labwork today, you will receive an invoice from [facility]. Please contact [facility] at [phone] with questions or concerns regarding your invoice.   Our billing staff will not be able to assist you with questions  regarding bills from these companies.  You will be contacted with the lab results as soon as they are available. The fastest way to get your results is to activate your My Chart account. Instructions are located on the last page of this paperwork. If you have not heard from [place] regarding the results in 2 weeks, please contact this office.     Dizziness Dizziness is a common problem. It makes you feel unsteady or light-headed. You may feel like you are about to pass out (faint). Dizziness can lead to getting hurt if you stumble or fall. Dizziness can be caused by many things, including:  Medicines.  Not having enough water in your body (dehydration).  Illness. Follow these instructions at home: Eating and drinking   Drink enough fluid to keep your pee (urine) clear or pale yellow. This helps to keep you from getting dehydrated. Try to drink more clear fluids, such as water.  Do not drink alcohol.  Limit how much caffeine you drink or eat, if your doctor tells you to do that.  Limit how much salt (sodium) you drink or eat, if your doctor tells you to do that. Activity   Avoid making quick movements. ? When you stand up from sitting in a chair, steady yourself until you feel okay. ? In the morning, first sit up on the side of the bed. When you feel okay, stand slowly while you hold onto something. Do this until you know that your balance is fine.  If you need to stand in one place for a long time, move your legs often. Tighten and relax the muscles in your legs while you are standing.  Do not drive or use heavy machinery if you feel dizzy.  Avoid bending down if you feel dizzy. Place items in your home so you can reach them easily without leaning over. Lifestyle  Do not use any products that contain nicotine or tobacco, such as cigarettes and e-cigarettes. If you need help quitting, ask your doctor.  Try to lower your stress level. You can do this by using methods such as yoga or  meditation. Talk with your doctor if you need help. General instructions  Watch your dizziness for any changes.  Take over-the-counter and prescription medicines only as told by your doctor. Talk with your doctor if you think that you are dizzy because of a medicine that you are taking.  Tell a friend or a family member that you are feeling dizzy. If he or she notices any changes in your behavior, have this person call your doctor.  Keep all follow-up visits as told by your doctor. This is important. Contact a doctor if:  Your dizziness does not go away.  Your dizziness or light-headedness gets worse.  You feel sick to your stomach (nauseous).  You have trouble hearing.  You have new symptoms.  You are unsteady on your feet.  You feel like the room is spinning. Get help right away if:  You throw up (vomit) or have watery poop (diarrhea), and you cannot eat or drink anything.  You have trouble: ? Talking. ? Walking. ? Swallowing. ? Using your arms, hands,  or legs.  You feel generally weak.  You are not thinking clearly, or you have trouble forming sentences. A friend or family member may notice this.  You have: ? Chest pain. ? Pain in your belly (abdomen). ? Shortness of breath. ? Sweating.  Your vision changes.  You are bleeding.  You have a very bad headache.  You have neck pain or a stiff neck.  You have a fever. These symptoms may be an emergency. Do not wait to see if the symptoms will go away. Get medical help right away. Call your local emergency services (911 in the [place] Do not drive yourself to the hospital. Summary  Dizziness makes you feel unsteady or light-headed. You may feel like you are about to pass out (faint).  Drink enough fluid to keep your pee (urine) clear or pale yellow. Do not drink alcohol.  Avoid making quick movements if you feel dizzy.  Watch your dizziness for any changes. This information is not intended to replace advice  given to you by your health care provider. Make sure you discuss any questions you have with your health care provider. Document Revised: 06/25/2017 Document Reviewed: 07/09/2016 Elsevier Patient Education  2020 Elsevier Inc.      Agustina Caroli, MD [facility]

## 2019-11-15 LAB — COMPREHENSIVE METABOLIC PANEL
ALT: 8 IU/L (ref 0–44)
AST: 14 IU/L (ref 0–40)
Albumin/Globulin Ratio: 1.3 (ref 1.2–2.2)
Albumin: 3.9 g/dL (ref 3.7–4.7)
Alkaline Phosphatase: 85 IU/L (ref 39–117)
BUN/Creatinine Ratio: 10 (ref 10–24)
BUN: 10 mg/dL (ref 8–27)
Bilirubin Total: 0.6 mg/dL (ref 0.0–1.2)
CO2: 22 mmol/L (ref 20–29)
Calcium: 8.9 mg/dL (ref 8.6–10.2)
Chloride: 107 mmol/L — ABNORMAL HIGH (ref 96–106)
Creatinine, Ser: 0.96 mg/dL (ref 0.76–1.27)
GFR calc Af Amer: 89 mL/min/{1.73_m2} (ref >59)
GFR calc non Af Amer: 77 mL/min/{1.73_m2} (ref >59)
Globulin, Total: 3 g/dL (ref 1.5–4.5)
Glucose: 88 mg/dL (ref 65–99)
Potassium: 4.3 mmol/L (ref 3.5–5.2)
Sodium: 141 mmol/L (ref 134–144)
Total Protein: 6.9 g/dL (ref 6.0–8.5)

## 2019-11-15 NOTE — Progress Notes (Signed)
LM for Pt to call back for his results

## 2019-12-22 ENCOUNTER — Other Ambulatory Visit: Payer: Self-pay | Admitting: Emergency Medicine

## 2019-12-22 DIAGNOSIS — R399 Unspecified symptoms and signs involving the genitourinary system: Secondary | ICD-10-CM

## 2020-01-25 ENCOUNTER — Telehealth: Payer: Self-pay | Admitting: *Deleted

## 2020-01-25 NOTE — Telephone Encounter (Signed)
Schedule AWV.  

## 2020-02-20 ENCOUNTER — Other Ambulatory Visit: Payer: Self-pay | Admitting: Emergency Medicine

## 2020-02-20 NOTE — Telephone Encounter (Signed)
Requested Prescriptions  Pending Prescriptions Disp Refills   XARELTO 20 MG TABS tablet [Pharmacy Med Name: XARELTO 20MG  TABLETS] 90 tablet 2    Sig: TAKE 1 TABLET BY MOUTH ONCE DAILY WITH SUPPER     Hematology: Anticoagulants - rivaroxaban Failed - 02/20/2020  6:34 AM      Failed - PLT in normal range and within 360 days    Platelets  Date Value Ref Range Status  09/06/2019 165 150 - 450 x10E3/uL Final   Platelet Count, POC  Date Value Ref Range Status  11/14/2019 131 (A) 142 - 424 K/uL Final         Passed - ALT in normal range and within 180 days    ALT  Date Value Ref Range Status  11/14/2019 8 0 - 44 IU/L Final  01/14/2015 16 0 - 55 U/L Final         Passed - AST in normal range and within 180 days    AST  Date Value Ref Range Status  11/14/2019 14 0 - 40 IU/L Final  01/14/2015 17 5 - 34 U/L Final         Passed - Cr in normal range and within 360 days    Creatinine  Date Value Ref Range Status  01/14/2015 1.2 0.7 - 1.3 mg/dL Final   Creatinine, Ser  Date Value Ref Range Status  11/14/2019 0.96 0.76 - 1.27 mg/dL Final         Passed - HCT in normal range and within 360 days    HCT  Date Value Ref Range Status  01/14/2015 32.1 (L) 38 - 49 % Final   HCT, POC  Date Value Ref Range Status  11/14/2019 36.6 29 - 41 % Final   Hematocrit  Date Value Ref Range Status  09/06/2019 38.4 37.5 - 51.0 % Final         Passed - HGB in normal range and within 360 days    Hemoglobin  Date Value Ref Range Status  11/14/2019 11.4 11 - 14.6 g/dL Final  09/06/2019 12.1 (L) 13.0 - 17.7 g/dL Final   HGB  Date Value Ref Range Status  01/14/2015 10.5 (L) 13.0 - 17.1 g/dL Final         Passed - Valid encounter within last 12 months    Recent Outpatient Visits          3 months ago Dizziness   [facility] [place], [place], MD   5 months ago Nonischemic dilated cardiomyopathy [facility])   [facility] at [place], Ines Bloomer, MD   11 months ago  Essential hypertension   [facility] at [place], Ines Bloomer, MD   1 year ago Essential hypertension   [facility] at [place], Ines Bloomer, MD   1 year ago Hyperlipidemia, unspecified hyperlipidemia type   [facility] at [place], Ranell Patrick, MD      Future Appointments            In 2 weeks Sagardia, Ines Bloomer, MD [facility] at [place], [facility]

## 2020-03-06 ENCOUNTER — Other Ambulatory Visit: Payer: Self-pay

## 2020-03-06 ENCOUNTER — Encounter: Payer: Self-pay | Admitting: Emergency Medicine

## 2020-03-06 ENCOUNTER — Ambulatory Visit (INDEPENDENT_AMBULATORY_CARE_PROVIDER_SITE_OTHER): Payer: Medicare HMO | Admitting: Emergency Medicine

## 2020-03-06 VITALS — BP 150/60 | HR 64 | Temp 97.9°F | Ht 67.0 in | Wt 171.2 lb

## 2020-03-06 DIAGNOSIS — E859 Amyloidosis, unspecified: Secondary | ICD-10-CM | POA: Diagnosis not present

## 2020-03-06 DIAGNOSIS — E0590 Thyrotoxicosis, unspecified without thyrotoxic crisis or storm: Secondary | ICD-10-CM | POA: Diagnosis not present

## 2020-03-06 DIAGNOSIS — I5022 Chronic systolic (congestive) heart failure: Secondary | ICD-10-CM | POA: Diagnosis not present

## 2020-03-06 DIAGNOSIS — D472 Monoclonal gammopathy: Secondary | ICD-10-CM

## 2020-03-06 DIAGNOSIS — Z8639 Personal history of other endocrine, nutritional and metabolic disease: Secondary | ICD-10-CM | POA: Diagnosis not present

## 2020-03-06 DIAGNOSIS — I110 Hypertensive heart disease with heart failure: Secondary | ICD-10-CM

## 2020-03-06 DIAGNOSIS — I420 Dilated cardiomyopathy: Secondary | ICD-10-CM | POA: Diagnosis not present

## 2020-03-06 NOTE — Patient Instructions (Signed)
Bleeding Precautions When on Anticoagulant Therapy, Adult Anticoagulant therapy, also called blood thinner therapy, is medicine that helps to prevent and treat blood clots. The medicine works by stopping blood clots from forming or growing. Blood clots that form in your blood vessels can be dangerous. They can break loose and travel to the heart, lungs, or brain. This increases the risk of a heart attack, stroke, or blocked lung artery (pulmonary embolism). Anticoagulants also increase the risk of bleeding. Try to protect yourself from cuts and other injuries that can cause bleeding. It is important to take anticoagulants exactly as told by your health care provider. Why do I need to be on anticoagulant therapy? You may need this medicine if you are at risk of developing a blood clot. Conditions that increase your risk of a blood clot include:  Being born with heart disease or a heart malformation (congenital heart disease).  Developing heart disease.  Having had surgery, such as valve replacement.  Having had a serious accident or other type of severe injury (trauma).  Having certain types of cancer.  Having certain diseases that can increase blood clotting.  Having a high risk of stroke or heart attack.  Having atrial fibrillation (AF). What are the common anticoagulant medicines? There are several types of anticoagulant medicines. The most common types are:  Medicines that you take by mouth (oral medicines), such as: ? Warfarin. ? Novel oral anticoagulants (NOACs), such as:  Direct thrombin inhibitors (dabigatran).  Factor Xa inhibitors (apixaban, edoxaban, and rivaroxaban).  Injections, such as: ? Unfractionated heparin. ? Low molecular weight heparin. These anticoagulants work in different ways to prevent blood clots. They also have different risks and side effects. What do I need to remember while on anticoagulant therapy? Taking anticoagulants  Take your medicine at the  same time every day. If you forget to take your medicine, take it as soon as you remember. Do not double your dosage of medicine if you miss a whole day. Take your normal dose and call your health care provider.  Do not stop taking your medicine unless your health care provider approves. Stopping the medicine can increase your risk of developing a blood clot. Taking other medicines  Take over-the-counter and prescriptions medicines only as told by your health care provider.  Do not take over-the-counter NSAIDs, including aspirin and ibuprofen, while you are on anticoagulant therapy. These medicines increase your risk of dangerous bleeding.  Get approval from your health care provider before you start taking any new medicines, vitamins, or herbal products. Some of these could interfere with your therapy. General instructions  Keep all follow-up visits as told by your health care provider. This is important.  If you are pregnant or trying to get pregnant, talk with a health care provider about anticoagulants. Some of these medicines are not safe to take during pregnancy.  Tell all health care providers, including your dentist, that you are on anticoagulant therapy. It is especially important to tell providers before you have any surgery, medical procedures, or dental work done. What precautions should I take?   Be very careful when using knives, scissors, or other sharp objects.  Use an electric razor instead of a blade.  Do not use toothpicks.  Use a soft-bristled toothbrush. Brush your teeth gently.  Always wear shoes outdoors and wear slippers indoors.  Be careful when cutting your fingernails and toenails.  Place bath mats in the bathroom. If possible, install handrails as well.  Wear gloves while you do  yard work.  Wear your seat belt.  Prevent falls by removing loose rugs and extension cords from areas where you walk. Use a cane or walker if you need it.  Avoid  constipation by: ? Drinking enough fluid to keep your urine clear or pale yellow. ? Eating foods that are high in fiber, such as fresh fruits and vegetables, whole grains, and beans. ? Limiting foods that are high in fat and processed sugars, such as fried and sweet foods.  Do not play contact sports or participate in other activities that have a high risk for injury. What other precautions are important if on warfarin therapy? If you are taking a type of anticoagulant called warfarin, make sure you:  Work with a diet and nutrition specialist (dietitian) to make an eating plan. Do not make any sudden changes to your diet after you have started your eating plan.  Do not drink alcohol. It can interfere with your medicine and increase your risk of an injury that causes bleeding.  Get regular blood tests as told by your health care provider. What are some questions to ask my health care provider?  Why do I need anticoagulant therapy?  What is the best anticoagulant therapy for my condition?  How long will I need anticoagulant therapy?  What are the side effects of anticoagulant therapy?  When should I take my medicine? What should I do if I forget to take it?  Will I need to have regular blood tests?  Do I need to change my diet? Are there foods or drinks that I should avoid?  What activities are safe for me?  What should I do if I want to get pregnant? Contact a health care provider if:  You miss a dose of medicine: ? And you are not sure what to do. ? For more than one day.  You have: ? Menstrual bleeding that is heavier than normal. ? Bloody or brown urine. ? Easy bruising. ? Black and tarry stool or bright red stool. ? Side effects from your medicine.  You feel weak or dizzy.  You become pregnant. Get help right away if:  You have bleeding that will not stop within 20 minutes from: ? The nose. ? The gums. ? A cut on the skin.  You have a severe headache or  stomachache.  You vomit or cough up blood.  You fall or hit your head. Summary  Anticoagulant therapy, also called blood thinner therapy, is medicine that helps to prevent and treat blood clots.  Anticoagulants work in different ways to prevent blood clots. They also have different risks and side effects.  Talk with your health care provider about any precautions that you should take while on anticoagulant therapy. This information is not intended to replace advice given to you by your health care provider. Make sure you discuss any questions you have with your health care provider. Document Revised: 10/12/2018 Document Reviewed: 09/08/2016 Elsevier Patient Education  [place]. Heart Failure, Self Care Heart failure is a serious condition. This sheet explains things you need to do to take care of yourself at home. To help you stay as healthy as possible, you may be asked to change your diet, take certain medicines, and make other changes in your life. Your doctor may also give you more specific instructions. If you have problems or questions, call your doctor. What are the risks? Having heart failure makes it more likely for you to have some problems. These problems  can get worse if you do not take good care of yourself. Problems may include:  Blood clotting problems. This may cause a stroke.  Damage to the kidneys, liver, or lungs.  Abnormal heart rhythms. Supplies needed:  Scale for weighing yourself.  Blood pressure monitor.  Notebook.  Medicines. How to care for yourself when you have heart failure Medicines Take over-the-counter and prescription medicines only as told by your doctor. Take your medicines every day.  Do not stop taking your medicine unless your doctor tells you to do so.  Do not skip any medicines.  Get your prescriptions refilled before you run out of medicine. This is important. Eating and drinking   Eat heart-healthy foods. Talk with a  diet specialist (dietitian) to create an eating plan.  Choose foods that: ? Have no trans fat. ? Are low in saturated fat and cholesterol.  Choose healthy foods, such as: ? Fresh or frozen fruits and vegetables. ? Fish. ? Low-fat (lean) meats. ? Legumes, such as beans, peas, and lentils. ? Fat-free or low-fat dairy products. ? Whole-grain foods. ? High-fiber foods.  Limit salt (sodium) if told by your doctor. Ask your diet specialist to tell you which seasonings are healthy for your heart.  Cook in healthy ways instead of frying. Healthy ways of cooking include roasting, grilling, broiling, baking, poaching, steaming, and stir-frying.  Limit how much fluid you drink, if told by your doctor. Alcohol use  Do not drink alcohol if: ? Your doctor tells you not to drink. ? Your heart was damaged by alcohol, or you have very bad heart failure. ? You are pregnant, may be pregnant, or are planning to become pregnant.  If you drink alcohol: ? Limit how much you use to:  0-1 drink a day for women.  0-2 drinks a day for men. ? Be aware of how much alcohol is in your drink. In the [place] one drink equals one 12 oz bottle of beer (355 mL), one 5 oz glass of wine (148 mL), or one 1 oz glass of hard liquor (44 mL). Lifestyle   Do not use any products that contain nicotine or tobacco, such as cigarettes, e-cigarettes, and chewing tobacco. If you need help quitting, ask your doctor. ? Do not use nicotine gum or patches before talking to your doctor.  Do not use illegal drugs.  Lose weight if told by your doctor.  Do physical activity if told by your doctor. Talk to your doctor before you begin an exercise if: ? You are an older adult. ? You have very bad heart failure.  Learn to manage stress. If you need help, ask your doctor.  Get rehab (rehabilitation) to help you stay independent and to help with your quality of life.  Plan time to rest when you get tired. Check weight and  blood pressure   Weigh yourself every day. This will help you to know if fluid is building up in your body. ? Weigh yourself every morning after you pee (urinate) and before you eat breakfast. ? Wear the same amount of clothing each time. ? Write down your daily weight. Give your record to your doctor.  Check and write down your blood pressure as told by your doctor.  Check your pulse as told by your doctor. Dealing with very hot and very cold weather  If it is very hot: ? Avoid activities that take a lot of energy. ? Use air conditioning or fans, or find a cooler place. ?  Avoid caffeine and alcohol. ? Wear clothing that is loose-fitting, lightweight, and light-colored.  If it is very cold: ? Avoid activities that take a lot of energy. ? Layer your clothes. ? Wear mittens or gloves, a hat, and a scarf when you go outside. ? Avoid alcohol. Follow these instructions at home:  Stay up to date with shots (vaccines). Get pneumococcal and flu (influenza) shots.  Keep all follow-up visits as told by your doctor. This is important. Contact a doctor if:  You gain weight quickly.  You have increasing shortness of breath.  You cannot do your normal activities.  You get tired easily.  You cough a lot.  You don't feel like eating or feel like you may vomit (nauseous).  You become puffy (swell) in your hands, feet, ankles, or belly (abdomen).  You cannot sleep well because it is hard to breathe.  You feel like your heart is beating fast (palpitations).  You get dizzy when you stand up. Get help right away if:  You have trouble breathing.  You or someone else notices a change in your behavior, such as having trouble staying awake.  You have chest pain or discomfort.  You pass out (faint). These symptoms may be an emergency. Do not wait to see if the symptoms will go away. Get medical help right away. Call your local emergency services (911 in the [place] Do not drive  yourself to the hospital. Summary  Heart failure is a serious condition. To care for yourself, you may have to change your diet, take medicines, and make other lifestyle changes.  Take your medicines every day. Do not stop taking them unless your doctor tells you to do so.  Eat heart-healthy foods, such as fresh or frozen fruits and vegetables, fish, lean meats, legumes, fat-free or low-fat dairy products, and whole-grain or high-fiber foods.  Ask your doctor if you can drink alcohol. You may have to stop alcohol use if you have very bad heart failure.  Contact your doctor if you gain weight quickly or feel that your heart is beating too fast. Get help right away if you pass out, or have chest pain or trouble breathing. This information is not intended to replace advice given to you by your health care provider. Make sure you discuss any questions you have with your health care provider. Document Revised: 10/04/2018 Document Reviewed: 10/05/2018 Elsevier Patient Education  [place].

## 2020-03-06 NOTE — Progress Notes (Signed)
Bryan Lamb 74 y.o.   Chief Complaint  Patient presents with  . Medical Management of Chronic Issues    6 m f/u     HISTORY OF PRESENT ILLNESS: This is a 74 y.o. male with multiple chronic medical problems here for follow-up. Doing well.  No complaints or medical concerns today. Fully vaccinated against Covid. Has history of nonischemic cardiomyopathy with chronic systolic congestive heart failure. On long-term anticoagulation with Xarelto. Takes beta-blocker carvedilol 12.5 mg 1-1/2 tablets twice a day. No need for nitroglycerin tablet use. Also has a thyroid condition for which he takes Tapazole 5 mg daily. Takes Proscar 5 mg daily for lower urinary tract symptoms.  HPI   Prior to Admission medications   Medication Sig Start Date End Date Taking? Authorizing Provider  carvedilol (COREG) 12.5 MG tablet TAKE 1 AND 1/2 TABLET BY MOUTH TWICE DAILY WITH MEALS. 09/06/19 03/06/20 Yes Chelsea Nusz, Ines Bloomer, MD  finasteride (PROSCAR) 5 MG tablet TAKE 1 TABLET(5 MG) BY MOUTH DAILY 12/22/19  Yes Lynsey Ange, Ines Bloomer, MD  methimazole (TAPAZOLE) 5 MG tablet Take 1 tablet (5 mg total) by mouth daily. 04/17/19  Yes Philemon Kingdom, MD  nitroGLYCERIN (NITROSTAT) 0.4 MG SL tablet Place 1 tablet (0.4 mg total) under the tongue every 5 (five) minutes x 3 doses as needed for chest pain. 09/08/18  Yes Fanta Wimberley, Ines Bloomer, MD  XARELTO 20 MG TABS tablet TAKE 1 TABLET BY MOUTH ONCE DAILY WITH SUPPER 02/20/20  Yes Clela Hagadorn, Ines Bloomer, MD    No Known Allergies  Patient Active Problem List   Diagnosis Date Noted  . Chronic allergic rhinitis 04/16/2017  . 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 Hypertension 02/26/2015  . Monoclonal gammopathy 01/14/2015  . Amyloidosis ([place])   . 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 Chronic anticoagulation 09/13/2014  . 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 Tobacco abuse 09/04/2014  . Dyslipidemia 09/04/2014  . Lower urinary tract symptoms       Past Medical History:  Diagnosis Date  . CHF (congestive heart failure) ([place])   . Chronic systolic dysfunction of left ventricle   . Nonischemic cardiomyopathy ([place])   . NSVT (nonsustained ventricular tachycardia) ([place])   . Urinary incontinence     Past Surgical History:  Procedure Laterality Date  . ANKLE FRACTURE SURGERY    . KNEE SURGERY    . LEFT HEART CATHETERIZATION WITH CORONARY ANGIOGRAM N/A 09/04/2014   Procedure: LEFT HEART CATHETERIZATION WITH CORONARY ANGIOGRAM;  Surgeon: Sanda Klein, MD;  Location: [facility];  Service: Cardiovascular;  Laterality: N/A;    Social History   Socioeconomic History  . Marital status: Married    Spouse name: Visua  . Number of children: 2  . Years of education: 10th grade  . Highest education level: Not on file  Occupational History  . Occupation: retired     Comment: Chief Technology Officer, mill work, [employer]  Tobacco Use  . Smoking status: Heavy Tobacco Smoker    Packs/day: 1.00    Years: 52.00    Pack years: 52.00  . Smokeless tobacco: Never Used  Vaping Use  . Vaping Use: Never used  Substance and Sexual Activity  . Alcohol use: No    Alcohol/week: 0.0 standard drinks    Comment: remote heavy ETOH, none for years  . Drug use: No  . Sexual activity: Not on file  Other Topics Concern  . Not on file  Social History Narrative   Pt lives in [place] with spouse.  Previously worked at [employer].  Retired.  2 grown daughters are healthy.   Social Determinants of Health   Financial Resource Strain:   . Difficulty of Paying Living Expenses: Not on file  Food Insecurity:   . Worried About Charity fundraiser in the Last Year: Not on file  . Ran Out of Food in the Last Year: Not on file  Transportation Needs:   . Lack of Transportation (Medical): Not on file  . Lack of Transportation (Non-Medical): Not on file  Physical Activity:   . Days of Exercise per Week: Not on file  . Minutes of Exercise per  Session: Not on file  Stress:   . Feeling of Stress : Not on file  Social Connections:   . Frequency of Communication with Friends and Family: Not on file  . Frequency of Social Gatherings with Friends and Family: Not on file  . Attends Religious Services: Not on file  . Active Member of Clubs or Organizations: Not on file  . Attends Archivist Meetings: Not on file  . Marital Status: Not on file  Intimate Partner Violence:   . Fear of Current or Ex-Partner: Not on file  . Emotionally Abused: Not on file  . Physically Abused: Not on file  . Sexually Abused: Not on file    Family History  Problem Relation Age of Onset  . Heart disease Unknown   . Stroke Unknown   . Cancer Sister        breast cancer     Review of Systems  Constitutional: Negative.  Negative for chills and fever.  HENT: Negative.  Negative for congestion and sore throat.   Respiratory: Negative.  Negative for cough and shortness of breath.   Cardiovascular: Negative.  Negative for chest pain and palpitations.  Gastrointestinal: Negative.  Negative for abdominal pain, blood in stool, diarrhea, nausea and vomiting.  Genitourinary: Negative.  Negative for dysuria and hematuria.  Musculoskeletal: Negative.  Negative for myalgias.  Skin: Negative.  Negative for rash.  Neurological: Negative for dizziness and headaches.  All other systems reviewed and are negative.   Today's Vitals   03/06/20 0841 03/06/20 0845  BP: (!) 152/77 (!) 150/60  Pulse: 64   Temp: 97.9 F (36.6 C)   TempSrc: Temporal   SpO2: 97%   Weight: 171 lb 3.2 oz (77.7 kg)   Height: 5\' 7"  (1.702 m)    Body mass index is 26.81 kg/m.  Physical Exam Vitals reviewed.  Constitutional:      Appearance: Normal appearance.  HENT:     Head: Normocephalic.  Eyes:     Extraocular Movements: Extraocular movements intact.     Pupils: Pupils are equal, round, and reactive to light.  Cardiovascular:     Rate and Rhythm: Normal rate and  regular rhythm.     Pulses: Normal pulses.     Heart sounds: Normal heart sounds.  Pulmonary:     Effort: Pulmonary effort is normal.     Breath sounds: Normal breath sounds.  Abdominal:     General: There is no distension.     Palpations: Abdomen is soft.     Tenderness: There is no abdominal tenderness.  Musculoskeletal:        General: Normal range of motion.     Cervical back: Normal range of motion and neck supple.  Skin:    General: Skin is warm and dry.  Neurological:     General: No focal deficit present.  Mental Status: He is alert and oriented to person, place, and time.  Psychiatric:        Mood and Affect: Mood normal.        Behavior: Behavior normal.    A total of 30 minutes was spent with the patient, greater than 50% of which was in counseling/coordination of care regarding chronic medical problems and cardiovascular risks associated with these conditions, review of all medications and need for compliance, review of most recent blood work results done last May which were normal, diet and nutrition, review of most recent office visit notes, review of problem list and past medical problems including congestive heart failure, thyroid condition, and listed problem of amyloidosis, prognosis and need for follow-up.   ASSESSMENT & PLAN: Clinically stable.  No medical concerns identified during this visit.  Continue present medications.  No changes. Nonischemic dilated cardiomyopathy Clinically stable.  Stable vital signs.  Acceptable blood pressure reading.  No signs or symptoms of acute CHF.  No signs of volume overload. Continue present medications.  No changes.  Has appointment to see cardiology in the next couple of months. Follow-up in 6 months.  Alireza was seen today for medical management of chronic issues.  Diagnoses and all orders for this visit:  Nonischemic dilated cardiomyopathy ([place])  History of amyloidosis  Chronic systolic heart failure  ([place])  Hypertensive heart disease with chronic systolic congestive heart failure (HCC)  Congestive cardiomyopathy ([place])  Subclinical hyperthyroidism  Monoclonal gammopathy  Amyloidosis, unspecified type [facility])    Patient Instructions  Bleeding Precautions When on Anticoagulant Therapy, Adult Anticoagulant therapy, also called blood thinner therapy, is medicine that helps to prevent and treat blood clots. The medicine works by stopping blood clots from forming or growing. Blood clots that form in your blood vessels can be dangerous. They can break loose and travel to the heart, lungs, or brain. This increases the risk of a heart attack, stroke, or blocked lung artery (pulmonary embolism). Anticoagulants also increase the risk of bleeding. Try to protect yourself from cuts and other injuries that can cause bleeding. It is important to take anticoagulants exactly as told by your health care provider. Why do I need to be on anticoagulant therapy? You may need this medicine if you are at risk of developing a blood clot. Conditions that increase your risk of a blood clot include:  Being born with heart disease or a heart malformation (congenital heart disease).  Developing heart disease.  Having had surgery, such as valve replacement.  Having had a serious accident or other type of severe injury (trauma).  Having certain types of cancer.  Having certain diseases that can increase blood clotting.  Having a high risk of stroke or heart attack.  Having atrial fibrillation (AF). What are the common anticoagulant medicines? There are several types of anticoagulant medicines. The most common types are:  Medicines that you take by mouth (oral medicines), such as: ? Warfarin. ? Novel oral anticoagulants (NOACs), such as:  Direct thrombin inhibitors (dabigatran).  Factor Xa inhibitors (apixaban, edoxaban, and rivaroxaban).  Injections, such as: ? Unfractionated heparin. ? Low  molecular weight heparin. These anticoagulants work in different ways to prevent blood clots. They also have different risks and side effects. What do I need to remember while on anticoagulant therapy? Taking anticoagulants  Take your medicine at the same time every day. If you forget to take your medicine, take it as soon as you remember. Do not double your dosage of medicine if you  miss a whole day. Take your normal dose and call your health care provider.  Do not stop taking your medicine unless your health care provider approves. Stopping the medicine can increase your risk of developing a blood clot. Taking other medicines  Take over-the-counter and prescriptions medicines only as told by your health care provider.  Do not take over-the-counter NSAIDs, including aspirin and ibuprofen, while you are on anticoagulant therapy. These medicines increase your risk of dangerous bleeding.  Get approval from your health care provider before you start taking any new medicines, vitamins, or herbal products. Some of these could interfere with your therapy. General instructions  Keep all follow-up visits as told by your health care provider. This is important.  If you are pregnant or trying to get pregnant, talk with a health care provider about anticoagulants. Some of these medicines are not safe to take during pregnancy.  Tell all health care providers, including your dentist, that you are on anticoagulant therapy. It is especially important to tell providers before you have any surgery, medical procedures, or dental work done. What precautions should I take?   Be very careful when using knives, scissors, or other sharp objects.  Use an electric razor instead of a blade.  Do not use toothpicks.  Use a soft-bristled toothbrush. Brush your teeth gently.  Always wear shoes outdoors and wear slippers indoors.  Be careful when cutting your fingernails and toenails.  Place bath mats in the  bathroom. If possible, install handrails as well.  Wear gloves while you do yard work.  Wear your seat belt.  Prevent falls by removing loose rugs and extension cords from areas where you walk. Use a cane or walker if you need it.  Avoid constipation by: ? Drinking enough fluid to keep your urine clear or pale yellow. ? Eating foods that are high in fiber, such as fresh fruits and vegetables, whole grains, and beans. ? Limiting foods that are high in fat and processed sugars, such as fried and sweet foods.  Do not play contact sports or participate in other activities that have a high risk for injury. What other precautions are important if on warfarin therapy? If you are taking a type of anticoagulant called warfarin, make sure you:  Work with a diet and nutrition specialist (dietitian) to make an eating plan. Do not make any sudden changes to your diet after you have started your eating plan.  Do not drink alcohol. It can interfere with your medicine and increase your risk of an injury that causes bleeding.  Get regular blood tests as told by your health care provider. What are some questions to ask my health care provider?  Why do I need anticoagulant therapy?  What is the best anticoagulant therapy for my condition?  How long will I need anticoagulant therapy?  What are the side effects of anticoagulant therapy?  When should I take my medicine? What should I do if I forget to take it?  Will I need to have regular blood tests?  Do I need to change my diet? Are there foods or drinks that I should avoid?  What activities are safe for me?  What should I do if I want to get pregnant? Contact a health care provider if:  You miss a dose of medicine: ? And you are not sure what to do. ? For more than one day.  You have: ? Menstrual bleeding that is heavier than normal. ? Bloody or brown urine. ?  Easy bruising. ? Black and tarry stool or bright red stool. ? Side effects  from your medicine.  You feel weak or dizzy.  You become pregnant. Get help right away if:  You have bleeding that will not stop within 20 minutes from: ? The nose. ? The gums. ? A cut on the skin.  You have a severe headache or stomachache.  You vomit or cough up blood.  You fall or hit your head. Summary  Anticoagulant therapy, also called blood thinner therapy, is medicine that helps to prevent and treat blood clots.  Anticoagulants work in different ways to prevent blood clots. They also have different risks and side effects.  Talk with your health care provider about any precautions that you should take while on anticoagulant therapy. This information is not intended to replace advice given to you by your health care provider. Make sure you discuss any questions you have with your health care provider. Document Revised: 10/12/2018 Document Reviewed: 09/08/2016 Elsevier Patient Education  [place]. Heart Failure, Self Care Heart failure is a serious condition. This sheet explains things you need to do to take care of yourself at home. To help you stay as healthy as possible, you may be asked to change your diet, take certain medicines, and make other changes in your life. Your doctor may also give you more specific instructions. If you have problems or questions, call your doctor. What are the risks? Having heart failure makes it more likely for you to have some problems. These problems can get worse if you do not take good care of yourself. Problems may include:  Blood clotting problems. This may cause a stroke.  Damage to the kidneys, liver, or lungs.  Abnormal heart rhythms. Supplies needed:  Scale for weighing yourself.  Blood pressure monitor.  Notebook.  Medicines. How to care for yourself when you have heart failure Medicines Take over-the-counter and prescription medicines only as told by your doctor. Take your medicines every day.  Do not stop  taking your medicine unless your doctor tells you to do so.  Do not skip any medicines.  Get your prescriptions refilled before you run out of medicine. This is important. Eating and drinking   Eat heart-healthy foods. Talk with a diet specialist (dietitian) to create an eating plan.  Choose foods that: ? Have no trans fat. ? Are low in saturated fat and cholesterol.  Choose healthy foods, such as: ? Fresh or frozen fruits and vegetables. ? Fish. ? Low-fat (lean) meats. ? Legumes, such as beans, peas, and lentils. ? Fat-free or low-fat dairy products. ? Whole-grain foods. ? High-fiber foods.  Limit salt (sodium) if told by your doctor. Ask your diet specialist to tell you which seasonings are healthy for your heart.  Cook in healthy ways instead of frying. Healthy ways of cooking include roasting, grilling, broiling, baking, poaching, steaming, and stir-frying.  Limit how much fluid you drink, if told by your doctor. Alcohol use  Do not drink alcohol if: ? Your doctor tells you not to drink. ? Your heart was damaged by alcohol, or you have very bad heart failure. ? You are pregnant, may be pregnant, or are planning to become pregnant.  If you drink alcohol: ? Limit how much you use to:  0-1 drink a day for women.  0-2 drinks a day for men. ? Be aware of how much alcohol is in your drink. In the [place] one drink equals one 12 oz bottle of  beer (355 mL), one 5 oz glass of wine (148 mL), or one 1 oz glass of hard liquor (44 mL). Lifestyle   Do not use any products that contain nicotine or tobacco, such as cigarettes, e-cigarettes, and chewing tobacco. If you need help quitting, ask your doctor. ? Do not use nicotine gum or patches before talking to your doctor.  Do not use illegal drugs.  Lose weight if told by your doctor.  Do physical activity if told by your doctor. Talk to your doctor before you begin an exercise if: ? You are an older adult. ? You have very bad  heart failure.  Learn to manage stress. If you need help, ask your doctor.  Get rehab (rehabilitation) to help you stay independent and to help with your quality of life.  Plan time to rest when you get tired. Check weight and blood pressure   Weigh yourself every day. This will help you to know if fluid is building up in your body. ? Weigh yourself every morning after you pee (urinate) and before you eat breakfast. ? Wear the same amount of clothing each time. ? Write down your daily weight. Give your record to your doctor.  Check and write down your blood pressure as told by your doctor.  Check your pulse as told by your doctor. Dealing with very hot and very cold weather  If it is very hot: ? Avoid activities that take a lot of energy. ? Use air conditioning or fans, or find a cooler place. ? Avoid caffeine and alcohol. ? Wear clothing that is loose-fitting, lightweight, and light-colored.  If it is very cold: ? Avoid activities that take a lot of energy. ? Layer your clothes. ? Wear mittens or gloves, a hat, and a scarf when you go outside. ? Avoid alcohol. Follow these instructions at home:  Stay up to date with shots (vaccines). Get pneumococcal and flu (influenza) shots.  Keep all follow-up visits as told by your doctor. This is important. Contact a doctor if:  You gain weight quickly.  You have increasing shortness of breath.  You cannot do your normal activities.  You get tired easily.  You cough a lot.  You don't feel like eating or feel like you may vomit (nauseous).  You become puffy (swell) in your [place], feet, ankles, or belly (abdomen).  You cannot sleep well because it is hard to breathe.  You feel like your heart is beating fast (palpitations).  You get dizzy when you stand up. Get help right away if:  You have trouble breathing.  You or someone else notices a change in your behavior, such as having trouble staying awake.  You have chest  pain or discomfort.  You pass out (faint). These symptoms may be an emergency. Do not wait to see if the symptoms will go away. Get medical help right away. Call your local emergency services (911 in the [place] Do not drive yourself to the hospital. Summary  Heart failure is a serious condition. To care for yourself, you may have to change your diet, take medicines, and make other lifestyle changes.  Take your medicines every day. Do not stop taking them unless your doctor tells you to do so.  Eat heart-healthy foods, such as fresh or frozen fruits and vegetables, fish, lean meats, legumes, fat-free or low-fat dairy products, and whole-grain or high-fiber foods.  Ask your doctor if you can drink alcohol. You may have to stop alcohol use if you  have very bad heart failure.  Contact your doctor if you gain weight quickly or feel that your heart is beating too fast. Get help right away if you pass out, or have chest pain or trouble breathing. This information is not intended to replace advice given to you by your health care provider. Make sure you discuss any questions you have with your health care provider. Document Revised: 10/04/2018 Document Reviewed: 10/05/2018 Elsevier Patient Education  2020 Elsevier Inc.     Agustina Caroli, MD [facility]

## 2020-03-06 NOTE — Assessment & Plan Note (Signed)
Clinically stable.  Stable vital signs.  Acceptable blood pressure reading.  No signs or symptoms of acute CHF.  No signs of volume overload. Continue present medications.  No changes.  Has appointment to see cardiology in the next couple of months. Follow-up in 6 months.

## 2020-03-21 ENCOUNTER — Other Ambulatory Visit: Payer: Self-pay | Admitting: Emergency Medicine

## 2020-03-21 DIAGNOSIS — R399 Unspecified symptoms and signs involving the genitourinary system: Secondary | ICD-10-CM

## 2020-04-17 ENCOUNTER — Ambulatory Visit: Payer: Medicare HMO | Admitting: Internal Medicine

## 2020-05-15 ENCOUNTER — Other Ambulatory Visit: Payer: Self-pay | Admitting: Internal Medicine

## 2020-06-04 ENCOUNTER — Telehealth: Payer: Self-pay | Admitting: *Deleted

## 2020-06-04 NOTE — Telephone Encounter (Signed)
Schedule AWV.  

## 2020-06-07 ENCOUNTER — Encounter: Payer: Self-pay | Admitting: Registered Nurse

## 2020-06-07 ENCOUNTER — Other Ambulatory Visit: Payer: Self-pay

## 2020-06-07 ENCOUNTER — Ambulatory Visit (INDEPENDENT_AMBULATORY_CARE_PROVIDER_SITE_OTHER): Payer: Medicare Other | Admitting: Registered Nurse

## 2020-06-07 VITALS — BP 138/66 | HR 80 | Temp 98.0°F | Resp 18 | Ht 67.0 in | Wt 167.8 lb

## 2020-06-07 DIAGNOSIS — R21 Rash and other nonspecific skin eruption: Secondary | ICD-10-CM | POA: Diagnosis not present

## 2020-06-07 DIAGNOSIS — I872 Venous insufficiency (chronic) (peripheral): Secondary | ICD-10-CM | POA: Diagnosis not present

## 2020-06-07 MED ORDER — DOXYCYCLINE HYCLATE 100 MG PO TABS: 100.0000 mg | ORAL_TABLET | Freq: Two times a day (BID) | ORAL | 0 refills | Status: DC

## 2020-06-07 MED ORDER — TRIAMCINOLONE ACETONIDE 0.1 % EX CREA: 1.0000 "application " | TOPICAL_CREAM | Freq: Two times a day (BID) | CUTANEOUS | 0 refills | Status: DC

## 2020-06-07 NOTE — Patient Instructions (Signed)
° ° ° °  If you have lab work done today you will be contacted with your lab results within the next 2 weeks.  If you have not heard from us then please contact us. The fastest way to get your results is to register for My Chart. ° ° °IF you received an x-ray today, you will receive an invoice from [facility]. Please contact [facility] at [phone] with questions or concerns regarding your invoice.  ° °IF you received labwork today, you will receive an invoice from [facility]. Please contact [facility] at [phone] with questions or concerns regarding your invoice.  ° °Our billing staff will not be able to assist you with questions regarding bills from these companies. ° °You will be contacted with the lab results as soon as they are available. The fastest way to get your results is to activate your My Chart account. Instructions are located on the last page of this paperwork. If you have not heard from us regarding the results in 2 weeks, please contact this office. °  ° ° ° °

## 2020-06-07 NOTE — Progress Notes (Signed)
Acute Office Visit  Subjective:    Patient ID: Bryan Lamb, male    DOB: April 07, 1946, 74 y.o.   MRN: [id number]  Chief Complaint  Patient presents with   Rash    Patient has noticed a rash on both legs for a week. Per patient patient he has used some cream today only so he can not tell if its working,    HPI Patient is in today for rash  On arms and legs  Arms: thickened, scaling skin on dorsal hands and both forearms. R > L. R elbow dorsal surface affected. No cracking. Some itching. No drainage or ulcerations. Has not happened before.  Legs: ble. Flaking, thin skin. Ulcerations. Similar to rash 2-3 years ago tx here. Notes hx of dvt but not having swelling, redness, or heat. Does have hx of venous stasis.  No systemic issues. No new meds or exposures.  Past Medical History:  Diagnosis Date   CHF (congestive heart failure) (HCC)    Chronic systolic dysfunction of left ventricle    Nonischemic cardiomyopathy (HCC)    NSVT (nonsustained ventricular tachycardia) (HCC)    Urinary incontinence     Past Surgical History:  Procedure Laterality Date   ANKLE FRACTURE SURGERY     KNEE SURGERY     LEFT HEART CATHETERIZATION WITH CORONARY ANGIOGRAM N/A 09/04/2014   Procedure: LEFT HEART CATHETERIZATION WITH CORONARY ANGIOGRAM;  Surgeon: Sanda Klein, MD;  Location: [facility];  Service: Cardiovascular;  Laterality: N/A;    Family History  Problem Relation Age of Onset   Heart disease Other    Stroke Other    Cancer Sister        breast cancer    Social History   Socioeconomic History   Marital status: Married    Spouse name: Visua   Number of children: 2   Years of education: 10th grade   Highest education level: Not on file  Occupational History   Occupation: retired     Comment: Chief Technology Officer, mill work, [employer]  Tobacco Use   Smoking status: Heavy Tobacco Smoker    Packs/day: 1.00    Years: 52.00    Pack years: 52.00    Smokeless tobacco: Never Used  Scientific laboratory technician Use: Never used  Substance and Sexual Activity   Alcohol use: No    Alcohol/week: 0.0 standard drinks    Comment: remote heavy ETOH, none for years   Drug use: No   Sexual activity: Not on file  Other Topics Concern   Not on file  Social History Narrative   Pt lives in [place] with spouse.  Previously worked at [employer].  Retired.  2 grown daughters are healthy.   Social Determinants of Health   Financial Resource Strain:    Difficulty of Paying Living Expenses: Not on file  Food Insecurity:    Worried About Charity fundraiser in the Last Year: Not on file   [employer] of Food in the Last Year: Not on file  Transportation Needs:    Lack of Transportation (Medical): Not on file   Lack of Transportation (Non-Medical): Not on file  Physical Activity:    Days of Exercise per Week: Not on file   Minutes of Exercise per Session: Not on file  Stress:    Feeling of Stress : Not on file  Social Connections:    Frequency of Communication with Friends and Family: Not on file   Frequency of Social Gatherings with  Friends and Family: Not on file   Attends Religious Services: Not on file   Active Member of Clubs or Organizations: Not on file   Attends Archivist Meetings: Not on file   Marital Status: Not on file  Intimate Partner Violence:    Fear of Current or Ex-Partner: Not on file   Emotionally Abused: Not on file   Physically Abused: Not on file   Sexually Abused: Not on file    Outpatient Medications Prior to Visit  Medication Sig Dispense Refill   finasteride (PROSCAR) 5 MG tablet TAKE 1 TABLET(5 MG) BY MOUTH DAILY 90 tablet 3   methimazole (TAPAZOLE) 5 MG tablet TAKE 1 TABLET(5 MG) BY MOUTH DAILY 90 tablet 0   nitroGLYCERIN (NITROSTAT) 0.4 MG SL tablet Place 1 tablet (0.4 mg total) under the tongue every 5 (five) minutes x 3 doses as needed for chest pain. 25 tablet 12    XARELTO 20 MG TABS tablet TAKE 1 TABLET BY MOUTH ONCE DAILY WITH SUPPER 90 tablet 2   carvedilol (COREG) 12.5 MG tablet TAKE 1 AND 1/2 TABLET BY MOUTH TWICE DAILY WITH MEALS. 270 tablet 3   No facility-administered medications prior to visit.    No Known Allergies  Review of Systems  Constitutional: Negative.   HENT: Negative.   Eyes: Negative.   Respiratory: Negative.   Cardiovascular: Negative.   Gastrointestinal: Negative.   Endocrine: Negative.   Genitourinary: Negative.   Musculoskeletal: Negative.   Skin: Positive for rash. Negative for color change, pallor and wound.  Allergic/Immunologic: Negative.   Neurological: Negative.   Hematological: Negative.   Psychiatric/Behavioral: Negative.   All other systems reviewed and are negative.      Objective:    Physical Exam Vitals and nursing note reviewed.  Constitutional:      Appearance: Normal appearance.  Cardiovascular:     Rate and Rhythm: Normal rate and regular rhythm.  Musculoskeletal:        General: No swelling, tenderness, deformity or signs of injury. Normal range of motion.     Right lower leg: No edema.     Left lower leg: No edema.  Skin:    General: Skin is warm and dry.     Capillary Refill: Capillary refill takes less than 2 seconds.     Coloration: Skin is not jaundiced or pale.     Findings: Rash and wound (ble, apparent venous stasis ulcer) present. No bruising, erythema or lesion. Rash is scaling (bilateral arms).  Neurological:     General: No focal deficit present.     Mental Status: He is alert and oriented to person, place, and time. Mental status is at baseline.  Psychiatric:        Mood and Affect: Mood normal.        Behavior: Behavior normal.        Thought Content: Thought content normal.        Judgment: Judgment normal.     BP 138/66    Pulse 80    Temp 98 F (36.7 C) (Temporal)    Resp 18    Ht $R'5\' 7"'oF$  (1.702 m)    Wt 167 lb 12.8 oz (76.1 kg)    SpO2 98%    BMI 26.28 kg/m  Wt  Readings from Last 3 Encounters:  06/07/20 167 lb 12.8 oz (76.1 kg)  03/06/20 171 lb 3.2 oz (77.7 kg)  11/14/19 175 lb (79.4 kg)    There are no preventive care reminders to display  for this patient.  There are no preventive care reminders to display for this patient.   Lab Results  Component Value Date   TSH 1.040 09/06/2019   Lab Results  Component Value Date   WBC 4.2 (A) 11/14/2019   HGB 11.4 11/14/2019   HCT 36.6 11/14/2019   MCV 89.1 11/14/2019   PLT 165 09/06/2019   Lab Results  Component Value Date   NA 141 11/14/2019   K 4.3 11/14/2019   CHLORIDE 108 01/14/2015   CO2 22 11/14/2019   GLUCOSE 88 11/14/2019   BUN 10 11/14/2019   CREATININE 0.96 11/14/2019   BILITOT 0.6 11/14/2019   ALKPHOS 85 11/14/2019   AST 14 11/14/2019   ALT 8 11/14/2019   PROT 6.9 11/14/2019   ALBUMIN 3.9 11/14/2019   CALCIUM 8.9 11/14/2019   ANIONGAP 6 04/15/2018   EGFR 68 (L) 01/14/2015   Lab Results  Component Value Date   CHOL 160 09/06/2019   Lab Results  Component Value Date   HDL 60 09/06/2019   Lab Results  Component Value Date   LDLCALC 90 09/06/2019   Lab Results  Component Value Date   TRIG 49 09/06/2019   Lab Results  Component Value Date   CHOLHDL 2.7 09/06/2019   Lab Results  Component Value Date   HGBA1C 6.0 (H) 09/04/2014       Assessment & Plan:   Problem List Items Addressed This Visit    None    Visit Diagnoses    Venous stasis dermatitis of both lower extremities    -  Primary   Relevant Medications   doxycycline (VIBRA-TABS) 100 MG tablet   Rash and nonspecific skin eruption       Relevant Medications   triamcinolone (KENALOG) 0.1 %       Meds ordered this encounter  Medications   doxycycline (VIBRA-TABS) 100 MG tablet    Sig: Take 1 tablet (100 mg total) by mouth 2 (two) times daily.    Dispense:  20 tablet    Refill:  0    Order Specific Question:   Supervising Provider    Answer:   Carlota Raspberry, JEFFREY R [[id number]]   triamcinolone  (KENALOG) 0.1 %    Sig: Apply 1 application topically 2 (two) times daily.    Dispense:  30 g    Refill:  0    Order Specific Question:   Supervising Provider    Answer:   Carlota Raspberry, JEFFREY R [[id number]]   PLAN  Appears as two different etiologies. Concern for inflammatory condition on skin of arms. Legs look more like venous stasis ulcers, which he has had in the past.  Will give one week course of doxycycline for venous stasis, kenalog for arms. Monitor for changes.   Return prn  Patient encouraged to call clinic with any questions, comments, or concerns.   Maximiano Coss, NP

## 2020-06-19 ENCOUNTER — Other Ambulatory Visit: Payer: Self-pay | Admitting: Internal Medicine

## 2020-07-19 ENCOUNTER — Encounter: Payer: Self-pay | Admitting: Family Medicine

## 2020-07-19 ENCOUNTER — Other Ambulatory Visit: Payer: Self-pay

## 2020-07-19 ENCOUNTER — Ambulatory Visit (INDEPENDENT_AMBULATORY_CARE_PROVIDER_SITE_OTHER): Payer: Medicare Other | Admitting: Family Medicine

## 2020-07-19 VITALS — BP 162/74 | HR 71 | Temp 98.5°F | Resp 15 | Ht 67.0 in | Wt 167.2 lb

## 2020-07-19 DIAGNOSIS — R21 Rash and other nonspecific skin eruption: Secondary | ICD-10-CM

## 2020-07-19 MED ORDER — TRIAMCINOLONE ACETONIDE 0.1 % EX LOTN: TOPICAL_LOTION | CUTANEOUS | 2 refills | Status: DC

## 2020-07-19 NOTE — Patient Instructions (Addendum)
Looking at this rash it appears that you have had it much longer than you realize.  I think you need to see a skin specialist (dermatologist) to get this evaluated better.  Someone should call you during the next week to let you know where the appointment will be.  I do not know how long it will take to get in with the skin specialist.  Use triamcinolone lotion on to the areas that are irritating you twice a day.  Use Alpha Keri (or something similar that the pharmacist can help you find) skin lotion that you can buy over-the-counter at the pharmacy.  Rub it on the affected skin twice a day.  Return as needed  If you have lab work done today you will be contacted with your lab results within the next 2 weeks.  If you have not heard from [place] then please contact us. The fastest way to get your results is to register for My Chart.   IF you received an x-ray today, you will receive an invoice from [facility]. Please contact [facility] at [phone] with questions or concerns regarding your invoice.   IF you received labwork today, you will receive an invoice from [facility]. Please contact [facility] at [phone] with questions or concerns regarding your invoice.   Our billing staff will not be able to assist you with questions regarding bills from these companies.  You will be contacted with the lab results as soon as they are available. The fastest way to get your results is to activate your My Chart account. Instructions are located on the last page of this paperwork. If you have not heard from [place] regarding the results in 2 weeks, please contact this office.

## 2020-07-19 NOTE — Progress Notes (Signed)
Patient ID: Bryan Lamb, male    DOB: 08-May-1946  Age: 75 y.o. MRN: [id number]  Chief Complaint  Patient presents with  . Rash    Pt here for bilateral lower arm rash looks very dry and cracked, notes itchy sometimes has been there about 1 week, pt tried dermaseal and hasnt helped much but some     Subjective:   Patient is here with a rash that he is says he has had for a week.  Does not itch much.  He wondered whether it was poison ivy.  He wondered whether it was "sugar".  I reassured him that last year the diabetes test were all good on him.  Current allergies, medications, problem list, past/family and social histories reviewed.  Objective:  BP (!) 162/74   Pulse 71   Temp 98.5 F (36.9 C) (Temporal)   Resp 15   Ht 5\' 7"  (1.702 m)   Wt 167 lb 3.2 oz (75.8 kg)   SpO2 99%   BMI 26.19 kg/m  No major acute distress.  Hard of hearing.  He has a crusted thick dermatitis on his arms and legs and trunk.  Looks old and chronic.  Assessment & Plan:   Assessment: 1. Rash and nonspecific skin eruption       Plan: Probably some sort of a chronic eczema, but I am asking him to see a dermatologist since I do not know what it is.  Orders Placed This Encounter  Procedures  . CBC  . Sedimentation rate  . Comprehensive metabolic panel  . Ambulatory referral to Dermatology    Referral Priority:   Routine    Referral Type:   Consultation    Referral Reason:   Specialty Services Required    Requested Specialty:   Dermatology    Number of Visits Requested:   1    Meds ordered this encounter  Medications  . triamcinolone lotion (KENALOG) 0.1 %    Sig: Apply a small amount twice daily to areas of skin irritation.    Dispense:  120 mL    Refill:  2         Patient Instructions   Looking at this rash it appears that you have had it much longer than you realize.  I think you need to see a skin specialist (dermatologist) to get this evaluated better.  Someone should call you  during the next week to let you know where the appointment will be.  I do not know how long it will take to get in with the skin specialist.  Use triamcinolone lotion on to the areas that are irritating you twice a day.  Use Alpha Keri (or something similar that the pharmacist can help you find) skin lotion that you can buy over-the-counter at the pharmacy.  Rub it on the affected skin twice a day.  Return as needed  If you have lab work done today you will be contacted with your lab results within the next 2 weeks.  If you have not heard from [place] then please contact us. The fastest way to get your results is to register for My Chart.   IF you received an x-ray today, you will receive an invoice from [facility]. Please contact [facility] at [phone] with questions or concerns regarding your invoice.   IF you received labwork today, you will receive an invoice from [facility]. Please contact [facility] at [phone] with questions or concerns regarding your invoice.   Our billing staff will  not be able to assist you with questions regarding bills from these companies.  You will be contacted with the lab results as soon as they are available. The fastest way to get your results is to activate your My Chart account. Instructions are located on the last page of this paperwork. If you have not heard from [place] regarding the results in 2 weeks, please contact this office.        Return if symptoms worsen or fail to improve.   Ruben Reason, MD 07/19/2020

## 2020-07-20 LAB — COMPREHENSIVE METABOLIC PANEL
ALT: 10 IU/L (ref 0–44)
AST: 15 IU/L (ref 0–40)
Albumin/Globulin Ratio: 1.1 — ABNORMAL LOW (ref 1.2–2.2)
Albumin: 3.6 g/dL — ABNORMAL LOW (ref 3.7–4.7)
Alkaline Phosphatase: 73 IU/L (ref 44–121)
BUN/Creatinine Ratio: 11 (ref 10–24)
BUN: 11 mg/dL (ref 8–27)
Bilirubin Total: 0.5 mg/dL (ref 0.0–1.2)
CO2: 22 mmol/L (ref 20–29)
Calcium: 8.7 mg/dL (ref 8.6–10.2)
Chloride: 109 mmol/L — ABNORMAL HIGH (ref 96–106)
Creatinine, Ser: 0.99 mg/dL (ref 0.76–1.27)
GFR calc Af Amer: 86 mL/min/{1.73_m2} (ref >59)
GFR calc non Af Amer: 74 mL/min/{1.73_m2} (ref >59)
Globulin, Total: 3.4 g/dL (ref 1.5–4.5)
Glucose: 88 mg/dL (ref 65–99)
Potassium: 4.3 mmol/L (ref 3.5–5.2)
Sodium: 143 mmol/L (ref 134–144)
Total Protein: 7 g/dL (ref 6.0–8.5)

## 2020-07-20 LAB — CBC
Hematocrit: 32.4 % — ABNORMAL LOW (ref 37.5–51.0)
Hemoglobin: 10.5 g/dL — ABNORMAL LOW (ref 13.0–17.7)
MCH: 28 pg (ref 26.6–33.0)
MCHC: 32.4 g/dL (ref 31.5–35.7)
MCV: 86 fL (ref 79–97)
Platelets: 185 10*3/uL (ref 150–450)
RBC: 3.75 x10E6/uL — ABNORMAL LOW (ref 4.14–5.80)
RDW: 14.4 % (ref 11.6–15.4)
WBC: 5.1 10*3/uL (ref 3.4–10.8)

## 2020-07-20 LAB — SEDIMENTATION RATE: Sed Rate: 29 mm/hr (ref 0–30)

## 2020-08-18 ENCOUNTER — Other Ambulatory Visit: Payer: Self-pay | Admitting: Emergency Medicine

## 2020-08-18 DIAGNOSIS — I420 Dilated cardiomyopathy: Secondary | ICD-10-CM

## 2020-08-18 NOTE — Telephone Encounter (Signed)
Approved per protocol.  Requested Prescriptions  Pending Prescriptions Disp Refills  . carvedilol (COREG) 12.5 MG tablet [Pharmacy Med Name: CARVEDILOL 12.5MG  TABLETS] 270 tablet 3    Sig: TAKE 1 AND 1/2 TABLETS BY MOUTH TWICE DAILY WITH MEALS     Cardiovascular:  Beta Blockers Failed - 08/18/2020  7:33 AM      Failed - Last BP in normal range    BP Readings from Last 1 Encounters:  07/19/20 (!) 162/74         Passed - Last Heart Rate in normal range    Pulse Readings from Last 1 Encounters:  07/19/20 71         Passed - Valid encounter within last 6 months    Recent Outpatient Visits          1 month ago Rash and nonspecific skin eruption   Primary Care at [facility], Fenton Malling, MD   2 months ago Venous stasis dermatitis of both lower extremities   [facility] at [facility], [place], NP   5 months ago 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

## 2020-09-03 ENCOUNTER — Ambulatory Visit: Payer: Medicare HMO | Admitting: Emergency Medicine

## 2020-11-16 ENCOUNTER — Other Ambulatory Visit: Payer: Self-pay | Admitting: Emergency Medicine

## 2020-12-16 ENCOUNTER — Other Ambulatory Visit: Payer: Self-pay | Admitting: Emergency Medicine

## 2020-12-16 DIAGNOSIS — R399 Unspecified symptoms and signs involving the genitourinary system: Secondary | ICD-10-CM

## 2021-08-14 ENCOUNTER — Other Ambulatory Visit: Payer: Self-pay | Admitting: Emergency Medicine

## 2021-08-14 DIAGNOSIS — I420 Dilated cardiomyopathy: Secondary | ICD-10-CM

## 2021-08-19 ENCOUNTER — Telehealth: Payer: Self-pay | Admitting: Emergency Medicine

## 2021-08-20 NOTE — Telephone Encounter (Signed)
Pts spouse states pt has 1 dose of  XARELTO 20 MG TABS tablet left, spouse requesting a short supply until pts next ov on 08-25-2021

## 2021-08-21 ENCOUNTER — Other Ambulatory Visit: Payer: Self-pay

## 2021-08-21 MED ORDER — RIVAROXABAN 20 MG PO TABS: ORAL_TABLET | ORAL | 2 refills | Status: AC

## 2021-08-21 NOTE — Progress Notes (Signed)
Refilled medication

## 2021-08-22 NOTE — Telephone Encounter (Signed)
Called, left a vm that medication was sent to pharmacy.

## 2021-08-25 ENCOUNTER — Other Ambulatory Visit: Payer: Self-pay

## 2021-08-25 ENCOUNTER — Encounter: Payer: Self-pay | Admitting: Emergency Medicine

## 2021-08-25 ENCOUNTER — Ambulatory Visit (INDEPENDENT_AMBULATORY_CARE_PROVIDER_SITE_OTHER): Payer: Medicare Other | Admitting: Emergency Medicine

## 2021-08-25 VITALS — BP 134/78 | HR 59 | Temp 98.2°F | Ht 67.0 in | Wt 162.0 lb

## 2021-08-25 DIAGNOSIS — E859 Amyloidosis, unspecified: Secondary | ICD-10-CM

## 2021-08-25 DIAGNOSIS — I10 Essential (primary) hypertension: Secondary | ICD-10-CM

## 2021-08-25 DIAGNOSIS — E0590 Thyrotoxicosis, unspecified without thyrotoxic crisis or storm: Secondary | ICD-10-CM

## 2021-08-25 DIAGNOSIS — E0520 Thyrotoxicosis with toxic multinodular goiter without thyrotoxic crisis or storm: Secondary | ICD-10-CM

## 2021-08-25 DIAGNOSIS — R399 Unspecified symptoms and signs involving the genitourinary system: Secondary | ICD-10-CM

## 2021-08-25 DIAGNOSIS — D472 Monoclonal gammopathy: Secondary | ICD-10-CM | POA: Diagnosis not present

## 2021-08-25 DIAGNOSIS — I420 Dilated cardiomyopathy: Secondary | ICD-10-CM

## 2021-08-25 DIAGNOSIS — Z720 Tobacco use: Secondary | ICD-10-CM

## 2021-08-25 DIAGNOSIS — Z7901 Long term (current) use of anticoagulants: Secondary | ICD-10-CM

## 2021-08-25 LAB — LIPID PANEL
Cholesterol: 134 mg/dL (ref 0–200)
HDL: 50.7 mg/dL (ref >39.00)
LDL Cholesterol: 75 mg/dL (ref 0–99)
NonHDL: 83.43
Total CHOL/HDL Ratio: 3
Triglycerides: 43 mg/dL (ref 0.0–149.0)
VLDL: 8.6 mg/dL (ref 0.0–40.0)

## 2021-08-25 LAB — COMPREHENSIVE METABOLIC PANEL
ALT: 7 U/L (ref 0–53)
AST: 13 U/L (ref 0–37)
Albumin: 3.6 g/dL (ref 3.5–5.2)
Alkaline Phosphatase: 67 U/L (ref 39–117)
BUN: 12 mg/dL (ref 6–23)
CO2: 31 mEq/L (ref 19–32)
Calcium: 8.9 mg/dL (ref 8.4–10.5)
Chloride: 107 mEq/L (ref 96–112)
Creatinine, Ser: 0.94 mg/dL (ref 0.40–1.50)
GFR: 78.57 mL/min (ref >60.00)
Glucose, Bld: 98 mg/dL (ref 70–99)
Potassium: 4.3 mEq/L (ref 3.5–5.1)
Sodium: 139 mEq/L (ref 135–145)
Total Bilirubin: 0.8 mg/dL (ref 0.2–1.2)
Total Protein: 7.3 g/dL (ref 6.0–8.3)

## 2021-08-25 LAB — THYROID PANEL WITH TSH
Free Thyroxine Index: 2.4 (ref 1.4–3.8)
T3 Uptake: 30 % (ref 22–35)
T4, Total: 8 ug/dL (ref 4.9–10.5)
TSH: 0.04 mIU/L — ABNORMAL LOW (ref 0.40–4.50)

## 2021-08-25 LAB — HEMOGLOBIN A1C: Hgb A1c MFr Bld: 6 % (ref 4.6–6.5)

## 2021-08-25 NOTE — Assessment & Plan Note (Signed)
Much better on finasteride.  Stable.

## 2021-08-25 NOTE — Progress Notes (Signed)
Bryan Lamb 76 y.o.   Chief Complaint  Patient presents with   Hypertension    F/U, chronic conditions    HISTORY OF PRESENT ILLNESS: This is a 76 y.o. male here for 33-month follow-up of chronic medical problems. Stable.  Has no complaints or medical concerns today. BP Readings from Last 3 Encounters:  07/19/20 (!) 162/74  06/07/20 138/66  03/06/20 (!) 150/60     Hypertension Pertinent negatives include no chest pain, headaches, palpitations or shortness of breath.  Medication Refill Pertinent negatives include no abdominal pain, chest pain, chills, congestion, coughing, fever, headaches, nausea, rash, sore throat or vomiting.    Prior to Admission medications   Medication Sig Start Date End Date Taking? Authorizing Provider  carvedilol (COREG) 12.5 MG tablet Take 1.5 tablets (18.75 mg total) by mouth 2 (two) times daily with a meal. TAKE 1 AND 1/2 TABLETS BY MOUTH TWICE DAILY WITH MEALS 08/14/21 11/12/21  Horald Pollen, MD  finasteride (PROSCAR) 5 MG tablet TAKE 1 TABLET(5 MG) BY MOUTH DAILY 12/17/20   Horald Pollen, MD  methimazole (TAPAZOLE) 5 MG tablet TAKE 1 TABLET(5 MG) BY MOUTH DAILY 05/15/20   Philemon Kingdom, MD  nitroGLYCERIN (NITROSTAT) 0.4 MG SL tablet Place 1 tablet (0.4 mg total) under the tongue every 5 (five) minutes x 3 doses as needed for chest pain. 09/08/18   Horald Pollen, MD  rivaroxaban (XARELTO) 20 MG TABS tablet TAKE 1 TABLET BY MOUTH EVERY DAY WITH SUPPER 08/21/21   Horald Pollen, MD  triamcinolone lotion (KENALOG) 0.1 % Apply a small amount twice daily to areas of skin irritation. 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   Tobacco abuse 09/04/2014   Dyslipidemia 09/04/2014   Lower urinary tract symptoms     Past Medical History:  Diagnosis Date   CHF (congestive heart failure) (HCC)    Chronic systolic dysfunction of left ventricle    Nonischemic cardiomyopathy (HCC)    NSVT (nonsustained ventricular tachycardia) (HCC)    Urinary incontinence     Past Surgical History:  Procedure Laterality Date   ANKLE FRACTURE SURGERY     KNEE SURGERY     LEFT HEART CATHETERIZATION WITH CORONARY ANGIOGRAM N/A 09/04/2014   Procedure: LEFT HEART CATHETERIZATION WITH CORONARY ANGIOGRAM;  Surgeon: Sanda Klein, MD;  Location: [facility];  Service: Cardiovascular;  Laterality: N/A;    Social History   Socioeconomic History   Marital status: Married    Spouse name: Visua   Number of children: 2   Years of education: 10th grade   Highest education level: Not on file  Occupational History   Occupation: retired     Comment: Chief Technology Officer, mill work, [employer]  Tobacco Use   Smoking status: Heavy Smoker    Packs/day: 1.00    Years: 52.00    Pack years: 52.00    Types: Cigarettes   Smokeless tobacco: Never  Vaping Use   Vaping Use: Never used  Substance and Sexual Activity   Alcohol use: No    Alcohol/week: 0.0 standard drinks    Comment: remote heavy ETOH, none for years   Drug use: No  Sexual activity: Not on file  Other Topics Concern   Not on file  Social History Narrative   Pt lives in [place] with spouse.  Previously worked at [employer].  Retired.  2 grown daughters are healthy.   Social Determinants of Health   Financial Resource Strain: Not on file  Food Insecurity: Not on file  Transportation Needs: Not on file  Physical Activity: Not on file  Stress: Not on file  Social Connections: Not on file  Intimate Partner Violence: Not on file    Family History  Problem  Relation Age of Onset   Heart disease Other    Stroke Other    Cancer Sister        breast cancer     Review of Systems  Constitutional: Negative.  Negative for chills and fever.  HENT: Negative.  Negative for congestion, nosebleeds and sore throat.   Respiratory: Negative.  Negative for cough, hemoptysis and shortness of breath.   Cardiovascular: Negative.  Negative for chest pain, palpitations and leg swelling.  Gastrointestinal:  Negative for abdominal pain, blood in stool, diarrhea, melena, nausea and vomiting.  Genitourinary: Negative.  Negative for hematuria.  Skin: Negative.  Negative for rash.  Neurological: Negative.  Negative for dizziness and headaches.  All other systems reviewed and are negative.  Today's Vitals   08/25/21 0812 08/25/21 0819  BP: 140/68 134/78  Pulse: (!) 59   Temp: 98.2 F (36.8 C)   TempSrc: Oral   SpO2: 96%   Weight: 162 lb (73.5 kg)   Height: 5\' 7"  (1.702 m)    Body mass index is 25.37 kg/m.  Physical Exam Vitals reviewed.  Constitutional:      Appearance: Normal appearance.  HENT:     Head: Normocephalic.  Eyes:     Extraocular Movements: Extraocular movements intact.     Conjunctiva/sclera: Conjunctivae normal.     Pupils: Pupils are equal, round, and reactive to light.  Cardiovascular:     Rate and Rhythm: Normal rate and regular rhythm.     Pulses: Normal pulses.     Heart sounds: Normal heart sounds.  Pulmonary:     Effort: Pulmonary effort is normal.     Breath sounds: Normal breath sounds.  Abdominal:     Palpations: Abdomen is soft.     Tenderness: There is no abdominal tenderness.  Musculoskeletal:     Cervical back: No tenderness.     Right lower leg: No edema.     Left lower leg: No edema.  Lymphadenopathy:     Cervical: No cervical adenopathy.  Skin:    General: Skin is warm and dry.     Capillary Refill: Capillary refill takes less than 2 seconds.  Neurological:     General: No focal deficit present.      Mental Status: He is alert and oriented to person, place, and time.  Psychiatric:        Mood and Affect: Mood normal.        Behavior: Behavior normal.     ASSESSMENT & PLAN: Problem List Items Addressed This Visit       Cardiovascular and Mediastinum   Nonischemic dilated cardiomyopathy ([place])    Stable.  Clinically euvolemic.  No signs or symptoms of acute CHF.      Hypertension - Primary    Well-controlled hypertension.  Continue carvedilol 18.75 mg daily.      Relevant Orders   Hemoglobin A1c   Comprehensive metabolic panel   Lipid  panel     Endocrine   Subclinical hyperthyroidism    Clinically euthyroid.  On no medications.      Relevant Orders   Thyroid Panel With TSH   Toxic multinodul goiter     Other   Lower urinary tract symptoms    Much better on finasteride.  Stable.      Tobacco abuse    Cardiovascular and cancer risk associated with smoking discussed.  Smoking cessation advice given.      Chronic anticoagulation    No clinical concerns for bleeding.  Continue Xarelto 20 mg daily.  Fall precautions given.      Amyloidosis ([place])   Monoclonal gammopathy   Patient Instructions  Hypertension, Adult High blood pressure (hypertension) is when the force of blood pumping through the arteries is too strong. The arteries are the blood vessels that carry blood from the heart throughout the body. Hypertension forces the heart to work harder to pump blood and may cause arteries to become narrow or stiff. Untreated or uncontrolled hypertension can cause a heart attack, heart failure, a stroke, kidney disease, and other problems. A blood pressure reading consists of a higher number over a lower number. Ideally, your blood pressure should be below 120/80. The first ("top") number is called the systolic pressure. It is a measure of the pressure in your arteries as your heart beats. The second ("bottom") number is called the diastolic pressure. It is a measure of the  pressure in your arteries as the heart relaxes. What are the causes? The exact cause of this condition is not known. There are some conditions that result in or are related to high blood pressure. What increases the risk? Some risk factors for high blood pressure are under your control. The following factors may make you more likely to develop this condition: Smoking. Having type 2 diabetes mellitus, high cholesterol, or both. Not getting enough exercise or physical activity. Being overweight. Having too much fat, sugar, calories, or salt (sodium) in your diet. Drinking too much alcohol. Some risk factors for high blood pressure may be difficult or impossible to change. Some of these factors include: Having chronic kidney disease. Having a family history of high blood pressure. Age. Risk increases with age. Race. You may be at higher risk if you are African American. Gender. Men are at higher risk than women before age 42. After age 33, women are at higher risk than men. Having obstructive sleep apnea. Stress. What are the signs or symptoms? High blood pressure may not cause symptoms. Very high blood pressure (hypertensive crisis) may cause: Headache. Anxiety. Shortness of breath. Nosebleed. Nausea and vomiting. Vision changes. Severe chest pain. Seizures. How is this diagnosed? This condition is diagnosed by measuring your blood pressure while you are seated, with your arm resting on a flat surface, your legs uncrossed, and your feet flat on the floor. The cuff of the blood pressure monitor will be placed directly against the skin of your upper arm at the level of your heart. It should be measured at least twice using the same arm. Certain conditions can cause a difference in blood pressure between your right and left arms. Certain factors can cause blood pressure readings to be lower or higher than normal for a short period of time: When your blood pressure is higher when you are in  a health care provider's office than when you are at home, this is called white coat hypertension. Most people with this condition do not need  medicines. When your blood pressure is higher at home than when you are in a health care provider's office, this is called masked hypertension. Most people with this condition may need medicines to control blood pressure. If you have a high blood pressure reading during one visit or you have normal blood pressure with other risk factors, you may be asked to: Return on a different day to have your blood pressure checked again. Monitor your blood pressure at home for 1 week or longer. If you are diagnosed with hypertension, you may have other blood or imaging tests to help your health care provider understand your overall risk for other conditions. How is this treated? This condition is treated by making healthy lifestyle changes, such as eating healthy foods, exercising more, and reducing your alcohol intake. Your health care provider may prescribe medicine if lifestyle changes are not enough to get your blood pressure under control, and if: Your systolic blood pressure is above 130. Your diastolic blood pressure is above 80. Your personal target blood pressure may vary depending on your medical conditions, your age, and other factors. Follow these instructions at home: Eating and drinking  Eat a diet that is high in fiber and potassium, and low in sodium, added sugar, and fat. An example eating plan is called the DASH (Dietary Approaches to Stop Hypertension) diet. To eat this way: Eat plenty of fresh fruits and vegetables. Try to fill one half of your plate at each meal with fruits and vegetables. Eat whole grains, such as whole-wheat pasta, brown rice, or whole-grain bread. Fill about one fourth of your plate with whole grains. Eat or drink low-fat dairy products, such as skim milk or low-fat yogurt. Avoid fatty cuts of meat, processed or cured meats, and  poultry with skin. Fill about one fourth of your plate with lean proteins, such as fish, chicken without skin, beans, eggs, or tofu. Avoid pre-made and processed foods. These tend to be higher in sodium, added sugar, and fat. Reduce your daily sodium intake. Most people with hypertension should eat less than 1,500 mg of sodium a day. Do not drink alcohol if: Your health care provider tells you not to drink. You are pregnant, may be pregnant, or are planning to become pregnant. If you drink alcohol: Limit how much you use to: 0-1 drink a day for women. 0-2 drinks a day for men. Be aware of how much alcohol is in your drink. In the [place] one drink equals one 12 oz bottle of beer (355 mL), one 5 oz glass of wine (148 mL), or one 1 oz glass of hard liquor (44 mL). Lifestyle  Work with your health care provider to maintain a healthy body weight or to lose weight. Ask what an ideal weight is for you. Get at least 30 minutes of exercise most days of the week. Activities may include walking, swimming, or biking. Include exercise to strengthen your muscles (resistance exercise), such as Pilates or lifting weights, as part of your weekly exercise routine. Try to do these types of exercises for 30 minutes at least 3 days a week. Do not use any products that contain nicotine or tobacco, such as cigarettes, e-cigarettes, and chewing tobacco. If you need help quitting, ask your health care provider. Monitor your blood pressure at home as told by your health care provider. Keep all follow-up visits as told by your health care provider. This is important. Medicines Take over-the-counter and prescription medicines only as told by your health care  provider. Follow directions carefully. Blood pressure medicines must be taken as prescribed. Do not skip doses of blood pressure medicine. Doing this puts you at risk for problems and can make the medicine less effective. Ask your health care provider about side  effects or reactions to medicines that you should watch for. Contact a health care provider if you: Think you are having a reaction to a medicine you are taking. Have headaches that keep coming back (recurring). Feel dizzy. Have swelling in your ankles. Have trouble with your vision. Get help right away if you: Develop a severe headache or confusion. Have unusual weakness or numbness. Feel faint. Have severe pain in your chest or abdomen. Vomit repeatedly. Have trouble breathing. Summary Hypertension is when the force of blood pumping through your arteries is too strong. If this condition is not controlled, it may put you at risk for serious complications. Your personal target blood pressure may vary depending on your medical conditions, your age, and other factors. For most people, a normal blood pressure is less than 120/80. Hypertension is treated with lifestyle changes, medicines, or a combination of both. Lifestyle changes include losing weight, eating a healthy, low-sodium diet, exercising more, and limiting alcohol. This information is not intended to replace advice given to you by your health care provider. Make sure you discuss any questions you have with your health care provider. Document Revised: 03/02/2018 Document Reviewed: 03/02/2018 Elsevier Patient Education  2022 [place], MD [facility]

## 2021-08-25 NOTE — Assessment & Plan Note (Signed)
Well-controlled hypertension.  Continue carvedilol 18.75 mg daily.

## 2021-08-25 NOTE — Patient Instructions (Signed)

## 2021-08-25 NOTE — Assessment & Plan Note (Signed)
Cardiovascular and cancer risk associated with smoking discussed. Smoking cessation advice given. 

## 2021-08-25 NOTE — Assessment & Plan Note (Signed)
Clinically euthyroid.  On no medications.

## 2021-08-25 NOTE — Assessment & Plan Note (Signed)
Stable.  Clinically euvolemic.  No signs or symptoms of acute CHF.

## 2021-08-25 NOTE — Assessment & Plan Note (Signed)
No clinical concerns for bleeding.  Continue Xarelto 20 mg daily.  Fall precautions given.

## 2021-11-12 ENCOUNTER — Other Ambulatory Visit: Payer: Self-pay | Admitting: Emergency Medicine

## 2021-11-12 DIAGNOSIS — I420 Dilated cardiomyopathy: Secondary | ICD-10-CM

## 2021-11-21 ENCOUNTER — Telehealth: Payer: Self-pay | Admitting: Emergency Medicine

## 2021-11-21 NOTE — Telephone Encounter (Signed)
Left message for patient to call back to schedule Medicare Annual Wellness Visit   No hx of AWV eligible as of 10/05/10  Please schedule at anytime with [facility] Advisor if patient calls the office back.    31 Minutes appointment   Any questions, please call me at [phone]

## 2022-02-10 ENCOUNTER — Other Ambulatory Visit: Payer: Self-pay | Admitting: Emergency Medicine

## 2022-02-10 DIAGNOSIS — I420 Dilated cardiomyopathy: Secondary | ICD-10-CM

## 2022-02-23 ENCOUNTER — Ambulatory Visit: Payer: Medicare Other | Admitting: Emergency Medicine

## 2022-03-29 ENCOUNTER — Other Ambulatory Visit: Payer: Self-pay | Admitting: Emergency Medicine

## 2022-03-29 DIAGNOSIS — R399 Unspecified symptoms and signs involving the genitourinary system: Secondary | ICD-10-CM

## 2022-04-30 ENCOUNTER — Other Ambulatory Visit: Payer: Self-pay | Admitting: Emergency Medicine

## 2022-04-30 DIAGNOSIS — I420 Dilated cardiomyopathy: Secondary | ICD-10-CM

## 2022-05-01 ENCOUNTER — Telehealth: Payer: Self-pay

## 2022-05-01 NOTE — Telephone Encounter (Signed)
[facility] home to inform them that Dr. Mitchel Honour is not in the office today. He will be back on 09/28/2022 and will sign the death certificate then.

## 2022-05-01 NOTE — Telephone Encounter (Signed)
[facility] called, patient passed on May 01, 2023. They need Dr.Sagardia to sign the death certificate.

## 2022-05-06 DEATH — deceased
